# Patient Record
Sex: Male | Born: 1940 | Race: Black or African American | Hispanic: No | Smoking: Former smoker
Health system: Southern US, Community
[De-identification: ages and names within clinical notes are randomized; demographics above are authoritative.]

## PROBLEM LIST (undated history)

## (undated) DIAGNOSIS — I2510 Atherosclerotic heart disease of native coronary artery without angina pectoris: Secondary | ICD-10-CM

## (undated) DIAGNOSIS — I219 Acute myocardial infarction, unspecified: Secondary | ICD-10-CM

## (undated) DIAGNOSIS — R413 Other amnesia: Secondary | ICD-10-CM

## (undated) DIAGNOSIS — R2240 Localized swelling, mass and lump, unspecified lower limb: Secondary | ICD-10-CM

## (undated) DIAGNOSIS — N429 Disorder of prostate, unspecified: Secondary | ICD-10-CM

## (undated) DIAGNOSIS — K219 Gastro-esophageal reflux disease without esophagitis: Secondary | ICD-10-CM

## (undated) DIAGNOSIS — F0390 Unspecified dementia without behavioral disturbance: Secondary | ICD-10-CM

## (undated) DIAGNOSIS — E785 Hyperlipidemia, unspecified: Secondary | ICD-10-CM

## (undated) DIAGNOSIS — Z87891 Personal history of nicotine dependence: Secondary | ICD-10-CM

## (undated) DIAGNOSIS — Z95820 Peripheral vascular angioplasty status with implants and grafts: Secondary | ICD-10-CM

## (undated) HISTORY — DX: Hyperlipidemia, unspecified: E78.5

## (undated) HISTORY — DX: Other amnesia: R41.3

## (undated) HISTORY — DX: Acute myocardial infarction, unspecified: I21.9

## (undated) HISTORY — DX: Peripheral vascular angioplasty status with implants and grafts: Z95.820

## (undated) HISTORY — DX: Atherosclerotic heart disease of native coronary artery without angina pectoris: I25.10

## (undated) HISTORY — PX: CORONARY STENT PLACEMENT: SHX1402

## (undated) HISTORY — DX: Localized swelling, mass and lump, unspecified lower limb: R22.40

## (undated) HISTORY — DX: Personal history of nicotine dependence: Z87.891

## (undated) HISTORY — DX: Gastro-esophageal reflux disease without esophagitis: K21.9

## (undated) HISTORY — DX: Unspecified dementia, unspecified severity, without behavioral disturbance, psychotic disturbance, mood disturbance, and anxiety: F03.90

---

## 1999-06-29 ENCOUNTER — Emergency Department (HOSPITAL_COMMUNITY): Admission: EM | Admit: 1999-06-29 | Discharge: 1999-06-29 | Payer: Self-pay | Admitting: Emergency Medicine

## 2003-07-08 ENCOUNTER — Encounter: Payer: Self-pay | Admitting: Emergency Medicine

## 2003-07-08 ENCOUNTER — Emergency Department (HOSPITAL_COMMUNITY): Admission: EM | Admit: 2003-07-08 | Discharge: 2003-07-08 | Payer: Self-pay | Admitting: Emergency Medicine

## 2003-07-14 ENCOUNTER — Emergency Department (HOSPITAL_COMMUNITY): Admission: EM | Admit: 2003-07-14 | Discharge: 2003-07-14 | Payer: Self-pay | Admitting: Emergency Medicine

## 2004-11-19 ENCOUNTER — Ambulatory Visit: Payer: Self-pay | Admitting: Internal Medicine

## 2005-09-29 ENCOUNTER — Ambulatory Visit: Payer: Self-pay | Admitting: Family Medicine

## 2007-01-04 ENCOUNTER — Ambulatory Visit: Payer: Self-pay | Admitting: Family Medicine

## 2007-02-08 DIAGNOSIS — K219 Gastro-esophageal reflux disease without esophagitis: Secondary | ICD-10-CM

## 2007-10-11 ENCOUNTER — Ambulatory Visit (HOSPITAL_COMMUNITY): Admission: RE | Admit: 2007-10-11 | Discharge: 2007-10-11 | Payer: Self-pay | Admitting: Family Medicine

## 2007-10-11 ENCOUNTER — Ambulatory Visit: Payer: Self-pay | Admitting: Family Medicine

## 2007-10-11 LAB — CONVERTED CEMR LAB
CO2: 28 meq/L (ref 19–32)
Calcium: 10.1 mg/dL (ref 8.4–10.5)
MCHC: 30.5 g/dL (ref 30.0–36.0)
MCV: 91.5 fL (ref 78.0–100.0)
Platelets: 223 10*3/uL (ref 150–400)
Potassium: 4.6 meq/L (ref 3.5–5.3)
RBC: 4.97 M/uL (ref 4.22–5.81)
Sodium: 143 meq/L (ref 135–145)
TSH: 1.445 microintl units/mL (ref 0.350–5.50)
WBC: 7.5 10*3/uL (ref 4.0–10.5)

## 2007-10-16 ENCOUNTER — Encounter: Payer: Self-pay | Admitting: Family Medicine

## 2007-10-17 ENCOUNTER — Encounter: Payer: Self-pay | Admitting: Family Medicine

## 2008-11-05 ENCOUNTER — Ambulatory Visit: Payer: Self-pay | Admitting: Family Medicine

## 2008-11-05 ENCOUNTER — Encounter: Admission: RE | Admit: 2008-11-05 | Discharge: 2008-11-05 | Payer: Self-pay | Admitting: Family Medicine

## 2008-11-06 ENCOUNTER — Telehealth: Payer: Self-pay | Admitting: Family Medicine

## 2008-11-06 ENCOUNTER — Encounter: Payer: Self-pay | Admitting: Family Medicine

## 2008-11-20 ENCOUNTER — Ambulatory Visit: Payer: Self-pay | Admitting: Family Medicine

## 2008-11-20 DIAGNOSIS — Z87891 Personal history of nicotine dependence: Secondary | ICD-10-CM

## 2008-11-20 HISTORY — DX: Personal history of nicotine dependence: Z87.891

## 2008-11-20 LAB — CONVERTED CEMR LAB
Cholesterol: 194 mg/dL (ref 0–200)
HDL: 60 mg/dL (ref >39)
Triglycerides: 76 mg/dL (ref <150)

## 2008-11-21 ENCOUNTER — Encounter: Payer: Self-pay | Admitting: Family Medicine

## 2009-04-16 ENCOUNTER — Ambulatory Visit: Payer: Self-pay | Admitting: Family Medicine

## 2009-07-09 ENCOUNTER — Ambulatory Visit: Payer: Self-pay | Admitting: Family Medicine

## 2009-07-09 DIAGNOSIS — M25569 Pain in unspecified knee: Secondary | ICD-10-CM | POA: Insufficient documentation

## 2009-07-09 DIAGNOSIS — R229 Localized swelling, mass and lump, unspecified: Secondary | ICD-10-CM

## 2009-08-24 ENCOUNTER — Encounter (INDEPENDENT_AMBULATORY_CARE_PROVIDER_SITE_OTHER): Payer: Self-pay | Admitting: General Practice

## 2009-09-17 ENCOUNTER — Encounter: Payer: Self-pay | Admitting: Gastroenterology

## 2009-10-16 ENCOUNTER — Ambulatory Visit: Payer: Self-pay | Admitting: Gastroenterology

## 2009-10-26 ENCOUNTER — Ambulatory Visit: Payer: Self-pay | Admitting: Gastroenterology

## 2009-12-25 ENCOUNTER — Ambulatory Visit: Payer: Self-pay | Admitting: Family Medicine

## 2010-01-18 ENCOUNTER — Encounter: Payer: Self-pay | Admitting: Family Medicine

## 2010-10-01 ENCOUNTER — Ambulatory Visit: Payer: Self-pay | Admitting: Family Medicine

## 2011-01-03 ENCOUNTER — Encounter (INDEPENDENT_AMBULATORY_CARE_PROVIDER_SITE_OTHER): Payer: Self-pay | Admitting: *Deleted

## 2011-01-11 NOTE — Assessment & Plan Note (Signed)
Summary: SINUS INFECTION/CONGESTED X 1 WK/CHAMBLISS' PT/D-LO FATHER-IN...   Vital Signs:  Patient profile:   70 year old male Weight:      140.8 pounds Temp:     98.8 degrees F oral Pulse rate:   75 / minute Pulse rhythm:   regular BP sitting:   118 / 77  (left arm) Cuff size:   regular  Vitals Entered By: Loralee Pacas CMA (December 25, 2009 11:48 AM) CC: congestion Comments pt states that he has pressure in his head and nasal drainage thats green.   CC:  congestion.  History of Present Illness: 1.  congestion--also runny nose, sinus pressure for one week.  starting to feel a little better today.  taking some otc med, but does not remember the name.  no cough, sore throat or fever.  would like flu shot.  Allergies: No Known Drug Allergies  Past History:  Past Medical History: Reviewed history from 10/11/2007 and no changes required. EST approximately 2002  Physical Exam  General:  pleasant, well-groomed Eyes:  normal appearance Ears:  External ear exam shows no significant lesions or deformities.  Otoscopic examination reveals clear canals, tympanic membranes are intact bilaterally without bulging, retraction, inflammation or discharge. Hearing is grossly normal bilaterally. Nose:  mucosal erythema Mouth:  MMM.  Oral mucosa and oropharynx without lesions or exudates.  dentures in place. Neck:  mild shoddy anterior lad Lungs:  Normal respiratory effort, chest expands symmetrically. Lungs are clear to auscultation, no crackles or wheezes. Heart:  Normal rate and regular rhythm. S1 and S2 normal without gallop, murmur, click, rub or other extra sounds. Additional Exam:  vital signs reviewed    Impression & Recommendations:  Problem # 1:  UPPER RESPIRATORY INFECTION (ICD-465.9) Assessment New  supportive care.  see pt instructions.  Orders: [facility]- Est Level  3 (16109)  Patient Instructions: 1)  It was nice to see you today. 2)  Tell Delores that we miss her! 3)  I  think you have a cold.  You should start feeling better in the next few days. 4)  In the meantime, you can take sudafed (pseudoephedrine) for your congestion. 5)  You can take zyrtec (cetirizine) for your runny nose.   6)  Also, drink plenty of fluids. 7)  If you do not feel better in a week, or if your symptoms get worse, make a follow up appointment.   Appended Document: SINUS INFECTION/CONGESTED X 1 WK/CHAMBLISS' PT/D-LO FATHER-IN...     Allergies: No Known Drug Allergies   Other Orders: Influenza Vaccine MCR (60454)   Immunizations Administered:  Influenza Vaccine # 1:    Vaccine Type: Fluvax MCR    Site: right deltoid    Mfr: GlaxoSmithKline    Dose: 0.5 ml    Route: IM    Given by: Loralee Pacas CMA    Exp. Date: 06/10/2010    Lot #: [id number]    VIS given: 07/21/2009  Flu Vaccine Consent Questions:    Do you have a history of severe allergic reactions to this vaccine? no    Any prior history of allergic reactions to egg and/or gelatin? no    Do you have a sensitivity to the preservative Thimersol? no    Do you have a past history of Guillan-Barre Syndrome? no    Do you currently have an acute febrile illness? no    Have you ever had a severe reaction to latex? no    Vaccine information given and explained to patient? yes

## 2011-01-11 NOTE — Miscellaneous (Signed)
Summary: CP  Clinical Lists Changes CP & pain under L arm off & on x 1 wk. nothing makes it better of worse. feels a burning sensation as well in chest area. hx of reflux but takes no meds. he called his dtr in law & asked for an appt. this is something he would not normally do she states. reviewed problem list & told her that he needs to go to ED to be evaluated. she will encourage him to go now.Golden Circle RN  January 18, 2010 11:50 AM

## 2011-01-11 NOTE — Assessment & Plan Note (Signed)
Summary: flu shot/bmc  Nurse Visit   Vital Signs:  Patient profile:   70 year old male Temp:     98.3 degrees F  Vitals Entered By: Theresia Lo RN (October 01, 2010 10:14 AM)  Allergies: No Known Drug Allergies  Immunizations Administered:  Influenza Vaccine # 1:    Vaccine Type: Fluvax MCR    Site: left deltoid    Mfr: GlaxoSmithKline    Dose: 0.5 ml    Given by: Theresia Lo RN    Exp. Date: 06/08/2011    Lot #: [id number]    VIS given: 07/06/10 version given October 01, 2010.  Flu Vaccine Consent Questions:    Do you have a history of severe allergic reactions to this vaccine? no    Any prior history of allergic reactions to egg and/or gelatin? no    Do you have a sensitivity to the preservative Thimersol? no    Do you have a past history of Guillan-Barre Syndrome? no    Do you currently have an acute febrile illness? no    Have you ever had a severe reaction to latex? no    Vaccine information given and explained to patient? yes  Orders Added: 1)  Influenza Vaccine MCR [00025] 2)  Administration Flu vaccine - MCR [G0008]

## 2011-01-13 NOTE — Letter (Signed)
Summary: Generic Letter  [facility]  [address]   [place], [place] [address]   Phone: [phone]  Fax: [phone]     01/03/2011  [address] [place], [place]  [address]  Dear Mr. Rede,  We are happy to let you know that since you are covered under Medicare you are able to have a FREE visit at the [facility] to discuss your HEALTH. This is a new benefit for Medicare.  There will be no co-payment.  At this visit you will meet with Arlys John an expert in wellness and the health coach at our clinic.  At this visit we will discuss ways to keep you healthy and feeling well.  This visit will not replace your regular doctor visit and we cannot refill medications.     You will need to plan to be here at least one hour to talk about your medical history, your current status, review all of your medications, and discuss your future plans for your health.  This information will be entered into your record for your doctor to have and review.  If you are interested in staying healthy, this type of visit can help.  Please call the office at: [phone], to schedule a "Medicare Wellness Visit".  The day of the visit you should bring in all of your medications, including any vitamins, herbs, over the counter products you take.  Make a list of all the other doctors that you see, so we know who they are. If you have any other health documents please bring them.  We look forward to helping you stay healthy.   Sincerely,   [facility]  iAWV

## 2011-01-14 ENCOUNTER — Telehealth: Payer: Self-pay | Admitting: Family Medicine

## 2011-01-14 ENCOUNTER — Inpatient Hospital Stay (HOSPITAL_COMMUNITY)
Admission: RE | Admit: 2011-01-14 | Discharge: 2011-01-14 | Disposition: A | Discharge status: Home / Self Care | Payer: Medicare Other | Source: Ambulatory Visit | Attending: Emergency Medicine | Admitting: Emergency Medicine

## 2011-01-14 DIAGNOSIS — H81399 Other peripheral vertigo, unspecified ear: Secondary | ICD-10-CM

## 2011-01-19 NOTE — Progress Notes (Signed)
Summary: triage  Phone Note Call from Patient Call back at [phone]   Caller: delores Summary of Call: head swimming h/a - bp elevated Initial call taken by: De Nurse,  January 14, 2011 2:15 PM  Follow-up for Phone Call        Deloris states he has been not well this week with HA & elevated bp. does not have any readings. asked her to get him to UC today as he needs to be seen right away & we are out of appts. she agreed with the plan Follow-up by: Golden Circle RN,  January 14, 2011 2:21 PM

## 2011-02-03 ENCOUNTER — Ambulatory Visit: Payer: Medicare Other | Admitting: Family Medicine

## 2011-02-09 ENCOUNTER — Ambulatory Visit (INDEPENDENT_AMBULATORY_CARE_PROVIDER_SITE_OTHER): Payer: Medicare Other | Admitting: Family Medicine

## 2011-02-09 ENCOUNTER — Ambulatory Visit: Payer: Medicare Other | Admitting: Family Medicine

## 2011-02-09 ENCOUNTER — Encounter: Payer: Self-pay | Admitting: Family Medicine

## 2011-02-09 VITALS — BP 136/78 | HR 81 | Temp 98.6°F | Ht 63.5 in | Wt 137.3 lb

## 2011-02-09 DIAGNOSIS — R42 Dizziness and giddiness: Secondary | ICD-10-CM

## 2011-02-09 MED ORDER — MECLIZINE HCL 25 MG PO TABS: 25.0000 mg | ORAL_TABLET | Freq: Three times a day (TID) | ORAL | Status: DC | PRN

## 2011-02-09 MED ORDER — MECLIZINE HCL 25 MG PO TABS: 25.0000 mg | ORAL_TABLET | Freq: Three times a day (TID) | ORAL | Status: AC | PRN

## 2011-02-09 NOTE — Patient Instructions (Signed)
It was nice to meet you today!  You have vertigo.  I sent you prescriptions to your pharmacy.
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
## 2011-02-09 NOTE — Assessment & Plan Note (Signed)
Vertigo induced when patient instructed to move head side to side. Discussed Dx, Tx, and prognosis. Follow up in 2-3 weeks if not improving. No red flags today. No need for further evaluation at this time.

## 2011-03-02 ENCOUNTER — Encounter: Payer: Self-pay | Admitting: Family Medicine

## 2011-03-02 ENCOUNTER — Ambulatory Visit (INDEPENDENT_AMBULATORY_CARE_PROVIDER_SITE_OTHER): Payer: Medicare Other | Admitting: Family Medicine

## 2011-03-02 DIAGNOSIS — F17200 Nicotine dependence, unspecified, uncomplicated: Secondary | ICD-10-CM

## 2011-03-02 DIAGNOSIS — R42 Dizziness and giddiness: Secondary | ICD-10-CM

## 2011-03-02 NOTE — Assessment & Plan Note (Signed)
Most consistent with  Peripheral vertigo.  No signs of intracranial lesions or stroke. Continue current treatment

## 2011-03-02 NOTE — Patient Instructions (Signed)
I think your inner ear is causing the dizziness If it is not gone in 2 months or getting worse or you have any weakness or vision changes call us immediately If you have chest pain when you are working hard or if you have pain that does not get better with coke or antiacids then call us Consider stop smoking  Come back in one year

## 2011-03-02 NOTE — Progress Notes (Signed)
  Subjective:    Patient ID: MALIQ PILLEY, male    DOB: [birth year], 70 y.o.   MRN: [id number]  HPI  Vertigo Has been going on for a month or so. Saw Dr Earlene Plater I reviewed this note.  He is generally feeling better.  Has episodes of vertigo but are lessening. No syncope or focal weakness or visual changes.  Takes meclizine a few time a week.  Tobacco Smokes 1 ppd.  Would like to stop. Has tried nicotine gum in past.   Realizes how much money it costs   Review of Systems  See above     Objective:   Physical Exam Heart:  Normal rate and regular rhythm. S1 and S2 normal without gallop, murmur, click, rub or other extra sounds Eye - Pupils Equal Round Reactive to light, Extraocular movements intact, fundi without hemorrhage or visible lesions Lungs:  Normal respiratory effort, chest expands symmetrically. Lungs are clear to auscultation, no crackles or wheezes. Neurologic exam Cn 2-7 intact; Able to walk on heels and shins.  Normal grip and upper extremity strength Balance Romberg normal,  Ears:  External ear exam shows no significant lesions or deformities.  Otoscopic examination reveals clear canals, tympanic membranes are intact bilaterally without bulging, retraction, inflammation or discharge. Hearing is grossly normal bilaterall     Assessment & Plan:

## 2011-03-02 NOTE — Assessment & Plan Note (Addendum)
Discussed his current use.  Went over reasons to stop and cost of buying cigarrettes.  Also discussed approaches to stop

## 2011-06-28 ENCOUNTER — Ambulatory Visit (INDEPENDENT_AMBULATORY_CARE_PROVIDER_SITE_OTHER): Payer: Medicare Other | Admitting: Family Medicine

## 2011-06-28 ENCOUNTER — Encounter: Payer: Self-pay | Admitting: Family Medicine

## 2011-06-28 VITALS — BP 106/65 | HR 55 | Temp 98.5°F | Ht 66.0 in | Wt 137.8 lb

## 2011-06-28 DIAGNOSIS — R229 Localized swelling, mass and lump, unspecified: Secondary | ICD-10-CM

## 2011-06-28 DIAGNOSIS — F17200 Nicotine dependence, unspecified, uncomplicated: Secondary | ICD-10-CM

## 2011-06-28 DIAGNOSIS — R2240 Localized swelling, mass and lump, unspecified lower limb: Secondary | ICD-10-CM | POA: Insufficient documentation

## 2011-06-28 MED ORDER — BUPROPION HCL ER (SMOKING DET) 150 MG PO TB12: 150.0000 mg | ORAL_TABLET | Freq: Two times a day (BID) | ORAL | Status: AC

## 2011-06-28 NOTE — Progress Notes (Signed)
  Subjective:    Patient ID: Dale [name], male    DOB: Jun 24, 1941, 70 y.o.   MRN: [id number]  HPI  Mass R Leg Has had a swelling in the front of his shin for many years.  Usually does not bother him.  Over the last few days it has been painful, sharp shooting severe pain that comes and goes worse when he squeezes it  Certain way but not all the time.  No redness or discharge or fever or other pain in his leg.  Is somewhat bigger than usual  Smoking Would like to stop.  His friends stopped with Zyban.  He smokes 1ppd of menthol cigarettes.  Has used nicotine gum before. Feels it is bad for his health and costs too much.  Review of Symptoms - see HPI  PMH - Smoking status noted.     Review of Systems     Objective:   Physical Exam  R Anterior mid shin - 1.5 x 1.5 cm superficial soft mass that seems to be separate from underlying muscle. Is not tender to palpation.  No pain with ROM of hip, knee, ankle or SLR.  Is tender sometimes when he moves it laterally.  Can walk on his toes and heels without pain       Assessment & Plan:

## 2011-06-28 NOTE — Patient Instructions (Signed)
We will refer you to a surgeon for removal of the mass  If it gets really swollen or painful call us  Randie Heinz you want to stop smoking  Take the Zyban (buproprion) 1 tab twice daily.  After 2 weeks stop smoking and continue to take the medicine for another 2-4 weeks.  To get ready to stop Choose a quit date that is important to you Go a few hours without smoking Change to a brand you don't like Find a substitute to use when you feel like a cigarette On your Quit date - throw away all your cigarettes, ashtrays  Tell all your friends and enemies you are stopping

## 2011-06-28 NOTE — Assessment & Plan Note (Signed)
Unsure of etiology or why is painful now.  No signs of acute infection.  Will refer for evaluation and probable removal

## 2011-06-28 NOTE — Assessment & Plan Note (Addendum)
Interested in stopping.  Counseled on approach.  Will Rx Zyban.  No histrory of Sz

## 2011-06-29 ENCOUNTER — Ambulatory Visit: Payer: No Typology Code available for payment source | Admitting: Family Medicine

## 2011-07-15 ENCOUNTER — Encounter (INDEPENDENT_AMBULATORY_CARE_PROVIDER_SITE_OTHER): Payer: Self-pay | Admitting: General Surgery

## 2011-07-15 ENCOUNTER — Ambulatory Visit (INDEPENDENT_AMBULATORY_CARE_PROVIDER_SITE_OTHER): Payer: Medicare Other | Admitting: General Surgery

## 2011-07-15 VITALS — BP 112/66 | HR 58 | Temp 96.5°F | Ht 65.0 in | Wt 134.2 lb

## 2011-07-15 DIAGNOSIS — L720 Epidermal cyst: Secondary | ICD-10-CM

## 2011-07-15 DIAGNOSIS — L723 Sebaceous cyst: Secondary | ICD-10-CM

## 2011-07-15 NOTE — Progress Notes (Signed)
Dale [name] is a 70 y.o. male.    Chief Complaint  Patient presents with  . Other    new pt-eval of shin mass    HPI HPI The patient was referred for evaluation of a mass on his right shin which he states has been present all his life. He has not noticed any certain changes over this time period until recently when he began having some sharp, shooting pains in the area of the mass and he states that it has increased in size. It was tender to touch and swollen but he denies any fevers or chills, redness, or discharge. He denies any other associated symptoms or relieving or exacerbating factors. The pain continued for 2 days and then spontaneously resolved.  Past Medical History  Diagnosis Date  . Leg mass     rt    History reviewed. No pertinent past surgical history.  History reviewed. No pertinent family history.  Social History History  Substance Use Topics  . Smoking status: Current Everyday Smoker -- 1.0 packs/day for 1 years    Types: Cigarettes  . Smokeless tobacco: Not on file  . Alcohol Use: Yes    No Known Allergies  Current Outpatient Prescriptions  Medication Sig Dispense Refill  . fish oil-omega-3 fatty acids 1000 MG capsule Take 1 g by mouth daily.        . Multiple Vitamin (MULTI-VITAMIN PO) Take by mouth daily.        Marland Kitchen buPROPion (ZYBAN) 150 MG 12 hr tablet Take 1 tablet (150 mg total) by mouth 2 (two) times daily.  60 tablet  1    Review of Systems Review of Systems  Constitutional: Negative.   HENT: Negative.   Eyes: Negative.   Respiratory: Negative.   Cardiovascular: Negative.   Gastrointestinal: Negative.   Genitourinary: Negative.   Musculoskeletal: Negative.   Skin: Negative.   Neurological: Negative.   Endo/Heme/Allergies: Negative.     Physical Exam Physical Exam  Constitutional: He is oriented to person, place, and time. He appears well-developed and well-nourished. No distress.  HENT:  Head: Normocephalic and atraumatic.    Mouth/Throat: No oropharyngeal exudate.  Eyes: Conjunctivae and EOM are normal. Pupils are equal, round, and reactive to light. Right eye exhibits no discharge. Left eye exhibits no discharge. No scleral icterus.  Neck: Normal range of motion. Neck supple. No tracheal deviation present.  Cardiovascular: Normal rate, regular rhythm and normal heart sounds.   Respiratory: Effort normal and breath sounds normal. No stridor. No respiratory distress. He has no wheezes.  GI: Soft. Bowel sounds are normal. He exhibits no distension and no mass. There is no tenderness. There is no rebound and no guarding.  Musculoskeletal: Normal range of motion. He exhibits no edema and no tenderness.  Neurological: He is alert and oriented to person, place, and time. He has normal reflexes.  Skin: Skin is warm and dry. No rash noted. He is not diaphoretic. No erythema. No pallor.       3cm x 3cm mobile mass on anterior right shin, central skin punctum consistent with epidermal inclusion cyst, nontender and no evidence of infection today. No other significant lesions.  Psychiatric: He has a normal mood and affect. His behavior is normal. Judgment and thought content normal.     Blood pressure 112/66, pulse 58, temperature 96.5 F (35.8 C), height 5\' 5"  (1.651 m), weight 134 lb 4 oz (60.895 kg).  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 This is nontender currently and has been present his whole life. I discussed with him the natural history of sebaceous cysts and discussed the risks of possible infection. I offered excision versus continued observation and he is currently undecided. I gave him the contact information and recommended that he think about the options and if he decides to have this removed for treatment and official diagnosis then he can call me back and we will schedule this at his convenience.  Otherwise, if he decides to  pursue observation, I did discuss with him the possibility of infection within the cyst which would require incision and drainage, wound care, and subsequent formal excision for definitive treatment. He expressed understanding.  Dale [name] 07/15/2011, 11:52 AM

## 2011-10-18 ENCOUNTER — Encounter: Payer: Self-pay | Admitting: Home Health Services

## 2011-10-31 ENCOUNTER — Ambulatory Visit (INDEPENDENT_AMBULATORY_CARE_PROVIDER_SITE_OTHER): Payer: Medicare Other | Admitting: Family Medicine

## 2011-10-31 ENCOUNTER — Encounter: Payer: Self-pay | Admitting: Family Medicine

## 2011-10-31 VITALS — BP 120/70 | HR 73 | Temp 98.1°F | Ht 64.0 in | Wt 136.0 lb

## 2011-10-31 DIAGNOSIS — Z23 Encounter for immunization: Secondary | ICD-10-CM

## 2011-10-31 MED ORDER — NAPROXEN 500 MG PO TABS: 500.0000 mg | ORAL_TABLET | Freq: Two times a day (BID) | ORAL | Status: DC

## 2011-10-31 MED ORDER — BUPROPION HCL ER (SR) 150 MG PO TB12: 150.0000 mg | ORAL_TABLET | Freq: Two times a day (BID) | ORAL | Status: DC
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
## 2012-08-15 ENCOUNTER — Ambulatory Visit: Payer: Self-pay | Admitting: Family Medicine

## 2012-08-22 ENCOUNTER — Encounter: Payer: Self-pay | Admitting: Family Medicine

## 2012-08-22 ENCOUNTER — Ambulatory Visit (INDEPENDENT_AMBULATORY_CARE_PROVIDER_SITE_OTHER): Payer: PRIVATE HEALTH INSURANCE | Admitting: Family Medicine

## 2012-08-22 VITALS — BP 113/73 | HR 50 | Ht 64.0 in | Wt 145.0 lb

## 2012-08-22 DIAGNOSIS — Z23 Encounter for immunization: Secondary | ICD-10-CM

## 2012-08-22 DIAGNOSIS — Z87891 Personal history of nicotine dependence: Secondary | ICD-10-CM

## 2012-08-22 DIAGNOSIS — I498 Other specified cardiac arrhythmias: Secondary | ICD-10-CM

## 2012-08-22 NOTE — Assessment & Plan Note (Signed)
In Nov 2012

## 2012-08-22 NOTE — Progress Notes (Signed)
  Subjective:    Patient ID: Dale [name], male    DOB: 09-Jan-1941, 71 y.o.   MRN: [id number]  HPI  For Physical  Feels well.    No complaints  Stopped smoking last November Wil have an occasional e cigarette.  ROS  General:  Negative for nexplained weight loss, fever Skin: Negative for new or changing mole, sore that won't heal HEENT: Negative for trouble hearing, trouble seeing, ringing in ears, mouth sores, hoarseness, change in voice, dysphagia. CV:  Negative for chest pain, dyspnea, edema, palpitations Resp: Negative for cough, dyspnea, hemoptysis GI: Negative for nausea, vomiting, diarrhea, constipation, abdominal pain, melena, hematochezia. GU: Negative for dysuria, incontinence,  hematuria,  penile discharge, polyuria, sexual difficulty, lumps in testicle or breasts MSK: Negative for muscle cramps or aches, joint pain or swelling Neuro: Negative for headaches, weakness, numbness, dizziness, passing out/fainting Psych: Negative for depression, anxiety, memory problems  Does notice a decrease in urinary FOS and nocturia 1-2 times a night does not interfere with his fucniton  Review of Systems     Objective:   Physical Exam  Neck:  No deformities, thyromegaly, masses, or tenderness noted.   Supple with full range of motion without pain. Heart - Regular rate and rhythm.  No murmurs, gallops or rubs.    Lungs:  Normal respiratory effort, chest expands symmetrically. Lungs are clear to auscultation, no crackles or wheezes. Abdomen: soft and non-tender without masses, organomegaly or hernias noted.  No guarding or rebound Extremities:  No cyanosis, edema, or deformity noted with good range of motion of all major joints.   Has the 2.5x2 soft mass on anterior left shin - unchanged from previously Skin:  Intact without suspicious lesions or rashes       Assessment & Plan:

## 2012-08-22 NOTE — Patient Instructions (Addendum)
Call me if passing your water is causing more problems.  Beware of certain cold medications - check to see if they will affect your Prostate  Congrats on stopping smoking  Check on your shingles shot

## 2012-08-22 NOTE — Assessment & Plan Note (Signed)
No symptoms 

## 2012-12-26 ENCOUNTER — Encounter: Payer: Self-pay | Admitting: Family Medicine

## 2012-12-26 ENCOUNTER — Ambulatory Visit (INDEPENDENT_AMBULATORY_CARE_PROVIDER_SITE_OTHER): Payer: Medicare Other | Admitting: Family Medicine

## 2012-12-26 VITALS — BP 123/79 | HR 71 | Temp 98.3°F | Ht 64.0 in | Wt 151.0 lb

## 2012-12-26 DIAGNOSIS — R109 Unspecified abdominal pain: Secondary | ICD-10-CM | POA: Insufficient documentation

## 2012-12-26 LAB — CBC
HCT: 44.8 % (ref 39.0–52.0)
Hemoglobin: 14.6 g/dL (ref 13.0–17.0)
MCH: 28.5 pg (ref 26.0–34.0)
MCHC: 32.6 g/dL (ref 30.0–36.0)
MCV: 87.3 fL (ref 78.0–100.0)
RBC: 5.13 MIL/uL (ref 4.22–5.81)

## 2012-12-26 MED ORDER — SILDENAFIL CITRATE 100 MG PO TABS: 100.0000 mg | ORAL_TABLET | ORAL | Status: DC | PRN

## 2012-12-26 NOTE — Patient Instructions (Signed)
I think your abdomen pain will improve slowly.  Do not use Aleve or aspirin or drink alcohol.  You can take the Pepto Bismol as needed  If you have any severe pain or bleeding or fever or are not better in 2 weeks then come back  I will call you if your tests are not good.  Otherwise I will send you a letter.  If you do not hear from me with in 2 weeks please call our office.

## 2012-12-26 NOTE — Assessment & Plan Note (Signed)
New but seems to be improving without any red flags.  Will check a cbc to rule out bleeding ulcer or occult infection.  Will montior

## 2012-12-26 NOTE — Progress Notes (Signed)
  Subjective:    Patient ID: Dale [name], male    DOB: 1941-11-25, 72 y.o.   MRN: [id number]  HPI  Abdomen Rectal discomfort For last few months intermittently tender area around rectum.  No bleeding or severe pain.  Over last few days mild diffuse abdomen pain.  No nausea or vomiting or frank diarrhea but his bowel movements change an little in hardness.  No fever, No weight loss.  Takes occasional aleve and drinks moonshine and coffee and took some new little candies (cant remember name) that he thinks might have caused his symptoms.  No dysuria or frequency   PMH - had colonoscopy in 2010   Review of Symptoms - see HPI  PMH - Smoking status noted.     Review of Systems     Objective:   Physical Exam  No acute distress Abdomen: soft and non-tender without masses, organomegaly or hernias noted.  No guarding or rebound Rectal: normal size firm prostate without masses or irregularities.  No masses or tender areas or fissures.  Normal color stool   Back - Normal skin, Spine with normal alignment and no deformity.  No tenderness to vertebral process palpation.  Paraspinous muscles are not tender and without spasm.   Range of motion is full at neck and lumbar sacral regions       Assessment & Plan:

## 2012-12-27 ENCOUNTER — Encounter: Payer: Self-pay | Admitting: Family Medicine

## 2013-05-27 ENCOUNTER — Ambulatory Visit (INDEPENDENT_AMBULATORY_CARE_PROVIDER_SITE_OTHER): Payer: Medicare PPO | Admitting: Family Medicine

## 2013-05-27 ENCOUNTER — Encounter: Payer: Self-pay | Admitting: Family Medicine

## 2013-05-27 VITALS — BP 128/76 | HR 52 | Temp 97.8°F | Wt 147.0 lb

## 2013-05-27 DIAGNOSIS — Z13220 Encounter for screening for lipoid disorders: Secondary | ICD-10-CM

## 2013-05-27 LAB — LIPID PANEL
LDL Cholesterol: 106 mg/dL — ABNORMAL HIGH (ref 0–99)
Total CHOL/HDL Ratio: 2.9 Ratio
VLDL: 12 mg/dL (ref 0–40)

## 2013-05-27 MED ORDER — SILDENAFIL CITRATE 100 MG PO TABS: 100.0000 mg | ORAL_TABLET | ORAL | Status: DC | PRN

## 2013-05-27 NOTE — Progress Notes (Signed)
  Subjective:    Patient ID: Dale [name], male    DOB: [birth year], 72 y.o.   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  He is very active  Patient reports no  vision/ hearing changes,anorexia, weight change, fever ,adenopathy, persistant / recurrent hoarseness, swallowing issues, chest pain, edema,persistant / recurrent cough, hemoptysis, dyspnea(rest, exertional, paroxysmal nocturnal), gastrointestinal  bleeding (melena, rectal bleeding), abdominal pain, excessive heart burn, GU symptoms(dysuria, hematuria, pyuria, voiding/incontinence  Issues) syncope, focal weakness, severe memory loss, concerning skin lesions, depression, anxiety, abnormal bruising/bleeding, major joint swelling.      Review of Systems     Objective:   Physical Exam  Alert no acute distress Neck:  No deformities, thyromegaly, masses, or tenderness noted.   Supple with full range of motion without pain. Heart - Regular rate and rhythm.  No murmurs, gallops or rubs.    Lungs:  Normal respiratory effort, chest expands symmetrically. Lungs are clear to auscultation, no crackles or wheezes. Abdomen: soft and non-tender without masses, organomegaly or hernias noted.  No guarding or rebound Extremities:  No cyanosis, edema, or noted with good range of motion of all major joints.  Does have 3 cm soft mass on anter R shin - chronic         Assessment & Plan:   Normal Exam Discussed Shingles Shot

## 2013-05-27 NOTE — Patient Instructions (Addendum)
I will call you if your tests are not good.  Otherwise I will send you a letter.  If you do not hear from me with in 2 weeks please call our office.     Bring in a physical form if you need it filled out  Call you insurance to see about the [employer]

## 2013-05-30 ENCOUNTER — Encounter (HOSPITAL_COMMUNITY): Payer: Self-pay | Admitting: *Deleted

## 2013-05-30 ENCOUNTER — Encounter: Payer: Self-pay | Admitting: Family Medicine

## 2013-05-30 DIAGNOSIS — Y9269 Other specified industrial and construction area as the place of occurrence of the external cause: Secondary | ICD-10-CM | POA: Insufficient documentation

## 2013-05-30 DIAGNOSIS — Y9389 Activity, other specified: Secondary | ICD-10-CM | POA: Insufficient documentation

## 2013-05-30 DIAGNOSIS — Z87891 Personal history of nicotine dependence: Secondary | ICD-10-CM | POA: Insufficient documentation

## 2013-05-30 DIAGNOSIS — X58XXXA Exposure to other specified factors, initial encounter: Secondary | ICD-10-CM | POA: Insufficient documentation

## 2013-05-30 DIAGNOSIS — S62609A Fracture of unspecified phalanx of unspecified finger, initial encounter for closed fracture: Secondary | ICD-10-CM | POA: Insufficient documentation

## 2013-05-30 NOTE — ED Notes (Addendum)
Pt states that he "bammed" his finger yesterday between a stick and a piece of iron. Pt has blood under the nail of his ring finger on his right hand.

## 2013-05-31 ENCOUNTER — Emergency Department (HOSPITAL_COMMUNITY)
Admission: EM | Admit: 2013-05-31 | Discharge: 2013-05-31 | Disposition: A | Discharge status: Home / Self Care | Payer: Medicare PPO | Attending: Emergency Medicine | Admitting: Emergency Medicine

## 2013-05-31 ENCOUNTER — Telehealth (HOSPITAL_COMMUNITY): Payer: Self-pay | Admitting: Emergency Medicine

## 2013-05-31 ENCOUNTER — Emergency Department (HOSPITAL_COMMUNITY): Payer: Medicare PPO

## 2013-05-31 DIAGNOSIS — S62604A Fracture of unspecified phalanx of right ring finger, initial encounter for closed fracture: Secondary | ICD-10-CM

## 2013-05-31 MED ORDER — TRAMADOL HCL 50 MG PO TABS: 50.0000 mg | ORAL_TABLET | Freq: Four times a day (QID) | ORAL | Status: DC | PRN

## 2013-05-31 NOTE — ED Provider Notes (Signed)
History     CSN: [id number]  Arrival date & time 05/30/13  2345   None     Chief Complaint  Patient presents with  . Finger Injury    (Consider location/radiation/quality/duration/timing/severity/associated sxs/prior treatment) HPI History provided by pt.   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  Has had severe pain at tip of finger ever since.  Aggravated by ROM of DIP joint. No associated paresthesias.  Past Medical History  Diagnosis Date  . Leg mass     rt    History reviewed. No pertinent past surgical history.  History reviewed. No pertinent family history.  History  Substance Use Topics  . Smoking status: Former Smoker -- 1.00 packs/day for 1 years    Types: Cigarettes    Quit date: 10/23/2011  . Smokeless tobacco: Not on file  . Alcohol Use: Yes      Review of Systems  All other systems reviewed and are negative.    Allergies  Review of patient's allergies indicates no known allergies.  Home Medications   Current Outpatient Rx  Name  Route  Sig  Dispense  Refill  . aspirin 325 MG tablet   Oral   Take 325 mg by mouth every 6 (six) hours as needed for pain.          . sildenafil (VIAGRA) 100 MG tablet   Oral   Take 1 tablet (100 mg total) by mouth as needed.   5 tablet   3   . traMADol (ULTRAM) 50 MG tablet   Oral   Take 1 tablet (50 mg total) by mouth every 6 (six) hours as needed for pain.   15 tablet   0     BP 120/79  Pulse 68  Temp(Src) 98 F (36.7 C) (Oral)  Resp 15  SpO2 95%  Physical Exam  Nursing note and vitals reviewed. Constitutional: He is oriented to person, place, and time. He appears well-developed and well-nourished. No distress.  HENT:  Head: Normocephalic and atraumatic.  Eyes:  Normal appearance  Neck: Normal range of motion.  Pulmonary/Chest: Effort normal.  Musculoskeletal: Normal range of motion.  Subungal hematoma of R 4th finger.  Mild edema and mod-sev tenderness of  pad of finger.  Pain w/ passive flexion of DIP joint.  Distal sensation intact.  Neurological: He is alert and oriented to person, place, and time.  Psychiatric: He has a normal mood and affect. His behavior is normal.    ED Course  Procedures (including critical care time)  Labs Reviewed - No data to display Dg Finger Ring Right  05/31/2013   *RADIOLOGY REPORT*  Clinical Data: Right ring finger crush injury at nail  RIGHT RING FINGER 2+V  Comparison: None.  Findings: The joints are aligned.  There is a small linear lucency in the tuft of the distal phalanx, consistent with a non-displaced acute fracture.  There is adjacent soft tissue swelling.  IMPRESSION: Nondisplaced tuft fracture of the distal phalanx of the right finger.   Original Report Authenticated By: Britta Mccreedy, M.D.     1. Fracture of phalanx of right ring finger, closed, initial encounter       MDM  [phone] M presents w/ tuft fx of distal phalanx of R fourth finger.  Nursing staff splinted, I prescribed ultram, recommended ice/elevation and referred to Hand.  Return precautions discussed.         Otilio Miu, PA-C 05/31/13 [id number]

## 2013-05-31 NOTE — ED Provider Notes (Signed)
Medical screening examination/treatment/procedure(s) were performed by non-physician practitioner and as supervising physician I was immediately available for consultation/collaboration.   Adya Wirz W Jacqulin Brandenburger, MD 05/31/13 0809 

## 2013-09-18 ENCOUNTER — Encounter: Payer: Self-pay | Admitting: Family Medicine

## 2013-09-18 ENCOUNTER — Ambulatory Visit: Payer: Medicare PPO | Admitting: Family Medicine

## 2013-09-18 ENCOUNTER — Ambulatory Visit (INDEPENDENT_AMBULATORY_CARE_PROVIDER_SITE_OTHER): Payer: Medicare PPO | Admitting: Family Medicine

## 2013-09-18 VITALS — BP 146/84 | HR 57 | Temp 97.7°F | Wt 150.0 lb

## 2013-09-18 DIAGNOSIS — M25512 Pain in left shoulder: Secondary | ICD-10-CM

## 2013-09-18 DIAGNOSIS — Z23 Encounter for immunization: Secondary | ICD-10-CM

## 2013-09-18 DIAGNOSIS — M25519 Pain in unspecified shoulder: Secondary | ICD-10-CM

## 2013-09-18 MED ORDER — ZOSTER VACCINE LIVE 19400 UNT/0.65ML ~~LOC~~ SOLR: 0.6500 mL | Freq: Once | SUBCUTANEOUS | Status: DC

## 2013-09-18 NOTE — Assessment & Plan Note (Signed)
Most consistent with rotator cuff pathology.  No signs of infection or tumor.  Encourage moderate activity and otc medications with follow up if not tolerable or worsening

## 2013-09-18 NOTE — Progress Notes (Signed)
  Subjective:    Patient ID: Dale [name], male    DOB: 1941/11/06, 72 y.o.   MRN: [id number]  HPI JOINT PAIN  Left Shoulder Location: posterior and anterior shoulder.   Course:  For last few months worse at night and when awakens gets better during day.  Does not keep him from performing any activities just bothersome Worse with:  movement Better with:  Rest - has not taken any medications  Trauma: no Swelling:  no Locking:  no Other Joints involved:  no Rash: no Fever:  No  Review of Symptoms - see HPI  PMH - Smoking status noted.  Still not !     Review of Systems     Objective:   Physical Exam Alert no acute distress Left shoulder - FROM with pain at extremes.  Pain worse with Roator cuff testing.  Not weak Distal sensation pulse and strength normal        Assessment & Plan:

## 2013-09-18 NOTE — Patient Instructions (Signed)
Good to see you today!  Thanks for coming in.  You have rotator cuff tendonitis  Take tylenol or naprosyn ibuprofen for pain as you need it  If it gets really painful we should get an xray and perhaps inject  Call your insurance company about the shingle shot - I sent the Rx to your drug store  Callao you are not smoking

## 2013-10-22 ENCOUNTER — Encounter: Payer: Self-pay | Admitting: Family Medicine

## 2013-10-22 ENCOUNTER — Telehealth: Payer: Self-pay | Admitting: Family Medicine

## 2013-10-22 ENCOUNTER — Ambulatory Visit
Admission: RE | Admit: 2013-10-22 | Discharge: 2013-10-22 | Disposition: A | Discharge status: Home / Self Care | Payer: Commercial Managed Care - HMO | Source: Ambulatory Visit | Attending: Family Medicine | Admitting: Family Medicine

## 2013-10-22 ENCOUNTER — Ambulatory Visit (INDEPENDENT_AMBULATORY_CARE_PROVIDER_SITE_OTHER): Payer: Medicare PPO | Admitting: Family Medicine

## 2013-10-22 VITALS — BP 130/75 | HR 80 | Temp 98.7°F | Ht 64.0 in | Wt 145.8 lb

## 2013-10-22 DIAGNOSIS — R509 Fever, unspecified: Secondary | ICD-10-CM

## 2013-10-22 DIAGNOSIS — J189 Pneumonia, unspecified organism: Secondary | ICD-10-CM

## 2013-10-22 MED ORDER — AZITHROMYCIN 250 MG PO TABS: ORAL_TABLET | ORAL | Status: DC

## 2013-10-22 NOTE — Progress Notes (Signed)
  Subjective:    Patient ID: Dale [name], male    DOB: Apr 04, 1941, 72 y.o.   MRN: [id number]  HPI  72 year old M presents for evaluations of fever and congestion. He notes 3 days of subjective fever and chills. These are accompanied by chest congestion and nasal drainage. He denies any myalgias, chest pain or shortness of breath. He notes the symptoms were mildly improved by Alka-Seltzer and Goody's powder. There are no known sick contacts at home.    Past medical history - negative for pneumonia or lung disease  Vaccination history - patient has not received pneumonia vaccine  Review of Systems     Objective:   Physical Exam BP 130/75  Pulse 80  Temp(Src) 98.7 F (37.1 C) (Oral)  Ht 5\' 4"  (1.626 m)  Wt 145 lb 12.8 oz (66.134 kg)  BMI 25.01 kg/m2  Gen.: elderly African American male, non-ill appearing, pleasant and conversant HEENT: oropharynx clear moist, no cervical or submental lymphadenopathy, neck supple without meningismus Cardiovascular: regular rate and rhythm, no murmurs Pulmonary: normal breathing with Rales in posterior lung fields bilaterally, no wheezes or rhonchi Skin: warm and dry, no rashes      Assessment & Plan:

## 2013-10-22 NOTE — Telephone Encounter (Signed)
Patient called and informed of the diagnosis of left lower lobe pneumonia. He was also informed that he'll need to take antibiotics for 5 days. He acknowledged understanding and was agreeable to this plan. He also stated he would return to clinic if he had worsening fever or shortness of breath.

## 2013-10-22 NOTE — Assessment & Plan Note (Signed)
Assessment: most likely etiology is viral respiratory illness, but given his age and lack of vaccination against pneumococcus as well as findings on physical exam I will order a chest x-ray to rule out community acquired pneumonia Plan:  - Chest x-ray, treat accordingly

## 2013-10-22 NOTE — Patient Instructions (Signed)
Dale [name],   It was nice to meet you today. I think that your fever and congestion come from a virus, like a cold. , I would like to be sure he did not have a pneumonia. Therefore, we will get a chest x-ray. I will call you and let you know if we need to push on antibiotics. If this is a virus, then he should get better within 7 days. Please followup if you have worsening congestion or shortness of breath. Also please followup when you're improving to get a pneumonia vaccine.  Sincerely,   Dr. Clinton Sawyer

## 2013-10-25 ENCOUNTER — Telehealth: Payer: Self-pay | Admitting: Family Medicine

## 2013-10-25 DIAGNOSIS — J189 Pneumonia, unspecified organism: Secondary | ICD-10-CM

## 2013-10-25 MED ORDER — AZITHROMYCIN 250 MG PO TABS: ORAL_TABLET | ORAL | Status: DC

## 2013-10-25 NOTE — Telephone Encounter (Signed)
Please let him know i sent it in  [place]

## 2013-10-25 NOTE — Telephone Encounter (Signed)
Pt called because he lost his z-pak. He took 2 days of this and now has miss placed the rest. Can we call in another for him. jw

## 2013-10-25 NOTE — Telephone Encounter (Signed)
I called and told it was sent.  Also that he should have a follow up chest xray in 6 weeks

## 2013-12-25 ENCOUNTER — Ambulatory Visit (INDEPENDENT_AMBULATORY_CARE_PROVIDER_SITE_OTHER): Payer: Medicare PPO | Admitting: Family Medicine

## 2013-12-25 ENCOUNTER — Ambulatory Visit: Payer: Medicare Other | Admitting: Family Medicine

## 2013-12-25 ENCOUNTER — Encounter: Payer: Self-pay | Admitting: Family Medicine

## 2013-12-25 ENCOUNTER — Ambulatory Visit
Admission: RE | Admit: 2013-12-25 | Discharge: 2013-12-25 | Disposition: A | Discharge status: Home / Self Care | Payer: Commercial Managed Care - HMO | Source: Ambulatory Visit | Attending: Family Medicine | Admitting: Family Medicine

## 2013-12-25 VITALS — BP 139/80 | HR 75 | Temp 98.6°F | Ht 64.0 in | Wt 150.0 lb

## 2013-12-25 DIAGNOSIS — J189 Pneumonia, unspecified organism: Secondary | ICD-10-CM

## 2013-12-25 NOTE — Patient Instructions (Signed)
I am glad you feeling better now,we would be rechecking your chest xray to make sure the infection is all gone. You can go over to [facility] [place] to get your xray done. If you have any question please contact us.

## 2013-12-25 NOTE — Assessment & Plan Note (Signed)
He completed his Z-pak. Feeling much better. Chest xray ordered to assess for resolution as per his PCP. I will f/u with report.

## 2013-12-25 NOTE — Progress Notes (Signed)
Subjective:      Patient ID: ZYKING QUIGGINS, male   DOB: [birth year], 73 y.o.   MRN: [id number]  HPI CAP: Here for follow up,denies any cough,chest pain or SOB,he is s/p Z-Pak.  Current Outpatient Prescriptions on File Prior to Visit  Medication Sig Dispense Refill  . aspirin 325 MG tablet Take 325 mg by mouth every 6 (six) hours as needed for pain.       . sildenafil (VIAGRA) 100 MG tablet Take 1 tablet (100 mg total) by mouth as needed.  5 tablet  3  . traMADol (ULTRAM) 50 MG tablet Take 1 tablet (50 mg total) by mouth every 6 (six) hours as needed for pain.  15 tablet  0  . zoster vaccine live, PF, (ZOSTAVAX) 07680 UNT/0.65ML injection Inject 19,400 Units into the skin once.  0.65 mL  0   No current facility-administered medications on file prior to visit.   Past Medical History  Diagnosis Date  . Leg mass     rt    Review of Systems  Respiratory: Negative.   Cardiovascular: Negative.   Gastrointestinal: Negative.   All other systems reviewed and are negative.   Filed Vitals:   12/25/13 1329  BP: 139/80  Pulse: 75  Temp: 98.6 F (37 C)  TempSrc: Oral  Height: 5\' 4"  (1.626 m)  Weight: 150 lb (68.04 kg)  SpO2: 97%       Objective:   Physical Exam  Nursing note and vitals reviewed. Constitutional: He appears well-developed. No distress.  Cardiovascular: Normal rate, regular rhythm, normal heart sounds and intact distal pulses.   No murmur heard. Pulmonary/Chest: Effort normal and breath sounds normal. No respiratory distress. He has no wheezes. He has no rales.       Assessment/Plan:      Check problem list

## 2013-12-26 ENCOUNTER — Telehealth: Payer: Self-pay | Admitting: Family Medicine

## 2013-12-26 NOTE — Telephone Encounter (Signed)
I called and left a message for patient xray report was normal. NB: Phone number on record not working, I called his emergency contact/son who suggested I dialed this number to reach him [phone],MGNOIBB left test was normal.

## 2014-01-13 ENCOUNTER — Encounter: Payer: Medicare Other | Admitting: Family Medicine

## 2014-01-13 NOTE — Progress Notes (Signed)
This encounter was created in error - please disregard.  This encounter was created in error - please disregard.

## 2014-08-06 ENCOUNTER — Emergency Department (HOSPITAL_COMMUNITY): Payer: Medicare PPO

## 2014-08-06 ENCOUNTER — Observation Stay (HOSPITAL_COMMUNITY)
Admission: EM | Admit: 2014-08-06 | Discharge: 2014-08-07 | Disposition: A | Discharge status: Home / Self Care | Payer: Medicare PPO | Attending: Family Medicine | Admitting: Family Medicine

## 2014-08-06 ENCOUNTER — Encounter (HOSPITAL_COMMUNITY): Payer: Self-pay | Admitting: Emergency Medicine

## 2014-08-06 DIAGNOSIS — R0789 Other chest pain: Secondary | ICD-10-CM

## 2014-08-06 DIAGNOSIS — R079 Chest pain, unspecified: Secondary | ICD-10-CM | POA: Diagnosis not present

## 2014-08-06 DIAGNOSIS — K219 Gastro-esophageal reflux disease without esophagitis: Secondary | ICD-10-CM | POA: Diagnosis present

## 2014-08-06 DIAGNOSIS — Z87891 Personal history of nicotine dependence: Secondary | ICD-10-CM | POA: Diagnosis not present

## 2014-08-06 LAB — BASIC METABOLIC PANEL
Anion gap: 12 (ref 5–15)
BUN: 13 mg/dL (ref 6–23)
CALCIUM: 9.6 mg/dL (ref 8.4–10.5)
CO2: 26 meq/L (ref 19–32)
Chloride: 102 mEq/L (ref 96–112)
Creatinine, Ser: 1.02 mg/dL (ref 0.50–1.35)
GFR calc Af Amer: 83 mL/min — ABNORMAL LOW (ref >90)
GFR, EST NON AFRICAN AMERICAN: 72 mL/min — AB (ref >90)
GLUCOSE: 90 mg/dL (ref 70–99)
Potassium: 3.6 mEq/L — ABNORMAL LOW (ref 3.7–5.3)
Sodium: 140 mEq/L (ref 137–147)

## 2014-08-06 LAB — CBC
HEMATOCRIT: 39.9 % (ref 39.0–52.0)
Hemoglobin: 12.7 g/dL — ABNORMAL LOW (ref 13.0–17.0)
MCH: 28.2 pg (ref 26.0–34.0)
MCHC: 31.8 g/dL (ref 30.0–36.0)
MCV: 88.5 fL (ref 78.0–100.0)
Platelets: 238 10*3/uL (ref 150–400)
RBC: 4.51 MIL/uL (ref 4.22–5.81)
RDW: 12.5 % (ref 11.5–15.5)
WBC: 6.8 10*3/uL (ref 4.0–10.5)

## 2014-08-06 LAB — I-STAT TROPONIN, ED: Troponin i, poc: 0.01 ng/mL (ref 0.00–0.08)

## 2014-08-06 LAB — PRO B NATRIURETIC PEPTIDE: Pro B Natriuretic peptide (BNP): 58.8 pg/mL (ref 0–125)

## 2014-08-06 NOTE — ED Notes (Signed)
Patient c/o left chest pain onset this evening, states he was talking on the phone while sitting in his vehicle and states that he became "sweaty". Patient denies the phone call being stressful. Patient states he has had this pain before but that tonight it was worse.

## 2014-08-07 ENCOUNTER — Observation Stay (HOSPITAL_COMMUNITY): Payer: Medicare PPO

## 2014-08-07 DIAGNOSIS — Z87891 Personal history of nicotine dependence: Secondary | ICD-10-CM | POA: Diagnosis not present

## 2014-08-07 DIAGNOSIS — K219 Gastro-esophageal reflux disease without esophagitis: Secondary | ICD-10-CM | POA: Diagnosis present

## 2014-08-07 DIAGNOSIS — R0789 Other chest pain: Secondary | ICD-10-CM

## 2014-08-07 DIAGNOSIS — R079 Chest pain, unspecified: Secondary | ICD-10-CM | POA: Diagnosis not present

## 2014-08-07 LAB — CREATININE, SERUM
CREATININE: 0.97 mg/dL (ref 0.50–1.35)
GFR calc Af Amer: >90 mL/min (ref >90)
GFR, EST NON AFRICAN AMERICAN: 81 mL/min — AB (ref >90)

## 2014-08-07 LAB — CBC
HEMATOCRIT: 39.6 % (ref 39.0–52.0)
HEMOGLOBIN: 12.5 g/dL — AB (ref 13.0–17.0)
MCH: 28.5 pg (ref 26.0–34.0)
MCHC: 31.6 g/dL (ref 30.0–36.0)
MCV: 90.4 fL (ref 78.0–100.0)
Platelets: 202 10*3/uL (ref 150–400)
RBC: 4.38 MIL/uL (ref 4.22–5.81)
RDW: 12.5 % (ref 11.5–15.5)
WBC: 6 10*3/uL (ref 4.0–10.5)

## 2014-08-07 LAB — LIPID PANEL
Cholesterol: 178 mg/dL (ref 0–200)
HDL: 65 mg/dL (ref >39)
LDL Cholesterol: 104 mg/dL — ABNORMAL HIGH (ref 0–99)
TRIGLYCERIDES: 43 mg/dL (ref <150)
Total CHOL/HDL Ratio: 2.7 RATIO
VLDL: 9 mg/dL (ref 0–40)

## 2014-08-07 LAB — TROPONIN I
Troponin I: <0.3 ng/mL (ref <0.30)
Troponin I: <0.3 ng/mL (ref <0.30)

## 2014-08-07 LAB — HEMOGLOBIN A1C
HEMOGLOBIN A1C: 5.3 % (ref <5.7)
MEAN PLASMA GLUCOSE: 105 mg/dL (ref <117)

## 2014-08-07 LAB — TSH: TSH: 1.59 u[IU]/mL (ref 0.350–4.500)

## 2014-08-07 MED ORDER — SODIUM CHLORIDE 0.45 % IV SOLN
INTRAVENOUS | Status: DC
  Administered 2014-08-07: 04:00:00 via INTRAVENOUS

## 2014-08-07 MED ORDER — ASPIRIN 81 MG PO CHEW
324.0000 mg | CHEWABLE_TABLET | Freq: Once | ORAL | Status: AC
  Administered 2014-08-07: 324 mg via ORAL
  Filled 2014-08-07: qty 4

## 2014-08-07 MED ORDER — METOPROLOL TARTRATE 12.5 MG HALF TABLET: 12.5000 mg | ORAL_TABLET | Freq: Two times a day (BID) | ORAL | Status: DC

## 2014-08-07 MED ORDER — ENOXAPARIN SODIUM 40 MG/0.4ML ~~LOC~~ SOLN
40.0000 mg | SUBCUTANEOUS | Status: DC
  Administered 2014-08-07: 40 mg via SUBCUTANEOUS
  Filled 2014-08-07 (×2): qty 0.4

## 2014-08-07 MED ORDER — TECHNETIUM TC 99M SESTAMIBI GENERIC - CARDIOLITE
10.0000 | Freq: Once | INTRAVENOUS | Status: AC | PRN
  Administered 2014-08-07: 10 via INTRAVENOUS

## 2014-08-07 MED ORDER — TECHNETIUM TC 99M SESTAMIBI GENERIC - CARDIOLITE
30.0000 | Freq: Once | INTRAVENOUS | Status: AC | PRN
  Administered 2014-08-07: 30 via INTRAVENOUS

## 2014-08-07 MED ORDER — ASPIRIN 81 MG PO CHEW
81.0000 mg | CHEWABLE_TABLET | Freq: Every day | ORAL | Status: DC
  Filled 2014-08-07: qty 1

## 2014-08-07 MED ORDER — GI COCKTAIL ~~LOC~~: 30.0000 mL | Freq: Four times a day (QID) | ORAL | Status: DC | PRN

## 2014-08-07 MED ORDER — MORPHINE SULFATE 2 MG/ML IJ SOLN: 2.0000 mg | INTRAMUSCULAR | Status: DC | PRN

## 2014-08-07 MED ORDER — ONDANSETRON HCL 4 MG/2ML IJ SOLN: 4.0000 mg | Freq: Four times a day (QID) | INTRAMUSCULAR | Status: DC | PRN

## 2014-08-07 MED ORDER — PRAVASTATIN SODIUM 40 MG PO TABS: 40.0000 mg | ORAL_TABLET | Freq: Every day | ORAL | Status: DC

## 2014-08-07 MED ORDER — ASPIRIN 81 MG PO CHEW: 81.0000 mg | CHEWABLE_TABLET | Freq: Every day | ORAL | Status: DC

## 2014-08-07 MED ORDER — ACETAMINOPHEN 325 MG PO TABS: 650.0000 mg | ORAL_TABLET | ORAL | Status: DC | PRN

## 2014-08-07 NOTE — Discharge Summary (Signed)
Family Medicine Teaching [facility] Discharge Summary  [name] Medical record number: [id number] Date of birth: 01-22-1941 Age: 73 y.o. Gender: male Date of Admission: 08/06/2014  Date of Discharge: 08/07/2014 Admitting Physician: Leighton Roach McDiarmid, MD  Primary Care Provider: Carney Living, MD Consultants: Cardiology  Indication for Hospitalization: Chest pain  Discharge Diagnoses/Problem List:  Patient Active Problem List   Diagnosis Date Noted  . Chest pain 08/07/2014  . CAP (community acquired pneumonia) 12/25/2013  . Fever, unspecified 10/22/2013  . Pain in left shoulder 09/18/2013  . Mass of lower leg 06/28/2011  . Quit smoking 11/20/2008  . BRADYCARDIA 10/11/2007   Disposition: Discharge Home  Discharge Condition: Stable  Discharge Exam:  General: well appearing, resting in bed with daughter at bedside  HEENT: NCAT, EOMI, PERRL  Cardiovascular: bradycardic with RR, no murmurs rubs or gallops  Respiratory: CTAB, normal WOB  Abdomen: SNTND, no hepatosplenomegaly  Extremities: WWP, no peripheral edema  Skin: no signs of breakdown  Neuro: awake and alert  Brief Hospital Course:  Dale [name] is a 73 y.o. male presenting with chest discomfort . PMH is significant for smoking and bradycardia. HEART score at time of admission was 5 so ACS r/o was initiated. Troponins were cycled and negative. 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 Asymptomatic. No indication for pacer at this time. Cardiology was consulted. Since patient has had a couple episodes of chest discomfort an exercise stress Myoview study was done. It was normal. Rule out of ACS was completed. Patient sent home on ASA and statin.   Issues for Follow Up:  1. Needs consistent PCP follow-up 2. Access how patient doing on new medication; any side effects  Significant Procedures: Exercise Myoview  Significant Labs and Imaging:   Recent Labs Lab  08/06/14 2203 08/07/14 0534  WBC 6.8 6.0  HGB 12.7* 12.5*  HCT 39.9 39.6  PLT 238 202    Recent Labs Lab 08/06/14 2203 08/07/14 0534  NA 140  --   K 3.6*  --   CL 102  --   CO2 26  --   GLUCOSE 90  --   BUN 13  --   CREATININE 1.02 0.97  CALCIUM 9.6  --    Trops negative CXR without acute abnormalities  Results/Tests Pending at Time of Discharge: None  Discharge Medications:    Medication List         aspirin 81 MG chewable tablet  Chew 1 tablet (81 mg total) by mouth daily.     pravastatin 40 MG tablet  Commonly known as:  PRAVACHOL  Take 1 tablet (40 mg total) by mouth daily.        Discharge Instructions: Please refer to Patient Instructions section of EMR for full details.  Patient was counseled important signs and symptoms that should prompt return to medical care, changes in medications, dietary instructions, activity restrictions, and follow up appointments.   Follow-Up Appointments: -Patient to call and schedule follow-up appointment with PCP  Pincus Large, DO 08/10/2014, 1:41 PM PGY-1, [facility]

## 2014-08-07 NOTE — H&P (Signed)
FMTS Attending Admit Note Patient seen and examined by me, discussed with resident team and I agree with Dr Nyra Capes assessment and plan. Patient presents to ED for onset of sharp chest pain in the evening of 08/26 (@1930pm ), while backing his truck up.  Had been working cutting grass much earlier.  Associated diaphoresis.  Had similar pain about a month ago, was less severe than on 08/26.  At time of my interview he reports being back to baseline, no chest pain and no dyspnea/diaphoresis.   Quit smoking 2-3 years ago. Quit moonshine.  Troponin from this morning 0530am is unremarkable.  ECG with first degree block, no findings of acute ischemia/infarct.   On exam he is well appearing, joking with me and son, no apparent distress HEENT Neck supple, no JVD. No cervical adenopathy.  COR Regular S1S2. No extra sounds. No tenderness to palpate along sternum or anterior shoulders.  PULM Clear bilat.  ABD Soft, nontender. No masses.  EXTS no edema, no calf tenderness, no cords.  A/P: Patient admitted with chest pain; negative Troponin I x2, ECG that does not support diagnosis of ACS.  Given that patient is admitted under chest pain protocol, for Cardiology evaluation to determine if patient is candidate for further cardiac testing as in patient. Paula Compton, MD

## 2014-08-07 NOTE — Progress Notes (Addendum)
**  Interim Note** Went to go see patient this morning. He no longer complains of any chest pain or dullness. He has RRR and EKG shows sinus bradycardia with 1st degree AV block. While I was in the room cardiology came by(please see his note for assessment and plan). We both agree that based off labs and history it does not seem like the chest pain was cardiac in origin. We will continue to cycle troponins and await results of stress test. Will remain NPO. Possible discharge this afternoon.

## 2014-08-07 NOTE — ED Notes (Signed)
Notified [facility] of transport.

## 2014-08-07 NOTE — H&P (Signed)
Family Medicine Teaching [facility] Admission History and Physical Service Pager: [phone]  [name] Medical record number: [id number] Date of birth: February 13, 1941 Age: 73 y.o. Gender: male  Primary Care Provider: Carney Living, MD Consultants: cards  Code Status: full  Chief Complaint:  Chest pain  Assessment and Plan: Dale [name] is a 73 y.o. male presenting with chest discomfort . PMH is significant for smoking, bradycardia  #Chest pain- HEART score of 5 ddx includes ACS (not limited to angina, MI with family hx, personal hx of smoking and suspicious story although w/up to date has been neg) vs GER (does have hx but not currently on meds) vs Costocondritis (occurred when turning around in his car backwards, although CP not reproducible) vs pleuritis (no friction rub or concerning pattern on EKG) -admit to tele under Dr. Mauricio Po -cycle troponins -repeat morning EKG -morphine prn for chest pain -baby ASA to start tmw s/p 325 in ED at [facility] -trial of GI cocktail prn -zofran as needed -risk stratification with A1C, TSH, Lipid panel -hold on BB for now -O2 as needed  #Bradycardia with prolonged PR- 1st degree AV block per EKG; has noted brady in the past in his chart; largely asymptomatic -will hold on BB for now  -repeat EKG later today -no indication for pacer at this time  #Tobacco abuse- quit about 2 years ago. 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 Mauricio Po   History of Present Illness: Dale [name] is a 73 y.o. male presenting with chest discomfort earlier today while sitting in his car after reversing his truck. Sharp substernal pain left  sided. Pain without radiation but assc with SOB and diaphoresis. Has had a hx of similar discomfort in the past but never with this intensity. Lasted approx  Hr. Does have a father that had an MI (in 62s), sister with CABG?. Pt is a previous smoker but has quit approx 2 yrs ago.   In the ED pt with HEART score of 5, given ASA 325. Hemodynamically stable. CXRwithout acute abnormalities. I trop neg, BNP 58. EKG with prolonged PR but otherwise nml. Pt with improvement of pain but still with dull ache.   Review Of Systems: Per HPI with the following additions: none Otherwise 12 point review of systems was performed and was unremarkable.  Patient Active Problem List   Diagnosis Date Noted  . Chest pain 08/07/2014  . CAP (community acquired pneumonia) 12/25/2013  . Fever, unspecified 10/22/2013  . Pain in left shoulder 09/18/2013  . Mass of lower leg 06/28/2011  . Quit smoking 11/20/2008  . BRADYCARDIA 10/11/2007   Past Medical History: Past Medical History  Diagnosis Date  . Leg mass     rt   Past Surgical History: History reviewed. No pertinent past surgical history. Social History: History  Substance Use Topics  . Smoking status: Former Smoker -- 1.00 packs/day for 1 years    Types: Cigarettes    Quit date: 10/23/2011  . Smokeless tobacco: Not on file  . Alcohol Use: Yes     Comment: occ   Additional social history: none Please also refer to relevant sections of EMR.  Family History: History reviewed. No pertinent family history. Allergies and Medications: No Known Allergies No current facility-administered medications on file prior to encounter.   No current outpatient prescriptions on file prior to encounter.    Objective: BP 137/72  Pulse 51  Temp(Src) 97.7 F (36.5 C) (Oral)  Resp 18  Ht 5\' 5"  (1.651 m)  Wt 142 lb 4.8 oz (64.547 kg)  BMI 23.68 kg/m2  SpO2 98% Exam: General: well appearing, resting in bed with daughter at bedside HEENT: NCAT, EOMI,  PERRL Cardiovascular: bradycardic with RR, no murmurs rubs or gallops  Respiratory: CTAB, normal WOB Abdomen: SNTND, no hepatosplenomegaly Extremities: WWP, no peripheral edema Skin: no signs of breakdown Neuro: awake and alert  Labs and Imaging: CBC BMET   Recent Labs Lab 08/06/14 2203  WBC 6.8  HGB 12.7*  HCT 39.9  PLT 238    Recent Labs Lab 08/06/14 2203  NA 140  K 3.6*  CL 102  CO2 26  BUN 13  CREATININE 1.02  GLUCOSE 90  CALCIUM 9.6     itrop neg CXR without acute  Dale Ferretti, MD 08/07/2014, 2:54 AM PGY-2, [facility] FPTS Intern pager: [phone], text pages welcome

## 2014-08-07 NOTE — Progress Notes (Signed)
     The patient was seen in nuclear medicine for a exercise myoview. He  tolerated the procedure well. No acute ST or TW changes on ECG. Mild chest pressure. He walked 10 minutes and reached target HR of 126bpm.   Thereasa Parkin PA-C  [facility]

## 2014-08-07 NOTE — ED Notes (Signed)
Pt Belongings:  Clothing, shoes, wallet and cell phone with family.  Pt has hat, glasses and underwear in his possession.

## 2014-08-07 NOTE — ED Provider Notes (Signed)
CSN: [id number]     Arrival date & time 08/06/14  2117 History   First MD Initiated Contact with Patient 08/06/14 2344     Chief Complaint  Patient presents with  . Chest Pain     (Consider location/radiation/quality/duration/timing/severity/associated sxs/prior Treatment) HPI 73 year old male presents to emergency room from home with complaint of chest pain.  Patient reports he sitting in his truck, listening to the radio when he had onset of severe strong pain in his left chest.  No radiation.  It was associated with shortness of breath and diaphoresis.  He denies nausea.  Patient reports he has had milder pain in the past, but this is most severe.  Pain lasted about an hour before resolving.  He reports that he has soreness now but rest of symptoms have resolved.  He reports his father had heart attack at unknown age.  Patient has history of smoking, has been quit for 2 years. Past Medical History  Diagnosis Date  . Leg mass     rt   History reviewed. No pertinent past surgical history. History reviewed. No pertinent family history. History  Substance Use Topics  . Smoking status: Former Smoker -- 1.00 packs/day for 1 years    Types: Cigarettes    Quit date: 10/23/2011  . Smokeless tobacco: Not on file  . Alcohol Use: Yes     Comment: occ    Review of Systems   See History of Present Illness; otherwise all other systems are reviewed and negative  Allergies  Review of patient's allergies indicates no known allergies.  Home Medications   Prior to Admission medications   Not on File   BP 136/57  Pulse 62  Temp(Src) 97.7 F (36.5 C) (Oral)  Resp 16  SpO2 98% Physical Exam  Nursing note and vitals reviewed. Constitutional: He is oriented to person, place, and time. He appears well-developed and well-nourished.  HENT:  Head: Normocephalic and atraumatic.  Nose: Nose normal.  Mouth/Throat: Oropharynx is clear and moist.  Eyes: Conjunctivae and EOM are normal. Pupils  are equal, round, and reactive to light.  Neck: Normal range of motion. Neck supple. No JVD present. No tracheal deviation present. No thyromegaly present.  Cardiovascular: Normal rate, regular rhythm, normal heart sounds and intact distal pulses.  Exam reveals no gallop and no friction rub.   No murmur heard. Pulmonary/Chest: Effort normal and breath sounds normal. No stridor. No respiratory distress. He has no wheezes. He has no rales. He exhibits no tenderness.  Abdominal: Soft. Bowel sounds are normal. He exhibits no distension and no mass. There is no tenderness. There is no rebound and no guarding.  Musculoskeletal: Normal range of motion. He exhibits no edema and no tenderness.  Lymphadenopathy:    He has no cervical adenopathy.  Neurological: He is alert and oriented to person, place, and time. He exhibits normal muscle tone. Coordination normal.  Skin: Skin is warm and dry. No rash noted. No erythema. No pallor.  Psychiatric: He has a normal mood and affect. His behavior is normal. Judgment and thought content normal.    ED Course  Procedures (including critical care time) Labs Review Labs Reviewed  CBC - Abnormal; Notable for the following:    Hemoglobin 12.7 (*)    All other components within normal limits  BASIC METABOLIC PANEL - Abnormal; Notable for the following:    Potassium 3.6 (*)    GFR calc non Af Amer 72 (*)    GFR calc Af Amer 83 (*)  All other components within normal limits  PRO B NATRIURETIC PEPTIDE  I-STAT TROPOININ, ED    Imaging Review Dg Chest Port 1 View  08/06/2014   CLINICAL DATA:  Chest pain and shortness of breath.  Ex-smoker.  EXAM: PORTABLE CHEST - 1 VIEW  COMPARISON:  12/25/2013.  FINDINGS: Normal sized heart. Clear lungs. Mild thoracic spine degenerative changes.  IMPRESSION: No acute abnormality.   Electronically Signed   By: Gordan Payment M.D.   On: 08/06/2014 22:36     EKG Interpretation   Date/Time:  Wednesday August 06 2014 21:27:25  EDT Ventricular Rate:  58 PR Interval:  258 QRS Duration: 85 QT Interval:  404 QTC Calculation: 397 R Axis:   49 Text Interpretation:  Sinus rhythm Prolonged PR interval Confirmed by  WARD,  DO, KRISTEN ([id number]) on 08/06/2014 11:49:09 PM      MDM   Final diagnoses:  Other chest pain    73 year old male with chest pain.  He has minimal risk factors for coronary disease, but has a very concerning story given the nature of his pain.  Will discuss with the practice for a Mission for chest pain observation.  HEART Score 5   Olivia Mackie, MD 08/07/14 [phone]

## 2014-08-07 NOTE — Consult Note (Signed)
CONSULT NOTE  Date: 08/07/2014               Patient Name:  Dale [name] MRN: [id number]  DOB: [birth year] Age / Sex: 73 y.o., male        PCP: [facility] Primary Cardiologist: New / Nahser            Referring Physician: McDairmid              Reason for Consult: Chest pain            History of Present Illness: Patient is a 73 y.o. male with a PMHx of cigarette smoking, who was admitted to [facility] on 08/06/2014 for evaluation of chest discomfort.  The patient presented to the emergency room early this morning with a history of chest discomfort. It started when he was backing up his car.  The pain was very sharp chest pain. It would last for several seconds. He did have some diaphoresis but no nausea vomiting.  Also relates some exertional chest discomfort from several weeks ago. He was out weed eating and had a vague discomfort in his chest.   Medications: Outpatient medications: No prescriptions prior to admission    Current medications: Current Facility-Administered Medications  Medication Dose Route Frequency Provider Last Rate Last Dose  . 0.45 % sodium chloride infusion   Intravenous Continuous Charlane Ferretti, MD 75 mL/hr at 08/07/14 0413    . acetaminophen (TYLENOL) tablet 650 mg  650 mg Oral Q4H PRN Charlane Ferretti, MD      . Melene Muller ON 08/08/2014] aspirin chewable tablet 81 mg  81 mg Oral Daily Charlane Ferretti, MD      . enoxaparin (LOVENOX) injection 40 mg  40 mg Subcutaneous Q24H Charlane Ferretti, MD   40 mg at 08/07/14 0407  . gi cocktail (Maalox,Lidocaine,Donnatal)  30 mL Oral QID PRN Charlane Ferretti, MD      . morphine 2 MG/ML injection 2 mg  2 mg Intravenous Q2H PRN Charlane Ferretti, MD      . ondansetron (ZOFRAN) injection 4 mg  4 mg Intravenous Q6H PRN Charlane Ferretti, MD         No Known Allergies   Past Medical History  Diagnosis Date  . Leg mass     rt    History reviewed. No pertinent past surgical history.  History reviewed. No  pertinent family history.  Social History:  reports that he quit smoking about 2 years ago. His smoking use included Cigarettes. He has a 1 pack-year smoking history. He does not have any smokeless tobacco history on file. He reports that he drinks alcohol. He reports that he does not use illicit drugs.   Review of Systems: Constitutional:  denies fever, chills, diaphoresis, appetite change and fatigue.  HEENT: denies photophobia, eye pain, redness, hearing loss, ear pain, congestion, sore throat, rhinorrhea, sneezing, neck pain, neck stiffness and tinnitus.  Respiratory: denies SOB, DOE, cough, chest tightness, and wheezing.  Cardiovascular: admits to chest pain,  Gastrointestinal: denies nausea, vomiting, abdominal pain, diarrhea, constipation, blood in stool.  Genitourinary: denies dysuria, urgency, frequency, hematuria, flank pain and difficulty urinating.  Musculoskeletal: denies  myalgias, back pain, joint swelling, arthralgias and gait problem.   Skin: denies pallor, rash and wound.  Neurological: denies dizziness, seizures, syncope, weakness, light-headedness, numbness and headaches.   Hematological: denies adenopathy, easy bruising, personal or family bleeding history.  Psychiatric/ Behavioral: denies suicidal ideation, mood changes, confusion,  nervousness, sleep disturbance and agitation.    Physical Exam: BP 116/69  Pulse 57  Temp(Src) 98.2 F (36.8 C) (Oral)  Resp 18  Ht 5\' 5"  (1.651 m)  Wt 142 lb 4.8 oz (64.547 kg)  BMI 23.68 kg/m2  SpO2 100%  Wt Readings from Last 3 Encounters:  08/07/14 142 lb 4.8 oz (64.547 kg)  12/25/13 150 lb (68.04 kg)  10/22/13 145 lb 12.8 oz (66.134 kg)    General: Vital signs reviewed and noted. Well-developed, well-nourished, in no acute distress; alert,   Head: Normocephalic, atraumatic, sclera anicteric,   Neck: Supple. Negative for carotid bruits. No JVD   Lungs:  Clear bilaterally, no  wheezes, rales, or rhonchi. Breathing is normal    Heart: RRR with S1 S2. No murmurs, rubs, or gallops   Abdomen:  Soft, non-tender, non-distended with normoactive bowel sounds. No hepatomegaly. No rebound/guarding. No obvious abdominal masses   MSK: Strength and the appear normal for age.   Extremities: No clubbing or cyanosis. No edema.  Distal pedal pulses are 2+ and equal   Neurologic: Alert and oriented X 3. Moves all extremities spontaneously.  Psych: Responds to questions appropriately with a normal affect.     Lab results: Basic Metabolic Panel:  Recent Labs Lab 08/06/14 2203 08/07/14 0534  NA 140  --   K 3.6*  --   CL 102  --   CO2 26  --   GLUCOSE 90  --   BUN 13  --   CREATININE 1.02 0.97  CALCIUM 9.6  --     Liver Function Tests: No results found for this basename: AST, ALT, ALKPHOS, BILITOT, PROT, ALBUMIN,  in the last 168 hours No results found for this basename: LIPASE, AMYLASE,  in the last 168 hours No results found for this basename: AMMONIA,  in the last 168 hours  CBC:  Recent Labs Lab 08/06/14 2203 08/07/14 0534  WBC 6.8 6.0  HGB 12.7* 12.5*  HCT 39.9 39.6  MCV 88.5 90.4  PLT 238 202    Cardiac Enzymes:  Recent Labs Lab 08/07/14 0534  TROPONINI <0.30    BNP: No components found with this basename: POCBNP,   CBG: No results found for this basename: GLUCAP,  in the last 168 hours  Coagulation Studies: No results found for this basename: LABPROT, INR,  in the last 72 hours   Other results: EKG NSR,  No acute changed  Imaging: Dg Chest Port 1 View  08/06/2014   CLINICAL DATA:  Chest pain and shortness of breath.  Ex-smoker.  EXAM: PORTABLE CHEST - 1 VIEW  COMPARISON:  12/25/2013.  FINDINGS: Normal sized heart. Clear lungs. Mild thoracic spine degenerative changes.  IMPRESSION: No acute abnormality.   Electronically Signed   By: Gordan Payment M.D.   On: 08/06/2014 22:36        Assessment & Plan:  1. Chest discomfort: The patient presents with several types of chest discomfort.    A little difficult to get an accurate history from him. A more concerned about the exertional chest pain that occurred several weeks ago then the sharp chest pain that occurred yesterday. I think that we should proceed with a stress Myoview study. If the Myoview study is normal then he will be able to be discharged later today. It's abnormal, will set him up for cardiac cath  tomorrow.     Vesta Mixer, Montez Hageman., MD, [facility] 08/07/2014, 8:10 AM Office - [phone] Pager [phone]

## 2014-08-07 NOTE — Progress Notes (Signed)
Discharge instructions reviewed with patient to include new medications and follow up appointments.  Patient voices understanding to teaching.  Patient ambulatory to door.  Home via POV with his daughter driving.

## 2014-08-07 NOTE — Discharge Instructions (Addendum)
Discharge Date: 08/07/2014  Reason for Hospitalization: Chest Pain -It does not appear that your chest pain was due to a cardiac source at this time however it can not be completely ruled out -Please return to the ER or call your doctor if you have anymore chest pain.  New medications:  -We are starting you on a medication called Pravachol and Aspirin. This medication will help decrease coronary artery disease.  **Please take as prescribed on the medication bottle**   Thank you for letting us participate in your care!

## 2014-08-07 NOTE — ED Notes (Signed)
[facility] here for transport.  

## 2014-08-07 NOTE — Progress Notes (Signed)
Patient EKG showed some changes from EKG obtained yesterday, indicating possible lateral infarct, age undetermined with 1st degree A-V block. Dr.Marsh made aware, will consult with attending, no new orders given at this time. Will continue to monitor patient.

## 2014-08-11 NOTE — Discharge Summary (Signed)
I discussed with  Dr Phelps.  I agree with their plans documented in their discharge note.  

## 2014-08-15 ENCOUNTER — Ambulatory Visit (INDEPENDENT_AMBULATORY_CARE_PROVIDER_SITE_OTHER): Payer: Commercial Managed Care - HMO | Admitting: Family Medicine

## 2014-08-15 ENCOUNTER — Encounter: Payer: Self-pay | Admitting: Family Medicine

## 2014-08-15 VITALS — BP 144/85 | HR 62

## 2014-08-15 DIAGNOSIS — R079 Chest pain, unspecified: Secondary | ICD-10-CM

## 2014-08-15 DIAGNOSIS — Z23 Encounter for immunization: Secondary | ICD-10-CM

## 2014-08-15 MED ORDER — PANTOPRAZOLE SODIUM 40 MG PO TBEC: 40.0000 mg | DELAYED_RELEASE_TABLET | Freq: Every day | ORAL | Status: DC

## 2014-08-15 NOTE — Assessment & Plan Note (Signed)
S/P hospital discharge. Stress test done and was normal. Symptoms seem more like GERD today. Continue ASA and Statin. I started him on Protonix 40 mg qd. Avoid food triggers. F/U with PCP in 4 wks for reassessment.

## 2014-08-15 NOTE — Patient Instructions (Signed)
Gastroesophageal Reflux Disease, Adult °Gastroesophageal reflux disease (GERD) happens when acid from your stomach goes into your food pipe (esophagus). The acid can cause a burning feeling in your chest. Over time, the acid can make small holes (ulcers) in your food pipe.  °HOME CARE °· Ask your doctor for advice about: °¨ Losing weight. °¨ Quitting smoking. °¨ Alcohol use. °· Avoid foods and drinks that make your problems worse. You may want to avoid: °¨ Caffeine and alcohol. °¨ Chocolate. °¨ Mints. °¨ Garlic and onions. °¨ Spicy foods. °¨ Citrus fruits, such as oranges, lemons, or limes. °¨ Foods that contain tomato, such as sauce, chili, salsa, and pizza. °¨ Fried and fatty foods. °· Avoid lying down for 3 hours before you go to bed or before you take a nap. °· Eat small meals often, instead of large meals. °· Wear loose-fitting clothing. Do not wear anything tight around your waist. °· Raise (elevate) the head of your bed 6 to 8 inches with wood blocks. Using extra pillows does not help. °· Only take medicines as told by your doctor. °· Do not take aspirin or ibuprofen. °GET HELP RIGHT AWAY IF:  °· You have pain in your arms, neck, jaw, teeth, or back. °· Your pain gets worse or changes. °· You feel sick to your stomach (nauseous), throw up (vomit), or sweat (diaphoresis). °· You feel short of breath, or you pass out (faint). °· Your throw up is green, yellow, black, or looks like coffee grounds or blood. °· Your poop (stool) is red, bloody, or black. °MAKE SURE YOU:  °· Understand these instructions. °· Will watch your condition. °· Will get help right away if you are not doing well or get worse. °Document Released: 05/16/2008 Document Revised: 02/20/2012 Document Reviewed: 06/17/2011 °[employer]® Patient Information ©2015 [employer]. This information is not intended to replace advice given to you by your health care provider. Make sure you discuss any questions you have with your health care provider. ° °

## 2014-08-15 NOTE — Progress Notes (Signed)
Subjective:     Patient ID: Dale [name], male   DOB: 08-Mar-1941, 73 y.o.   MRN: [id number]  HPI Chest pain: Here for hospital follow up for chest pain, he had all work up done which was negative, he has had few episodes of chest pain after hospital discharge mostly around his epigastric area, associated at times with food and improves with burping, he denies N/V, no change in bowel habit, has issue with reflux at times.  Current Outpatient Prescriptions on File Prior to Visit  Medication Sig Dispense Refill  . aspirin 81 MG chewable tablet Chew 1 tablet (81 mg total) by mouth daily.  30 tablet  3  . pravastatin (PRAVACHOL) 40 MG tablet Take 1 tablet (40 mg total) by mouth daily.  30 tablet  3   No current facility-administered medications on file prior to visit.   Past Medical History  Diagnosis Date  . Leg mass     rt      Review of Systems  Respiratory: Negative.   Cardiovascular: Negative.   Gastrointestinal: Negative.   Genitourinary: Negative.   All other systems reviewed and are negative.  Filed Vitals:   08/15/14 1346  BP: 144/85  Pulse: 62  SpO2: 97%       Objective:   Physical Exam  Nursing note and vitals reviewed. Constitutional: He appears well-developed. No distress.  Cardiovascular: Normal rate, regular rhythm, normal heart sounds and intact distal pulses.   No murmur heard. Pulmonary/Chest: Effort normal and breath sounds normal. No respiratory distress. He has no wheezes. He exhibits no tenderness.  Abdominal: Soft. Bowel sounds are normal. He exhibits no distension and no mass. There is no tenderness.  Musculoskeletal: Normal range of motion. He exhibits no edema.       Assessment:     Chest pain: Likely reflux     Plan:     Check problem list.

## 2014-09-29 ENCOUNTER — Ambulatory Visit: Payer: Commercial Managed Care - HMO | Admitting: Family Medicine

## 2014-10-06 ENCOUNTER — Ambulatory Visit: Payer: Commercial Managed Care - HMO | Admitting: Family Medicine

## 2014-10-15 ENCOUNTER — Encounter: Payer: Self-pay | Admitting: Family Medicine

## 2014-10-15 ENCOUNTER — Ambulatory Visit (INDEPENDENT_AMBULATORY_CARE_PROVIDER_SITE_OTHER): Payer: Commercial Managed Care - HMO | Admitting: Family Medicine

## 2014-10-15 VITALS — BP 129/80 | HR 55 | Temp 97.8°F | Ht 65.0 in | Wt 148.0 lb

## 2014-10-15 DIAGNOSIS — R079 Chest pain, unspecified: Secondary | ICD-10-CM

## 2014-10-15 DIAGNOSIS — R350 Frequency of micturition: Secondary | ICD-10-CM

## 2014-10-15 DIAGNOSIS — N400 Enlarged prostate without lower urinary tract symptoms: Secondary | ICD-10-CM | POA: Insufficient documentation

## 2014-10-15 NOTE — Patient Instructions (Signed)
Good to see you today!  Thanks for coming in.  If the chest pain gets worse or prevents you from doing things then come back  Come back in 6 months - bring all your medications

## 2014-10-15 NOTE — Assessment & Plan Note (Signed)
Persistent most consistent with musculoskeletal  Pain.  No signs of coronary artery disease on recent hospital work up no red flags for abdomen/gastric  Cancer.  No masses seen on recent chest xray.  Will follow

## 2014-10-15 NOTE — Progress Notes (Signed)
   Subjective:    Patient ID: Dale [name], male    DOB: 1941/04/15, 73 y.o.   MRN: [id number]  HPI  Chest pain Still has intermittent left sided very focal can point with one finger chest pain.  Cant reliably relate to any exertion or movements.  Occasional heart burn type sensations.  No shortness of breath or leg edema  Urinary frequency - a by the way complaint at end of visit.  Wakes up several times a night.    No pain or hematuria noticed.  Has been going on for months Can't pee now.  Did not bring his medications  Chief Complaint noted Review of Symptoms - see HPI PMH - Smoking status noted.   Vital Signs reviewed   Review of Systems     Objective:   Physical Exam Alert no acute distress Heart - slow rate and rhythm.  No murmurs, gallops or rubs.    Lungs:  Normal respiratory effort, chest expands symmetrically. Lungs are clear to auscultation, no crackles or wheezes. Extremities:  No cyanosis, edema, or deformity noted with good range of motion of all major joints.   Abdomen: soft and non-tender without masses, organomegaly or hernias noted.  No guarding or rebound     Assessment & Plan:

## 2014-10-15 NOTE — Assessment & Plan Note (Signed)
Likely bph.  Cant urinate today so will come back for exam and UA.  Chck PSA

## 2014-10-16 LAB — PSA: PSA: 0.88 ng/mL (ref <=4.00)

## 2014-10-29 ENCOUNTER — Encounter: Payer: Self-pay | Admitting: Family Medicine

## 2014-10-29 ENCOUNTER — Ambulatory Visit (INDEPENDENT_AMBULATORY_CARE_PROVIDER_SITE_OTHER): Payer: Commercial Managed Care - HMO | Admitting: Family Medicine

## 2014-10-29 VITALS — BP 153/82 | HR 62 | Temp 97.9°F | Ht 65.0 in | Wt 146.0 lb

## 2014-10-29 DIAGNOSIS — R350 Frequency of micturition: Secondary | ICD-10-CM

## 2014-10-29 DIAGNOSIS — N400 Enlarged prostate without lower urinary tract symptoms: Secondary | ICD-10-CM

## 2014-10-29 LAB — POCT URINALYSIS DIPSTICK
Bilirubin, UA: NEGATIVE
Blood, UA: NEGATIVE
GLUCOSE UA: NEGATIVE
KETONES UA: NEGATIVE
Leukocytes, UA: NEGATIVE
NITRITE UA: NEGATIVE
Protein, UA: NEGATIVE
Spec Grav, UA: 1.015
Urobilinogen, UA: 1
pH, UA: 7

## 2014-10-29 MED ORDER — TAMSULOSIN HCL 0.4 MG PO CAPS: 0.4000 mg | ORAL_CAPSULE | Freq: Every day | ORAL | Status: DC

## 2014-10-29 NOTE — Assessment & Plan Note (Signed)
Symptoms consistent with this.  No signs of cancer or infection.  He would like a trial of flomax

## 2014-10-29 NOTE — Patient Instructions (Signed)
Good to see you today!  Thanks for coming in.   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   If you have lightheadness or dizziness with standing then call me  If you urination is not better after 2 weeks then call me  Come back in 6 months

## 2014-10-29 NOTE — Progress Notes (Signed)
   Subjective:    Patient ID: OLUWATISE SPRINGMEYER, male    DOB: 09/08/1941, 73 y.o.   MRN: [id number]  HPI  Urinary frequency Gets up 2-4 x per night, has noticed decrease fos and longer emptying.  No pain or dysuria or particular urgency or hematuria or back pain.  Chest Pain Very occasional when moving and has reflux which belching relieves.  No exertional chest pain symptoms  Chief Complaint noted Review of Symptoms - see HPI PMH - Smoking status noted.   Vital Signs reviewed    Review of Systems     Objective:   Physical Exam  UA noted Rectal - normal size firm prostate wthout nodules, normal tone       Assessment & Plan:

## 2014-12-11 ENCOUNTER — Other Ambulatory Visit: Payer: Self-pay | Admitting: Obstetrics and Gynecology

## 2015-02-04 ENCOUNTER — Ambulatory Visit (INDEPENDENT_AMBULATORY_CARE_PROVIDER_SITE_OTHER): Payer: Commercial Managed Care - HMO | Admitting: Family Medicine

## 2015-02-04 ENCOUNTER — Encounter: Payer: Self-pay | Admitting: Family Medicine

## 2015-02-04 VITALS — BP 121/74 | HR 50 | Temp 97.9°F | Wt 148.1 lb

## 2015-02-04 DIAGNOSIS — K219 Gastro-esophageal reflux disease without esophagitis: Secondary | ICD-10-CM

## 2015-02-04 DIAGNOSIS — Z23 Encounter for immunization: Secondary | ICD-10-CM

## 2015-02-04 DIAGNOSIS — E785 Hyperlipidemia, unspecified: Secondary | ICD-10-CM | POA: Insufficient documentation

## 2015-02-04 DIAGNOSIS — N400 Enlarged prostate without lower urinary tract symptoms: Secondary | ICD-10-CM

## 2015-02-04 NOTE — Assessment & Plan Note (Signed)
Currently controlled with diet.  Can take as needed antiacids.  No red flags of chest pain with exertion or shortness of breath

## 2015-02-04 NOTE — Assessment & Plan Note (Signed)
Pravastatin started in hospital for rule out coronary artery disease.  Normal myoview.  May not need Discuss next visit  Lab Results  Component Value Date   LDLCALC 104* 08/07/2014

## 2015-02-04 NOTE — Patient Instructions (Signed)
Good to see you today!  Thanks for coming in.  Stop the prilosec - take rolaids or tums as needed for heart burn.  If it bothers you a lot or if you have trouble swallowing come back  Keep taking the pravastatin for cholesterol and the tamsuosin for urination  Call you insurance about the shingle shot  Come  Back in 1 year

## 2015-02-04 NOTE — Assessment & Plan Note (Signed)
Good control  Lab Results  Component Value Date   PSA 0.88 10/15/2014   PSA 0.38 11/20/2008   PSA 0.69 10/11/2007

## 2015-02-04 NOTE — Progress Notes (Signed)
   Subjective:    Patient ID: MAVRYCK COGBURN, male    DOB: 1941-07-15, 74 y.o.   MRN: [id number]  HPI HYPERLIPIDEMIA Was started in hospital when admitted for rule out chest pain  Symptoms Chest pain on exertion:  no   Leg claudication:   no Medications: Compliance- daily prava Right upper quadrant pain- no  Muscle aches- no     Component Value Date/Time   CHOL 178 08/07/2014 0534   TRIG 43 08/07/2014 0534   HDL 65 08/07/2014 0534   VLDL 9 08/07/2014 0534   CHOLHDL 2.7 08/07/2014 0534   GERD Disease Monitoring Typical Symptoms:  Burning after hamburgers but none if does not eat certain foods    Bleeding (hematemesis/melena):  no  Food sticking:  no  Weight Loss:  no   Medication Monitoring Compliance:  Not taking regularly   Recent Pneumonia  no    Have attempted taking only PRN currently  BPH Symptoms Well controlled with flomax.  Hesitancy and frequency.  No lightheadness or dizziness.  Symptoms recur if he misses dose  Chief Complaint noted Review of Symptoms - see HPI PMH - Smoking status noted.   Vital Signs reviewed   Review of Systems     Objective:   Physical Exam  Alert no acute distress Heart - Regular rate and rhythm.  No murmurs, gallops or rubs.    Lungs:  Normal respiratory effort, chest expands symmetrically. Lungs are clear to auscultation, no crackles or wheezes.       Assessment & Plan:

## 2015-06-29 DIAGNOSIS — H524 Presbyopia: Secondary | ICD-10-CM | POA: Diagnosis not present

## 2015-06-29 DIAGNOSIS — H5210 Myopia, unspecified eye: Secondary | ICD-10-CM | POA: Diagnosis not present

## 2015-07-27 ENCOUNTER — Encounter: Payer: Self-pay | Admitting: Gastroenterology

## 2015-09-09 ENCOUNTER — Telehealth: Payer: Self-pay | Admitting: *Deleted

## 2015-09-09 NOTE — Telephone Encounter (Addendum)
Patient in nurse clinic for complaint of dizziness.  Blood pressure 132/60 manually, heart rate 73.  Patient he was at work hooking up a water hose, when he stood up he felt dizzy.  This has happen in the past.  Patient denied dizziness while in nurse office, no headache, visual changes, chest pain or SOB.  Advised patient to follow up with provider. Patient offered an appointment with provider tomorrow, but declined he wanted to see his primary doctor.  Appointment scheduled for 09/23/15 at 8:30 AM.  Clovis Pu, RN

## 2015-09-23 ENCOUNTER — Ambulatory Visit: Payer: Medicare PPO | Admitting: Family Medicine

## 2016-02-03 ENCOUNTER — Encounter: Payer: Self-pay | Admitting: Family Medicine

## 2016-02-03 ENCOUNTER — Ambulatory Visit (INDEPENDENT_AMBULATORY_CARE_PROVIDER_SITE_OTHER): Payer: Commercial Managed Care - HMO | Admitting: Family Medicine

## 2016-02-03 VITALS — BP 115/63 | HR 60 | Temp 97.7°F | Ht 65.0 in | Wt 143.0 lb

## 2016-02-03 DIAGNOSIS — Z23 Encounter for immunization: Secondary | ICD-10-CM | POA: Diagnosis not present

## 2016-02-03 DIAGNOSIS — R350 Frequency of micturition: Secondary | ICD-10-CM | POA: Diagnosis not present

## 2016-02-03 DIAGNOSIS — N400 Enlarged prostate without lower urinary tract symptoms: Secondary | ICD-10-CM | POA: Diagnosis not present

## 2016-02-03 DIAGNOSIS — Z0000 Encounter for general adult medical examination without abnormal findings: Secondary | ICD-10-CM

## 2016-02-03 MED ORDER — ZOSTER VACCINE LIVE 19400 UNT/0.65ML ~~LOC~~ SOLR: 0.6500 mL | Freq: Once | SUBCUTANEOUS | Status: DC

## 2016-02-03 MED ORDER — TAMSULOSIN HCL 0.4 MG PO CAPS: 0.4000 mg | ORAL_CAPSULE | Freq: Every day | ORAL | Status: DC

## 2016-02-03 NOTE — Assessment & Plan Note (Signed)
Continues to be smoke free.   

## 2016-02-03 NOTE — Patient Instructions (Addendum)
Good to see you today!  Thanks for coming in.  I think it would be a good idea to start back the aspirin  Keep exercising and watching your diet  Come back in one year for a checkup  Take the prescription to your pharmacy to see if they can give you a shingles shot - they will check with your insurance

## 2016-02-03 NOTE — Progress Notes (Signed)
Subjective:    Dale [name] is a 75 y.o. male who presents for Medicare Annual/Subsequent preventive examination.   Preventive Screening-Counseling & Management  Tobacco History  Smoking status  . Former Smoker -- 1.00 packs/day for 1 years  . Types: Cigarettes  . Quit date: 10/23/2011  Smokeless tobacco  . Not on file    Problems Prior to Visit 1.   Current Problems (verified) Patient Active Problem List   Diagnosis Date Noted  . Hyperlipidemia 02/04/2015  . BPH (benign prostatic hyperplasia) 10/15/2014  . GERD (gastroesophageal reflux disease) 08/07/2014  . Quit smoking 11/20/2008  . BRADYCARDIA 10/11/2007    Medications Prior to Visit Current Outpatient Prescriptions on File Prior to Visit  Medication Sig Dispense Refill  . aspirin 81 MG chewable tablet Chew 1 tablet (81 mg total) by mouth daily. 30 tablet 3  . pravastatin (PRAVACHOL) 40 MG tablet TAKE 1 TABLET BY MOUTH DAILY 30 tablet 0  . tamsulosin (FLOMAX) 0.4 MG CAPS capsule Take 1 capsule (0.4 mg total) by mouth daily after supper. 30 capsule 3   No current facility-administered medications on file prior to visit.    Current Medications (verified) Current Outpatient Prescriptions  Medication Sig Dispense Refill  . aspirin 81 MG chewable tablet Chew 1 tablet (81 mg total) by mouth daily. 30 tablet 3  . pravastatin (PRAVACHOL) 40 MG tablet TAKE 1 TABLET BY MOUTH DAILY 30 tablet 0  . tamsulosin (FLOMAX) 0.4 MG CAPS capsule Take 1 capsule (0.4 mg total) by mouth daily after supper. 30 capsule 3   No current facility-administered medications for this visit.     Allergies (verified) Review of patient's allergies indicates no known allergies.   PAST HISTORY  Family History No family history on file.  Social History Social History  Substance Use Topics  . Smoking status: Former Smoker -- 1.00 packs/day for 1 years    Types: Cigarettes    Quit date: 10/23/2011  . Smokeless tobacco: Not on file  .  Alcohol Use: Yes     Comment: occ    Are there smokers in your home (other than you)?  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  Dietary issues discussed: yes   Cardiac risk factors: advanced age (older than 14 for men, 68 for women) and male gender.  Depression Screen (Note: if answer to either of the following is "Yes", a more complete depression screening is indicated)   Q1: Over the past two weeks, have you felt down, depressed or hopeless? No  Q2: Over the past two weeks, have you felt little interest or pleasure in doing things? No  Have you lost interest or pleasure in daily life? No  Do you often feel hopeless? No  Do you cry easily over simple problems? No  Activities of Daily Living In your present state of health, do you have any difficulty performing the following activities?:  Driving? No Managing money?  No Feeding yourself? No Getting from bed to chair? No Climbing a flight of stairs? No Preparing food and eating?: No Bathing or showering? No Getting dressed: No Getting to the toilet? No Using the toilet:No Moving around from place to place: No In the past year have you fallen or had a near fall?:No   Are you sexually active?  Yes  Do you have more than one partner?  No  Hearing Difficulties: No Do you often ask people to speak up or repeat themselves? No Do  you experience ringing or noises in your ears? No Do you have difficulty understanding soft or whispered voices? No   Do you feel that you have a problem with memory? No  Do you often misplace items? No  Do you feel safe at home?  Yes  Cognitive Testing  Alert? Yes  Normal Appearance?Yes  Oriented to person? Yes  Place? Yes   Time? Yes  Recall of three objects?  Yes  Can perform simple calculations? Yes  Displays appropriate judgment?Yes  Can read the correct time from a watch face?No   Advanced Directives have been discussed with the patient?  No   List the Names of Other Physician/Practitioners you currently use: 1.    Indicate any recent Medical Services you may have received from other than [facility] providers in the past year (date may be approximate).  Immunization History  Administered Date(s) Administered  . Influenza Split 10/31/2011, 08/22/2012  . Influenza Whole 10/11/2007, 12/25/2009, 10/01/2010  . Influenza,inj,Quad PF,36+ Mos 09/18/2013, 08/15/2014  . Pneumococcal Conjugate-13 02/04/2015  . Pneumococcal Polysaccharide-23 04/16/2009  . Td 11/20/2008    Screening Tests Health Maintenance  Topic Date Due  . ZOSTAVAX  06/20/2003  . INFLUENZA VACCINE  07/13/2015  . TETANUS/TDAP  11/20/2018  . COLONOSCOPY  10/27/2019  . DTaP/Tdap/Td  Completed  . PNA vac Low Risk Adult  Completed    All answers were reviewed with the patient and necessary referrals were made:  Carney Living, MD   02/03/2016   History reviewed: allergies, current medications, past medical history, past social history and problem list  Review of Systems Patient reports no  vision/ hearing changes,anorexia, weight change, fever ,adenopathy, persistant / recurrent hoarseness, swallowing issues, chest pain, edema,persistant / recurrent cough, hemoptysis, dyspnea(rest, exertional, paroxysmal nocturnal), gastrointestinal  bleeding (melena, rectal bleeding), abdominal pain, excessive heart burn, GU symptoms(dysuria, hematuria, pyuria, voiding/incontinence  Issues) syncope, focal weakness, severe memory loss, concerning skin lesions, depression, anxiety, abnormal bruising/bleeding, major joint swelling.     Objective:     Ears:  External ear exam shows no significant lesions or deformities.  Otoscopic examination reveals clear canals, tympanic membranes are intact bilaterally without bulging, retraction, inflammation or discharge. Hearing is grossly normal bilaterall Mouth - no lesions, mucous membranes are moist, no decaying teeth +dentures  Neck:   No deformities, thyromegaly, masses, or tenderness noted.   Supple with full range of motion without pain. Heart - Regular rate and rhythm.  No murmurs, gallops or rubs.    Lungs:  Normal respiratory effort, chest expands symmetrically. Lungs are clear to auscultation, no crackles or wheezes. Abdomen: soft and non-tender without masses, organomegaly or hernias noted.  No guarding or rebound Extremities:  No cyanosis, edema, or deformity noted with good range of motion of all major joints.   Skin:  Intact without suspicious lesions or rashes  Assessment:     Normal exam      Plan:  See AVS  Medicare Attestation I have personally reviewed: The patient's medical and social history Their use of alcohol, tobacco or illicit drugs Their current medications and supplements The patient's functional ability including ADLs,fall risks, home safety risks, cognitive, and hearing and visual impairment Diet and physical activities Evidence for depression or mood disorders  The patient's weight, height, BMI, and visual acuity have been recorded in the chart.  I have made referrals, counseling, and provided education to the patient based on review of the above and I have provided the patient with a written personalized care plan for  preventive services.     Carney Living, MD   02/03/2016

## 2016-02-03 NOTE — Assessment & Plan Note (Signed)
He is not very interested in taking a cholesterol medication.  Will recheck fasting cholesterol next visit and determine if needs primary prevention

## 2016-02-03 NOTE — Assessment & Plan Note (Signed)
He notices mild urinary frequency and would like to restart flomax which he took before.  We discussed monitoring for lightheadness or low blood pressure

## 2016-03-01 DIAGNOSIS — Z0100 Encounter for examination of eyes and vision without abnormal findings: Secondary | ICD-10-CM | POA: Diagnosis not present

## 2016-05-27 ENCOUNTER — Encounter (HOSPITAL_COMMUNITY): Payer: Self-pay | Admitting: *Deleted

## 2016-05-27 ENCOUNTER — Ambulatory Visit (HOSPITAL_COMMUNITY)
Admission: EM | Admit: 2016-05-27 | Discharge: 2016-05-27 | Disposition: A | Discharge status: Home / Self Care | Payer: Commercial Managed Care - HMO | Attending: Family Medicine | Admitting: Family Medicine

## 2016-05-27 DIAGNOSIS — T63304A Toxic effect of unspecified spider venom, undetermined, initial encounter: Secondary | ICD-10-CM | POA: Diagnosis not present

## 2016-05-27 HISTORY — DX: Disorder of prostate, unspecified: N42.9

## 2016-05-27 NOTE — ED Provider Notes (Signed)
CSN: [id number]     Arrival date & time 05/27/16  1258 History   First MD Initiated Contact with Patient 05/27/16 1343     Chief Complaint  Patient presents with  . Insect Bite   (Consider location/radiation/quality/duration/timing/severity/associated sxs/prior Treatment) HPI Comments: 75 year old male states that he saw a small brown spider attach to his hat this morning about 6:30 PM and he then noticed that there was a little sting to the back of the neck. He touch the back of the neck and noticed that there was a small area of swelling. He then brushed the spider off and killed it. He has had no systemic symptoms. The only local sign is an ovoid flesh-colored raised lesion to the back of the neck measuring approximately 9 x 4 mm.   Past Medical History  Diagnosis Date  . Leg mass     rt  . Prostate disease    History reviewed. No pertinent past surgical history. History reviewed. No pertinent family history. Social History  Substance Use Topics  . Smoking status: Former Smoker -- 1.00 packs/day for 1 years    Types: Cigarettes    Quit date: 10/23/2011  . Smokeless tobacco: None  . Alcohol Use: Yes     Comment: occ    Review of Systems  Constitutional: Negative.  Negative for fever, chills, diaphoresis, activity change, appetite change and fatigue.  HENT: Negative.   Eyes: Negative.   Respiratory: Negative.  Negative for cough and shortness of breath.   Cardiovascular: Negative for chest pain and leg swelling.  Gastrointestinal: Negative.  Negative for nausea and vomiting.  Musculoskeletal: Negative.  Negative for neck pain and neck stiffness.  Skin: Negative for color change and rash.  Neurological: Negative.   Hematological: Negative for adenopathy.  Psychiatric/Behavioral: Negative.   All other systems reviewed and are negative.   Allergies  Review of patient's allergies indicates no known allergies.  Home Medications   Prior to Admission medications    Medication Sig Start Date End Date Taking? Authorizing Provider  aspirin 81 MG chewable tablet Chew 1 tablet (81 mg total) by mouth daily. 08/08/14   Pincus Large, DO  pravastatin (PRAVACHOL) 40 MG tablet TAKE 1 TABLET BY MOUTH DAILY 12/12/14   Leighton Roach McDiarmid, MD  tamsulosin (FLOMAX) 0.4 MG CAPS capsule Take 1 capsule (0.4 mg total) by mouth daily after supper. 02/03/16   Carney Living, MD  zoster vaccine live, PF, (ZOSTAVAX) 40981 UNT/0.65ML injection Inject 19,400 Units into the skin once. 02/03/16   Carney Living, MD   Meds Ordered and Administered this Visit  Medications - No data to display  BP 116/63 mmHg  Pulse 65  Temp(Src) 98.1 F (36.7 C) (Oral)  Resp 18  SpO2 98% No data found.   Physical Exam  Constitutional: He is oriented to person, place, and time. He appears well-developed and well-nourished. No distress.  HENT:  Mouth/Throat: Oropharynx is clear and moist. No oropharyngeal exudate.  The back of the neck just below the hairline small ovoid lesion as in history of present illness. Flesh-colored. No erythema, lymphangitis, induration or other changes. It appears similar to a uncomplicated mosquito bite. No drainage, bleeding or fluctuance. No signs of infection.  Eyes: EOM are normal.  Neck: Normal range of motion. Neck supple.  Cardiovascular: Normal rate, regular rhythm and normal heart sounds.   Pulmonary/Chest: Effort normal. No respiratory distress.  Musculoskeletal: He exhibits no edema.  Lymphadenopathy:    He has no cervical adenopathy.  Neurological: He is alert and oriented to person, place, and time. He exhibits normal muscle tone.  Skin: Skin is warm and dry.  Psychiatric: He has a normal mood and affect.  Nursing note and vitals reviewed.   ED Course  Procedures (including critical care time)  Labs Review Labs Reviewed - No data to display  Imaging Review No results found.   Visual Acuity Review  Right Eye Distance:   Left Eye  Distance:   Bilateral Distance:    Right Eye Near:   Left Eye Near:    Bilateral Near:         MDM   1. Spider bite, undetermined intent, initial encounter   For itching may apply Benadryl cream or gel and/or cordate 1% steroid cream over-the-counter.  Do not scratch the bite area.  Keep the bite area clean and dry. Wash the bite area with soap and water every day as told by your doctor.  If directed, apply ice to the bite area.  Put ice in a plastic bag.  Place a towel between your skin and the bag.  Leave the ice on for 20 minutes, 2-3 times per day.  Raise (elevate) the affected area above the level of your heart while you are sitting or lying down, if this is possible.  Keep all follow-up visits as told by your doctor. This is important. GET HELP IF:  Your bite does not get better after 3 days.  Your bite turns black or purple.  Near the bite, you have:  Redness.  Swelling (inflammation).  Pain that is getting worse. GET HELP RIGHT AWAY IF:  You get shortness of breath or chest pain.  You have fluid, blood, or pus coming from the bite area.  You have muscle cramps or painful muscle spasms.  You have stomach (abdominal) pain.  You feel sick to your stomach (nauseous) or you throw up (vomit).  You feel more tired or sleepy than you normally do.  Develop chills, fever, generalized rash, or other sickly feeling.    Hayden Rasmussen, NP 05/27/16 1358

## 2016-05-27 NOTE — ED Notes (Signed)
Pt   Reports   He  Was  Bitten    By     A   Spider      This  Am  On  Back  Of  Neck    He  States       He      He   Saw  The  Spider  And  Killed  It    he  Has  A  Small  Lesion  pn  Back of  Neck     Slight      Swelling   Present    No itching  No  Angioedema   Appears  In no  Acute /  Severe  Distress

## 2016-05-27 NOTE — Discharge Instructions (Signed)
Spider Bite Spider bites are not common. Most spider bites do not cause serious problems. There are only a few types of spider bites that can cause serious health problems. HOME CARE Medicine  Take or apply over-the-counter and prescription medicines only as told by your doctor.  If you were given an antibiotic medicine, take or apply it as told by your doctor. Do not stop using the antibiotic even if your condition improves. General Instructions  Do not scratch the bite area.  Keep the bite area clean and dry. Wash the bite area with soap and water every day as told by your doctor.  If directed, apply ice to the bite area.  Put ice in a plastic bag.  Place a towel between your skin and the bag.  Leave the ice on for 20 minutes, 2-3 times per day.  Raise (elevate) the affected area above the level of your heart while you are sitting or lying down, if this is possible.  Keep all follow-up visits as told by your doctor. This is important. GET HELP IF:  Your bite does not get better after 3 days.  Your bite turns black or purple.  Near the bite, you have:  Redness.  Swelling (inflammation).  Pain that is getting worse. GET HELP RIGHT AWAY IF:  You get shortness of breath or chest pain.  You have fluid, blood, or pus coming from the bite area.  You have muscle cramps or painful muscle spasms.  You have stomach (abdominal) pain.  You feel sick to your stomach (nauseous) or you throw up (vomit).  You feel more tired or sleepy than you normally do.  Develop chills, fever, generalized rash, or other sickly feeling   This information is not intended to replace advice given to you by your health care provider. Make sure you discuss any questions you have with your health care provider.   Document Released: 12/31/2010 Document Revised: 08/19/2015 Document Reviewed: 04/15/2015 Elsevier Interactive Patient Education [employer]

## 2016-07-05 DIAGNOSIS — H2513 Age-related nuclear cataract, bilateral: Secondary | ICD-10-CM | POA: Diagnosis not present

## 2016-07-27 ENCOUNTER — Encounter: Payer: Self-pay | Admitting: Family Medicine

## 2016-07-27 ENCOUNTER — Ambulatory Visit (INDEPENDENT_AMBULATORY_CARE_PROVIDER_SITE_OTHER): Payer: Commercial Managed Care - HMO | Admitting: Family Medicine

## 2016-07-27 DIAGNOSIS — R4189 Other symptoms and signs involving cognitive functions and awareness: Secondary | ICD-10-CM | POA: Insufficient documentation

## 2016-07-27 DIAGNOSIS — R413 Other amnesia: Secondary | ICD-10-CM | POA: Diagnosis not present

## 2016-07-27 NOTE — Assessment & Plan Note (Addendum)
New complaint.  No signs of focal CNS disease or depression.  Will do labs and imaging. Return for more indepth congitive testing  See after visit summary

## 2016-07-27 NOTE — Patient Instructions (Signed)
Good to see you today!  Thanks for coming in.  I will call you if your lab tests are not normal.  Otherwise we will discuss them at your next visit.  Come back in 4 weeks or so  Avoid alcohol and stay active

## 2016-07-27 NOTE — Progress Notes (Signed)
Subjective  Patient is presenting with the following illnesses  Memory Loss Accompanied by his son and niece.  All are concerned he may be forgetting things like keys or house alarm more than usual.  Strong fhx of dementia.  No falls or focal weakness or speech changes or transient episodes of change in cx or fever or significant weight loss  No change in medications Drinks etoh occassionally   Chief Complaint noted Review of Symptoms - see HPI PMH - Smoking status noted.     Objective Vital Signs reviewed Alert oriented x 3 3/3 objects at 5 minutes Neurologic exam : Cn 2-7 intact Strength equal & normal in upper & lower extremities Able to walk on heels and toes.   Balance normal  Romberg normal, finger to nose  Heart - Regular rate and rhythm.  No murmurs, gallops or rubs.    Lungs:  Normal respiratory effort, chest expands symmetrically. Lungs are clear to auscultation, no crackles or wheezes. Abdomen: soft and non-tender without masses, organomegaly or hernias noted.  No guarding or rebound Extremities:  No cyanosis, edema, or deformity noted with good range of motion of all major joints.   Eye - Pupils Equal Round Reactive to light, Extraocular movements intact,  Conjunctiva without redness or discharge     Assessments/Plans  No problem-specific Assessment & Plan notes found for this encounter.   See Encounter view if individual problem A/Ps not visible See after visit summary for details of patient instuctions

## 2016-07-28 LAB — COMPREHENSIVE METABOLIC PANEL
ALBUMIN: 3.9 g/dL (ref 3.6–5.1)
ALK PHOS: 97 U/L (ref 40–115)
ALT: 8 U/L — AB (ref 9–46)
AST: 16 U/L (ref 10–35)
BUN: 12 mg/dL (ref 7–25)
CO2: 26 mmol/L (ref 20–31)
CREATININE: 1.16 mg/dL (ref 0.70–1.18)
Calcium: 9.5 mg/dL (ref 8.6–10.3)
Chloride: 104 mmol/L (ref 98–110)
Glucose, Bld: 83 mg/dL (ref 65–99)
POTASSIUM: 4.5 mmol/L (ref 3.5–5.3)
Sodium: 142 mmol/L (ref 135–146)
TOTAL PROTEIN: 6.7 g/dL (ref 6.1–8.1)
Total Bilirubin: 0.9 mg/dL (ref 0.2–1.2)

## 2016-07-28 LAB — CBC
HCT: 43.9 % (ref 38.5–50.0)
HEMOGLOBIN: 13.7 g/dL (ref 13.2–17.1)
MCH: 27.7 pg (ref 27.0–33.0)
MCHC: 31.2 g/dL — AB (ref 32.0–36.0)
MCV: 88.9 fL (ref 80.0–100.0)
MPV: 11 fL (ref 7.5–12.5)
Platelets: 236 10*3/uL (ref 140–400)
RBC: 4.94 MIL/uL (ref 4.20–5.80)
RDW: 12.9 % (ref 11.0–15.0)
WBC: 6.7 10*3/uL (ref 3.8–10.8)

## 2016-07-28 LAB — FOLATE: Folate: 14.7 ng/mL (ref >5.4)

## 2016-07-28 LAB — VITAMIN B12: VITAMIN B 12: 544 pg/mL (ref 200–1100)

## 2016-07-28 LAB — TSH: TSH: 0.86 m[IU]/L (ref 0.40–4.50)

## 2016-08-03 ENCOUNTER — Ambulatory Visit (HOSPITAL_COMMUNITY)
Admission: RE | Admit: 2016-08-03 | Discharge: 2016-08-03 | Disposition: A | Discharge status: Home / Self Care | Payer: Commercial Managed Care - HMO | Source: Ambulatory Visit | Attending: Family Medicine | Admitting: Family Medicine

## 2016-08-03 DIAGNOSIS — R413 Other amnesia: Secondary | ICD-10-CM | POA: Insufficient documentation

## 2016-08-03 DIAGNOSIS — G319 Degenerative disease of nervous system, unspecified: Secondary | ICD-10-CM | POA: Diagnosis not present

## 2016-09-13 ENCOUNTER — Ambulatory Visit (INDEPENDENT_AMBULATORY_CARE_PROVIDER_SITE_OTHER): Payer: Commercial Managed Care - HMO | Admitting: *Deleted

## 2016-09-13 DIAGNOSIS — Z23 Encounter for immunization: Secondary | ICD-10-CM | POA: Diagnosis not present

## 2016-09-14 ENCOUNTER — Encounter: Payer: Self-pay | Admitting: Family Medicine

## 2016-09-14 ENCOUNTER — Ambulatory Visit (INDEPENDENT_AMBULATORY_CARE_PROVIDER_SITE_OTHER): Payer: Commercial Managed Care - HMO | Admitting: Family Medicine

## 2016-09-14 DIAGNOSIS — M25512 Pain in left shoulder: Secondary | ICD-10-CM | POA: Diagnosis not present

## 2016-09-14 DIAGNOSIS — F0281 Dementia in other diseases classified elsewhere with behavioral disturbance: Secondary | ICD-10-CM | POA: Diagnosis not present

## 2016-09-14 DIAGNOSIS — G300 Alzheimer's disease with early onset: Secondary | ICD-10-CM

## 2016-09-14 DIAGNOSIS — F02818 Dementia in other diseases classified elsewhere, unspecified severity, with other behavioral disturbance: Secondary | ICD-10-CM

## 2016-09-14 MED ORDER — PRAVASTATIN SODIUM 40 MG PO TABS: 40.0000 mg | ORAL_TABLET | Freq: Every day | ORAL | 5 refills | Status: DC

## 2016-09-14 MED ORDER — DONEPEZIL HCL 5 MG PO TABS: 5.0000 mg | ORAL_TABLET | Freq: Every day | ORAL | 0 refills | Status: DC

## 2016-09-14 NOTE — Assessment & Plan Note (Signed)
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 Will start Aricept and follow

## 2016-09-14 NOTE — Assessment & Plan Note (Signed)
Seems consistent with strain that is slowly improving.  Will monitor

## 2016-09-14 NOTE — Progress Notes (Signed)
Subjective  Patient is presenting with the following illnesses  Left Shoulder Pain Hurt it months ago (spring?) when carrying heavy load.  Slowly improving but still hurts when moves in certain ways.  No weakness or loss of senation.  Tylenol helps.  Memory He feels it is better has not lost keys etc.  His daughter who accompanies him relates he seems to be "Meaner" than previously and has arguments with his son who is helping with finances.   No falls or trouble speaking or weight loss   Chief Complaint noted Review of Symptoms - see HPI PMH - Smoking status noted.     Objective Vital Signs reviewed Psych:  Cognition and judgment appear intact. Alert, communicative  and cooperative with normal attention span and concentration. No apparent delusions, illusions, hallucinations Left Shoulder - FROM but some pain with full internal rotation.  No rotator cuff weakness or distal weaknes  MMSE - he completed 5 or 6th grade.   Scores 4/5 for both Time and Location questions Can not do serial sevens or spell world  2/3 objects at 5 minutes Remainder of MMSE is normal    Assessments/Plans  No problem-specific Assessment & Plan notes found for this encounter.   See Encounter view if individual problem A/Ps not visible See after visit summary for details of patient instuctions

## 2016-09-14 NOTE — Patient Instructions (Signed)
Good to see you today!  Thanks for coming in.  Start the medicine aricept 5 mg every evening  Come back in 1 month to see how things are going.  I would consider having assistance with finances   Do the shoulder exercises - 3 ways - 3 times a day and take tylenol as you need it

## 2016-10-11 ENCOUNTER — Other Ambulatory Visit: Payer: Self-pay | Admitting: Family Medicine

## 2016-10-12 ENCOUNTER — Ambulatory Visit (INDEPENDENT_AMBULATORY_CARE_PROVIDER_SITE_OTHER): Payer: Commercial Managed Care - HMO | Admitting: Family Medicine

## 2016-10-12 ENCOUNTER — Encounter: Payer: Self-pay | Admitting: Family Medicine

## 2016-10-12 DIAGNOSIS — M25512 Pain in left shoulder: Secondary | ICD-10-CM

## 2016-10-12 DIAGNOSIS — G309 Alzheimer's disease, unspecified: Secondary | ICD-10-CM

## 2016-10-12 DIAGNOSIS — F0281 Dementia in other diseases classified elsewhere with behavioral disturbance: Secondary | ICD-10-CM

## 2016-10-12 DIAGNOSIS — G308 Other Alzheimer's disease: Secondary | ICD-10-CM

## 2016-10-12 NOTE — Patient Instructions (Signed)
Good to see you today!  Thanks for coming in.  Stop the aricept since it causes stomach problems  Keep active if having problems interacting with family or severe memory issues then come back and we can discuss other medications  Think about a HCPOA  Come back in 6-12 months

## 2016-10-12 NOTE — Progress Notes (Signed)
Subjective  Patient is presenting with the following illnesses  Left Shoulder Pain Improved.  Still pains him intermittently but less intensity and no weakness or fevers or pain in other joints.  Using tylenol as needed  Dementia Had stomach upset with Aricept.  Does not want to continue taking it.  Daughter who accompanied him was unsure if she noticed any difference.  His mood seems mostly stable and he is letting his children handle his finances with little resistance.  No falls or lightheadness or chest pain    Chief Complaint noted Review of Symptoms - see HPI PMH - Smoking status noted.     Objective Vital Signs reviewed Alert conversant Psych:  Cognition and judgment appear intact. Alert, communicative  and cooperative with normal attention span and concentration. No apparent delusions, illusions, hallucinations   Assessments/Plans  No problem-specific Assessment & Plan notes found for this encounter.   See Encounter view if individual problem A/Ps not visible See after visit summary for details of patient instuctions

## 2016-10-13 ENCOUNTER — Telehealth: Payer: Self-pay | Admitting: Family Medicine

## 2016-10-13 DIAGNOSIS — G308 Other Alzheimer's disease: Principal | ICD-10-CM

## 2016-10-13 DIAGNOSIS — F0281 Dementia in other diseases classified elsewhere with behavioral disturbance: Secondary | ICD-10-CM

## 2016-10-13 NOTE — Assessment & Plan Note (Signed)
Improved.  Continue exercise and tylenol as needed

## 2016-10-13 NOTE — Telephone Encounter (Signed)
Pt's daughter stated that the pt saw Dr. Deirdre Priest yesterday and the pt wanted to let Dr. Deirdre Priest know that the medicine for memory, did help him. Pt would like to try something with less side effects. Pt's family would like to get a referral for Neurology for the pt. Pt uses [facility] on [address]. Please advise. Thanks! ep

## 2016-10-13 NOTE — Telephone Encounter (Signed)
Spoke with Dale [name] his daughter was with him at the visit   Would like to be referred to neurology for a second opinion and for other options

## 2016-10-13 NOTE — Assessment & Plan Note (Signed)
Stable.  Discussed with patient and family and they are uncertain if want to try another medication.  They will consider and let me know.  Briefly discussed EOL wishes and gave them HCPOA forms

## 2016-11-17 ENCOUNTER — Telehealth: Payer: Self-pay | Admitting: Family Medicine

## 2016-11-17 NOTE — Telephone Encounter (Signed)
Needs [facility] referral:  Dr Rockne Menghini [phone] appt 11-22-16 at 10:30 Diagnosis   G30.8 Fax # [phone]

## 2016-11-21 NOTE — Telephone Encounter (Signed)
Complete. Auth # [id number]

## 2016-11-22 ENCOUNTER — Encounter: Payer: Self-pay | Admitting: Neurology

## 2016-11-22 ENCOUNTER — Ambulatory Visit (INDEPENDENT_AMBULATORY_CARE_PROVIDER_SITE_OTHER): Payer: Commercial Managed Care - HMO | Admitting: Neurology

## 2016-11-22 VITALS — BP 134/71 | HR 63 | Ht 65.0 in | Wt 140.5 lb

## 2016-11-22 DIAGNOSIS — G308 Other Alzheimer's disease: Secondary | ICD-10-CM

## 2016-11-22 DIAGNOSIS — F0281 Dementia in other diseases classified elsewhere with behavioral disturbance: Secondary | ICD-10-CM | POA: Diagnosis not present

## 2016-11-22 DIAGNOSIS — G309 Alzheimer's disease, unspecified: Principal | ICD-10-CM

## 2016-11-22 MED ORDER — DONEPEZIL HCL 10 MG PO TABS: 10.0000 mg | ORAL_TABLET | Freq: Every day | ORAL | 11 refills | Status: DC

## 2016-11-22 MED ORDER — MEMANTINE HCL 10 MG PO TABS: 10.0000 mg | ORAL_TABLET | Freq: Two times a day (BID) | ORAL | 11 refills | Status: DC

## 2016-11-22 NOTE — Progress Notes (Signed)
PATIENT: Dale [name] DOB: 07/26/1941  Chief Complaint  Patient presents with  . Memory Loss    MMSE 27/30 - 6 animals.  He is here with his daughter, [name], for his worsening memory loss.  He was unable to tolerate Aricept 5mg  daily due to upset stomach.  . PCP    Carney LivingMarshall L Chambliss, MD     HISTORICAL  Dale [name] is a [age] right-handed male, accompanied by his daughter [name], seen in refer by his primary care doctor Carney LivingMarshall L Chambliss for evaluation of memory loss, initial evaluation was on December 12th 2017.  I reviewed and summarized the referring note, he had a history of hyperlipidemia, he graduate from 6th grade, he lives by himself since 2007, he still drives, does lawn care. His father had Alzheimer's disease at age 2980s.   He was noted to have memory loss since 2015, loosing his keys, forget to turn his car key off, he made mistake paying bills, he denies gait abnormality, has good appetite, sleeps well.   I reviewed laboratory evaluations, normal folic acid B12, CBC, TSH, CMP REVIEW OF SYSTEMS: Full 14 system review of systems performed and notable only for memory loss, confusion  ALLERGIES: No Known Allergies  HOME MEDICATIONS: Current Outpatient Prescriptions  Medication Sig Dispense Refill  . aspirin 81 MG chewable tablet Chew 1 tablet (81 mg total) by mouth daily. 30 tablet 3  . pravastatin (PRAVACHOL) 40 MG tablet Take 1 tablet (40 mg total) by mouth daily. 30 tablet 5  . tamsulosin (FLOMAX) 0.4 MG CAPS capsule Take 1 capsule (0.4 mg total) by mouth daily after supper. 30 capsule 6   No current facility-administered medications for this visit.     PAST MEDICAL HISTORY: Past Medical History:  Diagnosis Date  . Hyperlipemia   . Leg mass    rt  . Memory loss   . Prostate disease     PAST SURGICAL HISTORY: History reviewed. No pertinent surgical history.  FAMILY HISTORY: Family History  Problem Relation Age of Onset  . Cancer Mother    Posey ReaUnsure of type  . Heart attack Father   . Alzheimer's disease Father     SOCIAL HISTORY:  Social History   Social History  . Marital status: Widowed    Spouse name: N/A  . Number of children: 2  . Years of education: 6th   Occupational History  . Retired    Social History Main Topics  . Smoking status: Former Smoker    Packs/day: 1.00    Years: 1.00    Types: Cigarettes    Quit date: 10/23/2011  . Smokeless tobacco: Never Used  . Alcohol use Yes     Comment: occ  . Drug use: No  . Sexual activity: Not on file   Other Topics Concern  . Not on file   Social History Narrative   Past Medical History:      EST approximately 2002         Family History:      Sister had CABG in her 2940s       Social History:      Lives with his wife Blanchard KelchVertia.  He is retired form working for city.  Does yard work with tractor and rides motorcycles. Has 2 children.  Sees his grandkids regularly.        Right-handed.   Occasional caffeine use.           PHYSICAL EXAM   Vitals:  11/22/16 1038  BP: 134/71  Pulse: 63  Weight: 140 lb 8 oz (63.7 kg)  Height: 5\' 5"  (1.651 m)    Not recorded      Body mass index is 23.38 kg/m.  PHYSICAL EXAMNIATION:  Gen: NAD, conversant, well nourised, obese, well groomed                     Cardiovascular: Regular rate rhythm, no peripheral edema, warm, nontender. Eyes: Conjunctivae clear without exudates or hemorrhage Neck: Supple, no carotid bruits. Pulmonary: Clear to auscultation bilaterally   NEUROLOGICAL EXAM:  MENTAL STATUS: Speech:    Speech is normal; fluent and spontaneous with normal comprehension.  Cognition:Mini-Mental Status Examination 27/30, animal naming 5     Orientation to time, place and person     recent and remote memory: He missed the 2/3 recalls     Normal Attention span and concentration     Normal Language, naming, repeating,spontaneous speech     Fund of knowledge   CRANIAL NERVES: CN II: Visual fields  are full to confrontation. Fundoscopic exam is normal with sharp discs and no vascular changes. Pupils are round equal and briskly reactive to light. CN III, IV, VI: extraocular movement are normal. No ptosis. CN V: Facial sensation is intact to pinprick in all 3 divisions bilaterally. Corneal responses are intact.  CN VII: Face is symmetric with normal eye closure and smile. CN VIII: Hearing is normal to rubbing fingers CN IX, X: Palate elevates symmetrically. Phonation is normal. CN XI: Head turning and shoulder shrug are intact CN XII: Tongue is midline with normal movements and no atrophy.  MOTOR: There is no pronator drift of out-stretched arms. Muscle bulk and tone are normal. Muscle strength is normal.  REFLEXES: Reflexes are 2+ and symmetric at the biceps, triceps, knees, and ankles. Plantar responses are flexor.  SENSORY: Intact to light touch, pinprick, positional sensation and vibratory sensation are intact in fingers and toes.  COORDINATION: Rapid alternating movements and fine finger movements are intact. There is no dysmetria on finger-to-nose and heel-knee-shin.    GAIT/STANCE: Posture is normal. Gait is steady with normal steps, base, arm swing, and turning. Heel and toe walking are normal. Tandem gait is normal.  Romberg is absent.   DIAGNOSTIC DATA (LABS, IMAGING, TESTING) - I reviewed patient records, labs, notes, testing and imaging myself where available.   ASSESSMENT AND PLAN  Dale [name] is a 75 y.o. male   Mild cognitive impairment  Mini-Mental Status Examination 27/30  Family history of Alzheimer's disease  Complete evaluation with MRI of the brain  Started Namenda 10 mg twice a day, and Aricept 10 mg daily  Return to clinic in 3 months   Levert Feinstein, M.D. Ph.D.  [facility] [address], [address] [place], [place] [address] Ph: [phone] Fax: [phone]  CC: Carney Living, MD

## 2016-11-24 ENCOUNTER — Other Ambulatory Visit: Payer: Self-pay | Admitting: Neurology

## 2016-11-24 DIAGNOSIS — Z77018 Contact with and (suspected) exposure to other hazardous metals: Secondary | ICD-10-CM

## 2016-12-02 ENCOUNTER — Inpatient Hospital Stay: Admission: RE | Admit: 2016-12-02 | Payer: Commercial Managed Care - HMO | Source: Ambulatory Visit

## 2016-12-02 ENCOUNTER — Other Ambulatory Visit: Payer: Commercial Managed Care - HMO

## 2016-12-20 ENCOUNTER — Ambulatory Visit
Admission: RE | Admit: 2016-12-20 | Discharge: 2016-12-20 | Disposition: A | Discharge status: Home / Self Care | Payer: Commercial Managed Care - HMO | Source: Ambulatory Visit | Attending: Neurology | Admitting: Neurology

## 2016-12-20 DIAGNOSIS — Z77018 Contact with and (suspected) exposure to other hazardous metals: Secondary | ICD-10-CM

## 2016-12-20 DIAGNOSIS — G309 Alzheimer's disease, unspecified: Principal | ICD-10-CM

## 2016-12-20 DIAGNOSIS — F0390 Unspecified dementia without behavioral disturbance: Secondary | ICD-10-CM | POA: Diagnosis not present

## 2016-12-20 DIAGNOSIS — F0281 Dementia in other diseases classified elsewhere with behavioral disturbance: Secondary | ICD-10-CM

## 2016-12-20 DIAGNOSIS — G308 Other Alzheimer's disease: Secondary | ICD-10-CM | POA: Diagnosis not present

## 2016-12-20 DIAGNOSIS — Z01818 Encounter for other preprocedural examination: Secondary | ICD-10-CM | POA: Diagnosis not present

## 2016-12-25 ENCOUNTER — Other Ambulatory Visit: Payer: Self-pay | Admitting: Family Medicine

## 2016-12-25 DIAGNOSIS — N400 Enlarged prostate without lower urinary tract symptoms: Secondary | ICD-10-CM

## 2016-12-25 DIAGNOSIS — R350 Frequency of micturition: Secondary | ICD-10-CM

## 2016-12-27 ENCOUNTER — Encounter: Payer: Self-pay | Admitting: Obstetrics and Gynecology

## 2016-12-27 ENCOUNTER — Ambulatory Visit (INDEPENDENT_AMBULATORY_CARE_PROVIDER_SITE_OTHER): Payer: Commercial Managed Care - HMO | Admitting: Obstetrics and Gynecology

## 2016-12-27 ENCOUNTER — Ambulatory Visit (HOSPITAL_COMMUNITY)
Admission: RE | Admit: 2016-12-27 | Discharge: 2016-12-27 | Disposition: A | Discharge status: Home / Self Care | Payer: Medicare HMO | Source: Ambulatory Visit | Attending: Family Medicine | Admitting: Family Medicine

## 2016-12-27 VITALS — BP 120/64 | HR 78 | Temp 98.2°F | Ht 65.0 in | Wt 146.6 lb

## 2016-12-27 DIAGNOSIS — F0390 Unspecified dementia without behavioral disturbance: Secondary | ICD-10-CM | POA: Diagnosis not present

## 2016-12-27 DIAGNOSIS — R9431 Abnormal electrocardiogram [ECG] [EKG]: Secondary | ICD-10-CM | POA: Insufficient documentation

## 2016-12-27 DIAGNOSIS — R079 Chest pain, unspecified: Secondary | ICD-10-CM

## 2016-12-27 DIAGNOSIS — I2510 Atherosclerotic heart disease of native coronary artery without angina pectoris: Secondary | ICD-10-CM | POA: Diagnosis not present

## 2016-12-27 DIAGNOSIS — Z87891 Personal history of nicotine dependence: Secondary | ICD-10-CM | POA: Diagnosis not present

## 2016-12-27 DIAGNOSIS — K219 Gastro-esophageal reflux disease without esophagitis: Secondary | ICD-10-CM | POA: Diagnosis not present

## 2016-12-27 DIAGNOSIS — I214 Non-ST elevation (NSTEMI) myocardial infarction: Secondary | ICD-10-CM | POA: Diagnosis not present

## 2016-12-27 DIAGNOSIS — Z8249 Family history of ischemic heart disease and other diseases of the circulatory system: Secondary | ICD-10-CM | POA: Diagnosis not present

## 2016-12-27 DIAGNOSIS — E785 Hyperlipidemia, unspecified: Secondary | ICD-10-CM | POA: Diagnosis not present

## 2016-12-27 DIAGNOSIS — Z79899 Other long term (current) drug therapy: Secondary | ICD-10-CM | POA: Diagnosis not present

## 2016-12-27 DIAGNOSIS — Z7982 Long term (current) use of aspirin: Secondary | ICD-10-CM | POA: Diagnosis not present

## 2016-12-27 MED ORDER — RANITIDINE HCL 150 MG PO TABS: 150.0000 mg | ORAL_TABLET | Freq: Two times a day (BID) | ORAL | 1 refills | Status: DC

## 2016-12-27 NOTE — Progress Notes (Signed)
   Subjective:   Patient ID: Dale [name], male    DOB: 01-Jul-1941, 76 y.o.   MRN: [id number]  Patient presents for Same Day Appointment  Chief Complaint  Patient presents with  . Gastroesophageal Reflux    HPI: #Chest Pain Patient started having chest discomfort on Saturday night after a party Usually states he drinks a soda and gets relief Usually drinks a soda but that didn't help like it usually would Has taken some Tums with some relief but symptoms return H/o reflux, no cardiac disease Localizes pain to the center of the chest Patient states it feels like acid reflux Denies any radiation  Nausea/vomiting: no Shortness of breath: no  Review of Systems   See HPI for ROS.   History  Smoking Status  . Former Smoker  . Packs/day: 1.00  . Years: 1.00  . Types: Cigarettes  . Quit date: 10/23/2011  Smokeless Tobacco  . Never Used    Past medical history, surgical, family, and social history reviewed and updated in the EMR as appropriate.  Objective:  BP 120/64   Pulse 78   Temp 98.2 F (36.8 C) (Oral)   Ht 5\' 5"  (1.651 m)   Wt 146 lb 9.6 oz (66.5 kg)   SpO2 96%   BMI 24.40 kg/m  Vitals and nursing note reviewed  Physical Exam  Constitutional: He is well-developed, well-nourished, and in no distress.  HENT:  Mouth/Throat: Oropharynx is clear and moist.  Cardiovascular: Normal rate, regular rhythm and normal heart sounds.   Pulmonary/Chest: Effort normal and breath sounds normal. He exhibits no tenderness.  Abdominal: Soft. He exhibits no distension. There is no tenderness. There is no guarding.   Clinic EKG interpretation - No signs of ACS, sinus bradycardia with 1st degree AV block  Assessment & Plan:  1. Chest pain, unspecified type Complaining of chest discomfort. Atypical chest pain. Clinic EKG was unremarkable. Most likely GI given patient's history of reflux. Currently not on any antiacid. Will start patient on Zantac to help with symptoms. Counseled  patient on foods to avoid to help wit reflux. Return precautions given. Follow-up with PCP.  - EKG 12-Lead  PATIENT EDUCATION PROVIDED: See AVS   Caryl Ada, DO 12/27/2016, 3:09 PM PGY-3, [facility]

## 2016-12-27 NOTE — Patient Instructions (Signed)
Gastroesophageal Reflux Disease, Adult Introduction Normally, food travels down the esophagus and stays in the stomach to be digested. If a person has gastroesophageal reflux disease (GERD), food and stomach acid move back up into the esophagus. When this happens, the esophagus becomes sore and swollen (inflamed). Over time, GERD can make small holes (ulcers) in the lining of the esophagus. Follow these instructions at home: Diet  Follow a diet as told by your doctor. You may need to avoid foods and drinks such as:  Coffee and tea (with or without caffeine).  Drinks that contain alcohol.  Energy drinks and sports drinks.  Carbonated drinks or sodas.  Chocolate and cocoa.  Peppermint and mint flavorings.  Garlic and onions.  Horseradish.  Spicy and acidic foods, such as peppers, chili powder, curry powder, vinegar, hot sauces, and BBQ sauce.  Citrus fruit juices and citrus fruits, such as oranges, lemons, and limes.  Tomato-based foods, such as red sauce, chili, salsa, and pizza with red sauce.  Fried and fatty foods, such as donuts, french fries, potato chips, and high-fat dressings.  High-fat meats, such as hot dogs, rib eye steak, sausage, ham, and bacon.  High-fat dairy items, such as whole milk, butter, and cream cheese.  Eat small meals often. Avoid eating large meals.  Avoid drinking large amounts of liquid with your meals.  Avoid eating meals during the 2-3 hours before bedtime.  Avoid lying down right after you eat.  Do not exercise right after you eat. General instructions  Pay attention to any changes in your symptoms.  Take over-the-counter and prescription medicines only as told by your doctor. Do not take aspirin, ibuprofen, or other NSAIDs unless your doctor says it is okay.  Do not use any tobacco products, including cigarettes, chewing tobacco, and e-cigarettes. If you need help quitting, ask your doctor.  Wear loose clothes. Do not wear anything  tight around your waist.  Raise (elevate) the head of your bed about 6 inches (15 cm).  Try to lower your stress. If you need help doing this, ask your doctor.  If you are overweight, lose an amount of weight that is healthy for you. Ask your doctor about a safe weight loss goal.  Keep all follow-up visits as told by your doctor. This is important. Contact a doctor if:  You have new symptoms.  You lose weight and you do not know why it is happening.  You have trouble swallowing, or it hurts to swallow.  You have wheezing or a cough that keeps happening.  Your symptoms do not get better with treatment.  You have a hoarse voice. Get help right away if:  You have pain in your arms, neck, jaw, teeth, or back.  You feel sweaty, dizzy, or light-headed.  You have chest pain or shortness of breath.  You throw up (vomit) and your throw up looks like blood or coffee grounds.  You pass out (faint).  Your poop (stool) is bloody or black.  You cannot swallow, drink, or eat. This information is not intended to replace advice given to you by your health care provider. Make sure you discuss any questions you have with your health care provider. Document Released: 05/16/2008 Document Revised: 05/05/2016 Document Reviewed: 03/25/2015  2017 Elsevier  

## 2016-12-29 DIAGNOSIS — I219 Acute myocardial infarction, unspecified: Secondary | ICD-10-CM

## 2016-12-29 HISTORY — DX: Acute myocardial infarction, unspecified: I21.9

## 2016-12-30 ENCOUNTER — Inpatient Hospital Stay (HOSPITAL_COMMUNITY)
Admission: EM | Admit: 2016-12-30 | Discharge: 2017-01-01 | DRG: 247 | Disposition: A | Discharge status: Home / Self Care | Payer: Medicare HMO | Attending: Cardiovascular Disease | Admitting: Cardiovascular Disease

## 2016-12-30 ENCOUNTER — Encounter (HOSPITAL_COMMUNITY): Payer: Self-pay | Admitting: *Deleted

## 2016-12-30 ENCOUNTER — Encounter (HOSPITAL_COMMUNITY)
Admission: EM | Disposition: A | Discharge status: Home / Self Care | Payer: Self-pay | Source: Home / Self Care | Attending: Cardiovascular Disease

## 2016-12-30 ENCOUNTER — Other Ambulatory Visit: Payer: Self-pay

## 2016-12-30 ENCOUNTER — Emergency Department (HOSPITAL_COMMUNITY): Payer: Medicare HMO

## 2016-12-30 DIAGNOSIS — Z79899 Other long term (current) drug therapy: Secondary | ICD-10-CM | POA: Diagnosis not present

## 2016-12-30 DIAGNOSIS — Z87891 Personal history of nicotine dependence: Secondary | ICD-10-CM

## 2016-12-30 DIAGNOSIS — I2510 Atherosclerotic heart disease of native coronary artery without angina pectoris: Secondary | ICD-10-CM

## 2016-12-30 DIAGNOSIS — K219 Gastro-esophageal reflux disease without esophagitis: Secondary | ICD-10-CM | POA: Diagnosis present

## 2016-12-30 DIAGNOSIS — F0390 Unspecified dementia without behavioral disturbance: Secondary | ICD-10-CM | POA: Diagnosis not present

## 2016-12-30 DIAGNOSIS — N401 Enlarged prostate with lower urinary tract symptoms: Secondary | ICD-10-CM | POA: Diagnosis present

## 2016-12-30 DIAGNOSIS — Z8249 Family history of ischemic heart disease and other diseases of the circulatory system: Secondary | ICD-10-CM | POA: Diagnosis not present

## 2016-12-30 DIAGNOSIS — I214 Non-ST elevation (NSTEMI) myocardial infarction: Secondary | ICD-10-CM | POA: Diagnosis not present

## 2016-12-30 DIAGNOSIS — E785 Hyperlipidemia, unspecified: Secondary | ICD-10-CM | POA: Diagnosis present

## 2016-12-30 DIAGNOSIS — E7800 Pure hypercholesterolemia, unspecified: Secondary | ICD-10-CM | POA: Diagnosis not present

## 2016-12-30 DIAGNOSIS — Z7982 Long term (current) use of aspirin: Secondary | ICD-10-CM

## 2016-12-30 DIAGNOSIS — R079 Chest pain, unspecified: Secondary | ICD-10-CM | POA: Diagnosis not present

## 2016-12-30 DIAGNOSIS — R413 Other amnesia: Secondary | ICD-10-CM

## 2016-12-30 DIAGNOSIS — I200 Unstable angina: Secondary | ICD-10-CM

## 2016-12-30 DIAGNOSIS — Z955 Presence of coronary angioplasty implant and graft: Secondary | ICD-10-CM

## 2016-12-30 HISTORY — PX: CARDIAC CATHETERIZATION: SHX172

## 2016-12-30 LAB — CBC
HEMATOCRIT: 43.6 % (ref 39.0–52.0)
Hemoglobin: 14 g/dL (ref 13.0–17.0)
MCH: 27.5 pg (ref 26.0–34.0)
MCHC: 32.1 g/dL (ref 30.0–36.0)
MCV: 85.5 fL (ref 78.0–100.0)
Platelets: 233 10*3/uL (ref 150–400)
RBC: 5.1 MIL/uL (ref 4.22–5.81)
RDW: 11.8 % (ref 11.5–15.5)
WBC: 11.6 10*3/uL — AB (ref 4.0–10.5)

## 2016-12-30 LAB — BASIC METABOLIC PANEL
ANION GAP: 9 (ref 5–15)
BUN: 9 mg/dL (ref 6–20)
CO2: 26 mmol/L (ref 22–32)
Calcium: 9.4 mg/dL (ref 8.9–10.3)
Chloride: 106 mmol/L (ref 101–111)
Creatinine, Ser: 0.91 mg/dL (ref 0.61–1.24)
Glucose, Bld: 130 mg/dL — ABNORMAL HIGH (ref 65–99)
POTASSIUM: 3.9 mmol/L (ref 3.5–5.1)
Sodium: 141 mmol/L (ref 135–145)

## 2016-12-30 LAB — I-STAT TROPONIN, ED: Troponin i, poc: 1.71 ng/mL (ref 0.00–0.08)

## 2016-12-30 LAB — TROPONIN I: Troponin I: 42.15 ng/mL (ref <0.03)

## 2016-12-30 LAB — POCT ACTIVATED CLOTTING TIME: ACTIVATED CLOTTING TIME: 478 s

## 2016-12-30 SURGERY — CORONARY STENT INTERVENTION

## 2016-12-30 MED ORDER — ACETAMINOPHEN 325 MG PO TABS: 650.0000 mg | ORAL_TABLET | ORAL | Status: DC | PRN

## 2016-12-30 MED ORDER — NITROGLYCERIN 0.4 MG SL SUBL: 0.4000 mg | SUBLINGUAL_TABLET | SUBLINGUAL | Status: DC | PRN

## 2016-12-30 MED ORDER — HEPARIN (PORCINE) IN NACL 2-0.9 UNIT/ML-% IJ SOLN
INTRAMUSCULAR | Status: DC | PRN
  Administered 2016-12-30: 17:00:00

## 2016-12-30 MED ORDER — IOPAMIDOL (ISOVUE-370) INJECTION 76%
INTRAVENOUS | Status: AC
  Filled 2016-12-30: qty 100

## 2016-12-30 MED ORDER — HEPARIN SODIUM (PORCINE) 1000 UNIT/ML IJ SOLN
INTRAMUSCULAR | Status: DC | PRN
  Administered 2016-12-30: 3500 [IU] via INTRAVENOUS

## 2016-12-30 MED ORDER — ASPIRIN 81 MG PO CHEW: 81.0000 mg | CHEWABLE_TABLET | Freq: Every day | ORAL | Status: DC

## 2016-12-30 MED ORDER — FENTANYL CITRATE (PF) 100 MCG/2ML IJ SOLN
INTRAMUSCULAR | Status: DC | PRN
  Administered 2016-12-30: 25 ug via INTRAVENOUS

## 2016-12-30 MED ORDER — ACETAMINOPHEN 325 MG PO TABS
650.0000 mg | ORAL_TABLET | Freq: Once | ORAL | Status: AC
  Administered 2016-12-30: 650 mg via ORAL
  Filled 2016-12-30: qty 2

## 2016-12-30 MED ORDER — HYDRALAZINE HCL 20 MG/ML IJ SOLN: 5.0000 mg | INTRAMUSCULAR | Status: AC | PRN

## 2016-12-30 MED ORDER — MIDAZOLAM HCL 2 MG/2ML IJ SOLN
INTRAMUSCULAR | Status: DC | PRN
  Administered 2016-12-30: 0.5 mg via INTRAVENOUS

## 2016-12-30 MED ORDER — NITROGLYCERIN 1 MG/10 ML FOR IR/CATH LAB
INTRA_ARTERIAL | Status: DC | PRN
Start: 2016-12-30 — End: 2016-12-30
  Administered 2016-12-30: 200 ug via INTRACORONARY

## 2016-12-30 MED ORDER — ATORVASTATIN CALCIUM 80 MG PO TABS
80.0000 mg | ORAL_TABLET | Freq: Every day | ORAL | Status: DC
Start: 2016-12-30 — End: 2017-01-01
  Administered 2016-12-30 – 2016-12-31 (×2): 80 mg via ORAL
  Filled 2016-12-30 (×2): qty 1

## 2016-12-30 MED ORDER — IOPAMIDOL (ISOVUE-370) INJECTION 76%
INTRAVENOUS | Status: DC | PRN
  Administered 2016-12-30: 165 mL

## 2016-12-30 MED ORDER — LIDOCAINE HCL (PF) 1 % IJ SOLN
INTRAMUSCULAR | Status: DC | PRN
  Administered 2016-12-30: 2 mL

## 2016-12-30 MED ORDER — FENTANYL CITRATE (PF) 100 MCG/2ML IJ SOLN
INTRAMUSCULAR | Status: AC
  Filled 2016-12-30: qty 2

## 2016-12-30 MED ORDER — LIDOCAINE HCL (PF) 1 % IJ SOLN
INTRAMUSCULAR | Status: AC
  Filled 2016-12-30: qty 30

## 2016-12-30 MED ORDER — HEPARIN BOLUS VIA INFUSION
3000.0000 [IU] | Freq: Once | INTRAVENOUS | Status: DC
  Filled 2016-12-30: qty 3000

## 2016-12-30 MED ORDER — MEMANTINE HCL 10 MG PO TABS
10.0000 mg | ORAL_TABLET | Freq: Two times a day (BID) | ORAL | Status: DC
Start: 2016-12-30 — End: 2017-01-01
  Administered 2016-12-30 – 2017-01-01 (×4): 10 mg via ORAL
  Filled 2016-12-30 (×4): qty 1

## 2016-12-30 MED ORDER — MORPHINE SULFATE (PF) 4 MG/ML IV SOLN: 4.0000 mg | Freq: Once | INTRAVENOUS | Status: DC

## 2016-12-30 MED ORDER — HEPARIN SODIUM (PORCINE) 1000 UNIT/ML IJ SOLN
INTRAMUSCULAR | Status: AC
  Filled 2016-12-30: qty 1

## 2016-12-30 MED ORDER — FAMOTIDINE 20 MG PO TABS
20.0000 mg | ORAL_TABLET | Freq: Once | ORAL | Status: AC
  Administered 2016-12-30: 20 mg via ORAL
  Filled 2016-12-30: qty 1

## 2016-12-30 MED ORDER — TICAGRELOR 90 MG PO TABS
ORAL_TABLET | ORAL | Status: DC | PRN
  Administered 2016-12-30: 180 mg via ORAL

## 2016-12-30 MED ORDER — FAMOTIDINE 20 MG PO TABS
10.0000 mg | ORAL_TABLET | Freq: Every day | ORAL | Status: DC
  Administered 2016-12-31 – 2017-01-01 (×2): 10 mg via ORAL
  Filled 2016-12-30 (×2): qty 1

## 2016-12-30 MED ORDER — ONDANSETRON HCL 4 MG/2ML IJ SOLN: 4.0000 mg | Freq: Once | INTRAMUSCULAR | Status: DC

## 2016-12-30 MED ORDER — MORPHINE SULFATE (PF) 2 MG/ML IV SOLN: 2.0000 mg | INTRAVENOUS | Status: DC | PRN

## 2016-12-30 MED ORDER — TICAGRELOR 90 MG PO TABS
90.0000 mg | ORAL_TABLET | Freq: Two times a day (BID) | ORAL | Status: DC
  Administered 2016-12-31 – 2017-01-01 (×4): 90 mg via ORAL
  Filled 2016-12-30 (×4): qty 1

## 2016-12-30 MED ORDER — ASPIRIN 81 MG PO CHEW
81.0000 mg | CHEWABLE_TABLET | Freq: Every day | ORAL | Status: DC
  Administered 2016-12-31 – 2017-01-01 (×2): 81 mg via ORAL
  Filled 2016-12-30 (×2): qty 1

## 2016-12-30 MED ORDER — SODIUM CHLORIDE 0.9 % IV SOLN
INTRAVENOUS | Status: DC | PRN
  Administered 2016-12-30: 1.75 mg/kg/h via INTRAVENOUS
  Administered 2016-12-30: 18:00:00

## 2016-12-30 MED ORDER — ONDANSETRON HCL 4 MG/2ML IJ SOLN: 4.0000 mg | Freq: Four times a day (QID) | INTRAMUSCULAR | Status: DC | PRN

## 2016-12-30 MED ORDER — TICAGRELOR 90 MG PO TABS
ORAL_TABLET | ORAL | Status: AC
  Filled 2016-12-30: qty 2

## 2016-12-30 MED ORDER — DONEPEZIL HCL 10 MG PO TABS
10.0000 mg | ORAL_TABLET | Freq: Every day | ORAL | Status: DC
  Administered 2016-12-30 – 2016-12-31 (×2): 10 mg via ORAL
  Filled 2016-12-30 (×2): qty 1

## 2016-12-30 MED ORDER — METOPROLOL TARTRATE 12.5 MG HALF TABLET
12.5000 mg | ORAL_TABLET | Freq: Two times a day (BID) | ORAL | Status: DC
  Administered 2016-12-31 – 2017-01-01 (×2): 12.5 mg via ORAL
  Filled 2016-12-30 (×3): qty 1

## 2016-12-30 MED ORDER — ANGIOPLASTY BOOK
Freq: Once | Status: AC
  Administered 2016-12-31: 06:00:00
  Filled 2016-12-30: qty 1

## 2016-12-30 MED ORDER — BIVALIRUDIN 250 MG IV SOLR
INTRAVENOUS | Status: AC
  Filled 2016-12-30: qty 250

## 2016-12-30 MED ORDER — HEPARIN (PORCINE) IN NACL 100-0.45 UNIT/ML-% IJ SOLN
800.0000 [IU]/h | INTRAMUSCULAR | Status: DC
  Filled 2016-12-30: qty 250

## 2016-12-30 MED ORDER — VERAPAMIL HCL 2.5 MG/ML IV SOLN
INTRAVENOUS | Status: AC
Start: 2016-12-30 — End: 2016-12-30
  Filled 2016-12-30: qty 2

## 2016-12-30 MED ORDER — ATORVASTATIN CALCIUM 40 MG PO TABS: 40.0000 mg | ORAL_TABLET | Freq: Every day | ORAL | Status: DC

## 2016-12-30 MED ORDER — SODIUM CHLORIDE 0.9 % IV SOLN: 250.0000 mL | INTRAVENOUS | Status: DC | PRN

## 2016-12-30 MED ORDER — SODIUM CHLORIDE 0.9 % IV SOLN
1.7500 mg/kg/h | INTRAVENOUS | Status: AC
  Filled 2016-12-30 (×2): qty 250

## 2016-12-30 MED ORDER — ASPIRIN 81 MG PO CHEW
324.0000 mg | CHEWABLE_TABLET | Freq: Once | ORAL | Status: AC
  Administered 2016-12-30: 324 mg via ORAL
  Filled 2016-12-30: qty 4

## 2016-12-30 MED ORDER — SODIUM CHLORIDE 0.9% FLUSH: 3.0000 mL | INTRAVENOUS | Status: DC | PRN

## 2016-12-30 MED ORDER — HEPARIN (PORCINE) IN NACL 2-0.9 UNIT/ML-% IJ SOLN
INTRAMUSCULAR | Status: AC
  Filled 2016-12-30: qty 1500

## 2016-12-30 MED ORDER — SODIUM CHLORIDE 0.9 % IV SOLN: INTRAVENOUS | Status: AC

## 2016-12-30 MED ORDER — SODIUM CHLORIDE 0.9% FLUSH
3.0000 mL | Freq: Two times a day (BID) | INTRAVENOUS | Status: DC
  Administered 2016-12-31 – 2017-01-01 (×2): 3 mL via INTRAVENOUS

## 2016-12-30 MED ORDER — NITROGLYCERIN 1 MG/10 ML FOR IR/CATH LAB
INTRA_ARTERIAL | Status: AC
  Filled 2016-12-30: qty 10

## 2016-12-30 MED ORDER — BIVALIRUDIN BOLUS VIA INFUSION - CUPID
INTRAVENOUS | Status: DC | PRN
  Administered 2016-12-30: 50.025 mg via INTRAVENOUS

## 2016-12-30 MED ORDER — VERAPAMIL HCL 2.5 MG/ML IV SOLN
INTRA_ARTERIAL | Status: DC | PRN
  Administered 2016-12-30 (×2): 5 mL via INTRA_ARTERIAL

## 2016-12-30 MED ORDER — MIDAZOLAM HCL 2 MG/2ML IJ SOLN
INTRAMUSCULAR | Status: AC
  Filled 2016-12-30: qty 2

## 2016-12-30 MED ORDER — GI COCKTAIL ~~LOC~~
30.0000 mL | Freq: Once | ORAL | Status: AC
  Administered 2016-12-30: 30 mL via ORAL
  Filled 2016-12-30: qty 30

## 2016-12-30 MED ORDER — TAMSULOSIN HCL 0.4 MG PO CAPS
0.4000 mg | ORAL_CAPSULE | Freq: Every day | ORAL | Status: DC
  Administered 2016-12-30 – 2016-12-31 (×2): 0.4 mg via ORAL
  Filled 2016-12-30 (×2): qty 1

## 2016-12-30 MED ORDER — LABETALOL HCL 5 MG/ML IV SOLN: 10.0000 mg | INTRAVENOUS | Status: AC | PRN

## 2016-12-30 SURGICAL SUPPLY — 25 items
BALLN EMERGE MR 2.0X8 (BALLOONS) ×3
BALLN MOZEC 2.0X12 (BALLOONS) ×2
BALLN ~~LOC~~ EMERGE MR 2.5X12 (BALLOONS) ×3
BALLOON EMERGE MR 2.0X8 (BALLOONS) IMPLANT
BALLOON MOZEC 2.0X12 (BALLOONS) IMPLANT
BALLOON ~~LOC~~ EMERGE MR 2.5X12 (BALLOONS) IMPLANT
CATH INFINITI 5FR ANG PIGTAIL (CATHETERS) IMPLANT
CATH OPTITORQUE TIG 4.0 5F (CATHETERS) ×2 IMPLANT
CATH VISTA GUIDE 6FR XB3 (CATHETERS) ×2 IMPLANT
CATH VISTA GUIDE 6FR XB3.5 (CATHETERS) ×2 IMPLANT
DEVICE RAD COMP TR BAND LRG (VASCULAR PRODUCTS) ×2 IMPLANT
GLIDESHEATH SLEND A-KIT 6F 22G (SHEATH) ×2 IMPLANT
GUIDEWIRE INQWIRE 1.5J.035X260 (WIRE) IMPLANT
INQWIRE 1.5J .035X260CM (WIRE) ×6
KIT ENCORE 26 ADVANTAGE (KITS) ×2 IMPLANT
KIT HEART LEFT (KITS) ×3 IMPLANT
PACK CARDIAC CATHETERIZATION (CUSTOM PROCEDURE TRAY) ×3 IMPLANT
STENT RESOLUTE ONYX 2.25X18 (Permanent Stent) ×2 IMPLANT
SYR MEDRAD MARK V 150ML (SYRINGE) IMPLANT
TRANSDUCER W/STOPCOCK (MISCELLANEOUS) ×3 IMPLANT
TUBING CIL FLEX 10 FLL-RA (TUBING) ×3 IMPLANT
WIRE ASAHI FIELDER XT 190CM (WIRE) ×2 IMPLANT
WIRE ASAHI PROWATER 180CM (WIRE) ×4 IMPLANT
WIRE HI TORQ VERSACORE-J 145CM (WIRE) ×2 IMPLANT
WIRE HI TORQ WHISPER MS 190CM (WIRE) ×2 IMPLANT

## 2016-12-30 NOTE — Discharge Instructions (Signed)
Transfer to [facility] 

## 2016-12-30 NOTE — H&P (Signed)
History & Physical    Patient ID: Dale [name] MRN: [id number], DOB/AGE: Apr 29, 1941   Admit date: 12/30/2016   Primary Physician: Carney Living, MD Primary Cardiologist: Previously seen by Dr. Elease Hashimoto in 2015   History of Present Illness    Dale [name] is a 76 y.o. male with past medical history of HLD and prior tobacco use who presents to [facility] on 12/30/2016 as a transfer from [facility].   Was last seen in consult by Cardiology in 07/2014 for chest discomfort. Nuclear stress test was normal at that time showing no evidence of ischemia.   In talking with the patient today, he reports having chest discomfort since this past Saturday night when he was out dancing. The pain has been off and on since then, occurring at rest and with exertion. Reports associated dyspnea and nausea. Sometimes the pain is worse when he is taking a deep breath.   He reports still having active pain at this time. Was given SL NTG en route to [facility] with some improvement in his symptoms.   Denies any known history of CAD or MI's. Father had CAD. He does have a 20 pack-year history but quit smoking 10+ years ago. Occasional alcohol use. Denies any recent drug use.   Labs show a WBC of 11.6, Hgb 14.0, and platelets 233. K+ 3.9. Creatinine 0.91. Initial troponin at 1.71. EKG shows NSR, HR 70, with TWI in the lateral leads which is new from prior tracings.   He was started on Heparin and transferred to [facility] for a cardiac catheterization.    Past Medical History    Past Medical History:  Diagnosis Date  . Hyperlipemia   . Leg mass    rt  . Memory loss   . Prostate disease     History reviewed. No pertinent surgical history.   Allergies  No Known Allergies   Home Medications    Prior to Admission medications   Medication Sig Start Date End Date Taking? Authorizing Provider  aspirin 81 MG chewable tablet Chew 1 tablet (81 mg total) by mouth daily. 08/08/14  Yes Pincus Large, DO  donepezil (ARICEPT) 10 MG tablet Take 1 tablet (10 mg total) by mouth at bedtime. 11/22/16  Yes Levert Feinstein, MD  memantine (NAMENDA) 10 MG tablet Take 1 tablet (10 mg total) by mouth 2 (two) times daily. 11/22/16  Yes Levert Feinstein, MD  pravastatin (PRAVACHOL) 40 MG tablet Take 1 tablet (40 mg total) by mouth daily. Patient taking differently: Take 40 mg by mouth at bedtime.  09/14/16  Yes Carney Living, MD  ranitidine (ZANTAC) 150 MG tablet Take 1 tablet (150 mg total) by mouth 2 (two) times daily. 12/27/16  Yes Pincus Large, DO  tamsulosin (FLOMAX) 0.4 MG CAPS capsule TAKE 1 CAPSULE(0.4 MG) BY MOUTH DAILY AFTER SUPPER 12/26/16  Yes Carney Living, MD    Family History    Family History  Problem Relation Age of Onset  . Cancer Mother     Posey Rea of type  . Heart attack Father   . Alzheimer's disease Father     Social History    Social History   Social History  . Marital status: Widowed    Spouse name: N/A  . Number of children: 2  . Years of education: 6th   Occupational History  . Retired    Social History Main Topics  . Smoking status: Former Smoker  Packs/day: 1.00    Years: 20.00    Types: Cigarettes    Quit date: 10/23/2011  . Smokeless tobacco: Never Used  . Alcohol use Yes     Comment: occ  . Drug use: No  . Sexual activity: Not on file   Other Topics Concern  . Not on file   Social History Narrative   Past Medical History:      EST approximately 2002         Family History:      Sister had CABG in her 38s       Social History:      Lives with his wife Dale [name].  He is retired form working for city.  Does yard work with tractor and rides motorcycles. Has 2 children.  Sees his grandkids regularly.        Right-handed.   Occasional caffeine use.           Review of Systems    General:  No chills, fever, night sweats or weight changes.  Cardiovascular:  No edema, orthopnea, palpitations, paroxysmal nocturnal dyspnea. Positive  for chest pain and dyspnea on exertion.  Dermatological: No rash, lesions/masses Respiratory: No cough, Positive for dyspnea Urologic: No hematuria, dysuria Abdominal:   No vomiting, diarrhea, bright red blood per rectum, melena, or hematemesis. Positive for nausea.  Neurologic:  No visual changes, wkns, changes in mental status. All other systems reviewed and are otherwise negative except as noted above.  Physical Exam    Blood pressure 189/87, pulse 67, temperature 98 F (36.7 C), temperature source Oral, resp. rate 18, height 5\' 5"  (1.651 m), weight 147 lb (66.7 kg), SpO2 100 %.  General: Well developed, well nourished African American male appearing in no acute distress. Head: Normocephalic, atraumatic, sclera non-icteric, no xanthomas, nares are without discharge.  Neck: No carotid bruits. JVD not elevated.  Lungs: Respirations regular and unlabored, without wheezes or rales.  Heart: Regular rate and rhythm. No S3 or S4.  No murmur, no rubs, or gallops appreciated. Abdomen: Soft, non-tender, non-distended with normoactive bowel sounds. No hepatomegaly. No rebound/guarding. No obvious abdominal masses. Msk:  Strength and tone appear normal for age. No joint deformities or effusions. Extremities: No clubbing or cyanosis. No edema.  Distal pedal pulses are 2+ bilaterally. Neuro: Alert and oriented X 3. Moves all extremities spontaneously. No focal deficits noted. Psych:  Responds to questions appropriately with a normal affect. Skin: No rashes or lesions noted  Labs    Troponin (Point of Care Test)  Recent Labs  12/30/16 1317  TROPIPOC 1.71*   No results for input(s): CKTOTAL, CKMB, TROPONINI in the last 72 hours. Lab Results  Component Value Date   WBC 11.6 (H) 12/30/2016   HGB 14.0 12/30/2016   HCT 43.6 12/30/2016   MCV 85.5 12/30/2016   PLT 233 12/30/2016     Recent Labs Lab 12/30/16 1350  NA 141  K 3.9  CL 106  CO2 26  BUN 9  CREATININE 0.91  CALCIUM 9.4    GLUCOSE 130*   Lab Results  Component Value Date   CHOL 178 08/07/2014   HDL 65 08/07/2014   LDLCALC 104 (H) 08/07/2014   TRIG 43 08/07/2014   No results found for: DDIMER  No results found for: BNP Pro B Natriuretic peptide (BNP)  Date/Time Value Ref Range Status  08/06/2014 10:04 PM 58.8 0 - 125 pg/mL Final   No results for input(s): INR in the last 72 hours.  Radiology Studies    Dg Eye Foreign Body  Result Date: 12/20/2016 CLINICAL DATA:  Screening for MRI, remote history of working with metal EXAM: ORBITS FOR FOREIGN BODY - 2 VIEW COMPARISON:  CT brain scan of 08/03/2016 FINDINGS: Views of the orbits were obtained with the patient looking to the left and looking to the right. No orbital metallic foreign body is seen. No bony abnormality is noted. The sinuses that are visualized appear clear. IMPRESSION: No evidence of metallic foreign body within the orbits. Electronically Signed   By: Dwyane Dee M.D.   On: 12/20/2016 13:58   Dg Chest 2 View  Result Date: 12/30/2016 CLINICAL DATA:  Chest pain. EXAM: CHEST  2 VIEW COMPARISON:  Ultrasound 06/06/2014. FINDINGS: Mediastinum and hilar structures normal. Lungs are clear. No pleural effusion or pneumothorax. Heart size normal. No acute bony abnormality . IMPRESSION: No acute cardiopulmonary disease . Electronically Signed   By: Maisie Fus  Register   On: 12/30/2016 11:50   Mr Brain Wo Contrast  Result Date: 12/22/2016 [facility] NEUROIMAGING REPORT STUDY DATE: 12/20/16 PATIENT NAME: FLEET HIGHAM DOB: [birth year] MRN: [id number] ORDERING CLINICIAN: Levert Feinstein, MD PhD CLINICAL HISTORY: 76 year old male with dementia. EXAM: MRI brain (without) TECHNIQUE: MRI of the brain without contrast was obtained utilizing 5 mm axial slices with T1, T2, T2 flair, SWI and diffusion weighted views.  T1 sagittal and T2 coronal views were obtained. CONTRAST: no IMAGING SITE: [facility] [address] (3 Tesla MRI)  FINDINGS: No  abnormal lesions are seen on diffusion-weighted views to suggest acute ischemia. The cortical sulci, fissures and cisterns are normal in size and appearance. Lateral, third and fourth ventricle are normal in size and appearance. No extra-axial fluid collections are seen. No evidence of mass effect or midline shift.  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 On sagittal views the posterior fossa, pituitary gland and corpus callosum are unremarkable. No evidence of intracranial hemorrhage on SWI views. The orbits and their contents, paranasal sinuses and calvarium are unremarkable.  Intracranial flow voids are present.   Mildly abnormal MRI brain (without) demonstrating: 1. 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 2. No acute findings. INTERPRETING PHYSICIAN: Suanne Marker, MD Certified in Neurology, Neurophysiology and Neuroimaging [facility] [address] [place], [place] [address] [phone]    EKG & Cardiac Imaging    EKG:  NSR, HR 70, with TWI in the lateral leads which is new from prior tracings.   ECHOCARDIOGRAM: Pending  Assessment & Plan    1. NSTEMI (non-ST elevated myocardial infarction) [facility]) - presented for evaluation of chest pain and dyspnea with exertion occurring since this past Saturday. Having active pain at the time of this encounter.  - denies any prior cardiac history (normal NST in 07/2014) but does have risk factors including HLD, prior tobacco use, and family history of CAD. - initial troponin elevated at 1.71 and EKG shows NSR, HR 70, with TWI in the lateral leads which is new from prior tracings. Continue to cycle values. Continue Heparin. - with presenting symptoms, continued pain, elevated cardiac enzymes, and abnormal EKG, will plan for a definitive evaluation with a cardiac catheterization. The patient understands that risks included but are not  limited to stroke (1 in 1000), death (1 in 1000), kidney failure [usually temporary] (1 in 500), bleeding (1 in 200), allergic reaction [possibly serious] (1 in 200).  - start low-dose BB. Will switch  statin therapy to Atorvastatin 40mg  daily.   2. HLD - on Pravachol 40mg  daily PTA. Switch to Atorvastatin 40mg  daily. - check FLP in AM.   3. Memory Deficits - A&Ox3 on examination. - continue home medication regimen.   Signed, Dale Lennox, PA-C 12/30/2016, 3:34 PM Pager: [phone]  Agree with note by Reita May  76 y/o WBM with off/on CP since Sat night. H/O HLD and remote Tob. Trop mildly elevated. EKG with subtle lat ST elev and TWI. Exam benign. Labs otherwise OK. For cath today. The patient understands that risks included but are not limited to stroke (1 in 1000), death (1 in 1000), kidney failure [usually temporary] (1 in 500), bleeding (1 in 200), allergic reaction [possibly serious] (1 in 200). The patient understands and agrees to proceed  Runell Gess, M.D., FACP, [facility], [facility] [address] [place], [place]  [address]  [phone] 12/30/2016 4:09 PM

## 2016-12-30 NOTE — ED Notes (Signed)
Attempted to draw labs x2, was unsucessful.

## 2016-12-30 NOTE — ED Notes (Signed)
Main lab/ phlebotomy notified of unable to obtain labs and will sent someone to attempt.

## 2016-12-30 NOTE — ED Notes (Signed)
RN telling MD about I-STAT results

## 2016-12-30 NOTE — Interval H&P Note (Signed)
Cath Lab Visit (complete for each Cath Lab visit)  Clinical Evaluation Leading to the Procedure:   ACS: Yes.    Non-ACS:    Anginal Classification: CCS III  Anti-ischemic medical therapy: No Therapy  Non-Invasive Test Results: No non-invasive testing performed  Prior CABG: No previous CABG      History and Physical Interval Note:  12/30/2016 4:30 PM  Dale [name]  has presented today for surgery, with the diagnosis of nstemi  The various methods of treatment have been discussed with the patient and family. After consideration of risks, benefits and other options for treatment, the patient has consented to  Procedure(s): Left Heart Cath and Coronary Angiography (N/A) as a surgical intervention .  The patient's history has been reviewed, patient examined, no change in status, stable for surgery.  I have reviewed the patient's chart and labs.  Questions were answered to the patient's satisfaction.     Nanetta Batty

## 2016-12-30 NOTE — ED Provider Notes (Addendum)
[facility]-EMERGENCY DEPT Provider Note   CSN: [id number] Arrival date & time: 12/30/16  1015     History   Chief Complaint Chief Complaint  Patient presents with  . Chest Pain    HPI Dale [name] is a 76 y.o. male.  Patient c/o mid chest pain for the past 5-6 days - states present most of the time, moderate, worse when lies flat - states he will have to get up and move around and it feels a little better.  Hx gerd. Reports negative stress test approx 2 years ago. Denies fam hx cad. Non smoker. Denies pleuritic chest pain or exertional cp. No sob or unusual doe or fatigue. Denies heartburn. Denies cough or uri c/o. No fever or chills. No leg pain or swelling.     Chest Pain   Pertinent negatives include no abdominal pain, no back pain, no cough, no fever, no headaches, no shortness of breath and no vomiting.    Past Medical History:  Diagnosis Date  . Hyperlipemia   . Leg mass    rt  . Memory loss   . Prostate disease     Patient Active Problem List   Diagnosis Date Noted  . Alzheimer's disease 07/27/2016  . Hyperlipidemia 02/04/2015  . BPH (benign prostatic hyperplasia) 10/15/2014  . GERD (gastroesophageal reflux disease) 08/07/2014  . Left shoulder pain 09/18/2013  . Quit smoking 11/20/2008  . BRADYCARDIA 10/11/2007    History reviewed. No pertinent surgical history.     Home Medications    Prior to Admission medications   Medication Sig Start Date End Date Taking? Authorizing Provider  aspirin 81 MG chewable tablet Chew 1 tablet (81 mg total) by mouth daily. 08/08/14  Yes Pincus Large, DO  donepezil (ARICEPT) 10 MG tablet Take 1 tablet (10 mg total) by mouth at bedtime. 11/22/16  Yes Levert Feinstein, MD  memantine (NAMENDA) 10 MG tablet Take 1 tablet (10 mg total) by mouth 2 (two) times daily. 11/22/16  Yes Levert Feinstein, MD  pravastatin (PRAVACHOL) 40 MG tablet Take 1 tablet (40 mg total) by mouth daily. Patient taking differently: Take 40 mg by mouth at bedtime.   09/14/16  Yes Carney Living, MD  ranitidine (ZANTAC) 150 MG tablet Take 1 tablet (150 mg total) by mouth 2 (two) times daily. 12/27/16  Yes Pincus Large, DO  tamsulosin (FLOMAX) 0.4 MG CAPS capsule TAKE 1 CAPSULE(0.4 MG) BY MOUTH DAILY AFTER SUPPER 12/26/16  Yes Carney Living, MD    Family History Family History  Problem Relation Age of Onset  . Cancer Mother     Posey Rea of type  . Heart attack Father   . Alzheimer's disease Father     Social History Social History  Substance Use Topics  . Smoking status: Former Smoker    Packs/day: 1.00    Years: 1.00    Types: Cigarettes    Quit date: 10/23/2011  . Smokeless tobacco: Never Used  . Alcohol use Yes     Comment: occ     Allergies   Patient has no known allergies.   Review of Systems Review of Systems  Constitutional: Negative for fever.  HENT: Negative for sore throat.   Eyes: Negative for redness.  Respiratory: Negative for cough and shortness of breath.   Cardiovascular: Positive for chest pain. Negative for leg swelling.  Gastrointestinal: Negative for abdominal pain and vomiting.  Genitourinary: Negative for flank pain.  Musculoskeletal: Negative for back pain and neck pain.  Skin:  Negative for rash.  Neurological: Negative for headaches.  Hematological: Does not bruise/bleed easily.  Psychiatric/Behavioral: Negative for confusion.     Physical Exam Updated Vital Signs BP 186/99 (BP Location: Left Arm)   Pulse 67   Resp 15   Ht 5\' 5"  (1.651 m)   Wt 66.7 kg   SpO2 100%   BMI 24.46 kg/m   Physical Exam  Constitutional: He appears well-developed and well-nourished. No distress.  HENT:  Mouth/Throat: Oropharynx is clear and moist.  Eyes: Conjunctivae are normal.  Neck: Neck supple. No tracheal deviation present.  Cardiovascular: Normal rate, regular rhythm, normal heart sounds and intact distal pulses.  Exam reveals no gallop and no friction rub.   No murmur heard. Pulmonary/Chest: Effort  normal and breath sounds normal. No accessory muscle usage. No respiratory distress. He exhibits no tenderness.  Abdominal: Soft. He exhibits no distension. There is no tenderness.  Musculoskeletal: He exhibits no edema or tenderness.  Neurological: He is alert.  Skin: Skin is warm and dry. He is not diaphoretic.  Psychiatric: He has a normal mood and affect.  Nursing note and vitals reviewed.    ED Treatments / Results  Labs (all labs ordered are listed, but only abnormal results are displayed) Results for orders placed or performed during the hospital encounter of 12/30/16  I-stat troponin, ED  Result Value Ref Range   Troponin i, poc 1.71 (HH) 0.00 - 0.08 ng/mL   Comment NOTIFIED PHYSICIAN    Comment 3           Dg Eye Foreign Body  Result Date: 12/20/2016 CLINICAL DATA:  Screening for MRI, remote history of working with metal EXAM: ORBITS FOR FOREIGN BODY - 2 VIEW COMPARISON:  CT brain scan of 08/03/2016 FINDINGS: Views of the orbits were obtained with the patient looking to the left and looking to the right. No orbital metallic foreign body is seen. No bony abnormality is noted. The sinuses that are visualized appear clear. IMPRESSION: No evidence of metallic foreign body within the orbits. Electronically Signed   By: Dwyane Dee M.D.   On: 12/20/2016 13:58   Dg Chest 2 View  Result Date: 12/30/2016 CLINICAL DATA:  Chest pain. EXAM: CHEST  2 VIEW COMPARISON:  Ultrasound 06/06/2014. FINDINGS: Mediastinum and hilar structures normal. Lungs are clear. No pleural effusion or pneumothorax. Heart size normal. No acute bony abnormality . IMPRESSION: No acute cardiopulmonary disease . Electronically Signed   By: Maisie Fus  Register   On: 12/30/2016 11:50   Mr Brain Wo Contrast  Result Date: 12/22/2016 [facility] NEUROIMAGING REPORT STUDY DATE: 12/20/16 [name] DOB: [birth year] MRN: [id number] ORDERING CLINICIAN: Levert Feinstein, MD PhD CLINICAL HISTORY: 76 year old  male with dementia. EXAM: MRI brain (without) TECHNIQUE: MRI of the brain without contrast was obtained utilizing 5 mm axial slices with T1, T2, T2 flair, SWI and diffusion weighted views.  T1 sagittal and T2 coronal views were obtained. CONTRAST: no IMAGING SITE: [facility] [address] (3 Tesla MRI)  FINDINGS: No abnormal lesions are seen on diffusion-weighted views to suggest acute ischemia. The cortical sulci, fissures and cisterns are normal in size and appearance. Lateral, third and fourth ventricle are normal in size and appearance. No extra-axial fluid collections are seen. No evidence of mass effect or midline shift.  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 On sagittal views the posterior fossa, pituitary gland and corpus callosum are unremarkable. No evidence of intracranial hemorrhage  on SWI views. The orbits and their contents, paranasal sinuses and calvarium are unremarkable.  Intracranial flow voids are present.   Mildly abnormal MRI brain (without) demonstrating: 1. 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 2. No acute findings. INTERPRETING PHYSICIAN: Suanne Marker, MD Certified in Neurology, Neurophysiology and Neuroimaging [facility] [address] [place], [place] [address] [phone]    EKG  EKG Interpretation  Date/Time:  Friday December 30 2016 13:02:32 EST Ventricular Rate:  63 PR Interval:    QRS Duration: 86 QT Interval:  410 QTC Calculation: 420 R Axis:   46 Text Interpretation:  Sinus rhythm Nonspecific T abnormalities, lateral leads M Confirmed by Denton Lank  MD, Caryn Bee [phone]) on 12/30/2016 1:05:03 PM       Radiology Dg Chest 2 View  Result Date: 12/30/2016 CLINICAL DATA:  Chest pain. EXAM: CHEST  2 VIEW COMPARISON:  Ultrasound 06/06/2014. FINDINGS: Mediastinum and hilar structures normal. Lungs are clear. No pleural effusion or  pneumothorax. Heart size normal. No acute bony abnormality . IMPRESSION: No acute cardiopulmonary disease . Electronically Signed   By: Maisie Fus  Register   On: 12/30/2016 11:50    Procedures Procedures (including critical care time)  Medications Ordered in ED Medications - No data to display   Initial Impression / Assessment and Plan / ED Course  I have reviewed the triage vital signs and the nursing notes.  Pertinent labs & imaging results that were available during my care of the patient were reviewed by me and considered in my medical decision making (see chart for details).  Reviewed nursing notes and prior charts for additional history.   Labs. Cxr. Ecg.  Chewable asa.   Will also try pepcid and gi meds for symptom relief.  Cp intermittently for past 5-6 days, saw pcp for same 3 days ago.  Troponin high, heparin iv bolus, gtt, cardiology consulted.   Discussed pt with Dr Rennis Golden, incl cp, markedly elev trop, abnormal ecg changed from prior - he accepts in transfer to [facility], requests stepdown bed, and they will decide on cath today, keep npo for now.   Patient and family updated.  CRITICAL CARE  RE NSTEMI, markedly elevated troponin, requiring transfer and possible emergent cath.  Performed by: Suzi Roots Total critical care time: 35 minutes Critical care time was exclusive of separately billable procedures and treating other patients. Critical care was necessary to treat or prevent imminent or life-threatening deterioration. Critical care was time spent personally by me on the following activities: development of treatment plan with patient and/or surrogate as well as nursing, discussions with consultants, evaluation of patient's response to treatment, examination of patient, obtaining history from patient or surrogate, ordering and performing treatments and interventions, ordering and review of laboratory studies, ordering and review of radiographic studies, pulse oximetry  and re-evaluation of patient's condition.     Final Clinical Impressions(s) / ED Diagnoses   Final diagnoses:  None    New Prescriptions New Prescriptions   No medications on file         Cathren Laine, MD 12/30/16 1416

## 2016-12-30 NOTE — ED Notes (Signed)
IV successful but insufficient blood sample for lab.

## 2016-12-30 NOTE — Progress Notes (Signed)
ANTICOAGULATION CONSULT NOTE - Initial Consult  Pharmacy Consult for IV heparin Indication: chest pain/ACS  No Known Allergies  Patient Measurements: Height: 5\' 5"  (165.1 cm) Weight: 147 lb (66.7 kg) IBW/kg (Calculated) : 61.5 Heparin Dosing Weight: 66.7  Vital Signs: BP: 186/99 (01/19 1236) Pulse Rate: 67 (01/19 1236)  Labs: No results for input(s): HGB, HCT, PLT, APTT, LABPROT, INR, HEPARINUNFRC, HEPRLOWMOCWT, CREATININE, CKTOTAL, CKMB, TROPONINI in the last 72 hours.  CrCl cannot be calculated (Patient's most recent lab result is older than the maximum 21 days allowed.).   Medical History: Past Medical History:  Diagnosis Date  . Hyperlipemia   . Leg mass    rt  . Memory loss   . Prostate disease     Medications:  Scheduled:  . acetaminophen  650 mg Oral Once  . aspirin  324 mg Oral Once  . famotidine  20 mg Oral Once  . gi cocktail  30 mL Oral Once   Infusions:    Assessment: 76 yo male presents to ER with CP for past 5-6 days. To start IV heparin per pharmacy dosing for possible ACS. Baseline labs currently have not been drawn yet  Goal of Therapy:  Heparin level 0.3-0.7 units/ml Monitor platelets by anticoagulation protocol: Yes   Plan:  1) After baseline labs have been drawn, start IV heparin 3000 unit bolus then 2) IV heparin 800 units/hr infusion rate thereafter 3) Check heparin level 8 hours after start of IV heparin 4) Daily heparin level and CBC while on IV heparin   Hessie Knows, PharmD, BCPS Pager [phone] 12/30/2016 1:57 PM

## 2016-12-30 NOTE — ED Triage Notes (Signed)
Pt thinks his pain started Last Sat, saw his doctor this week, pt states pain is getting worse, vomited this am, generalized cp

## 2016-12-30 NOTE — ED Notes (Addendum)
[facility] at bedside. Heparin handed to [facility] and state will start. Pt verbalized agreement for transfer. Signature pad in room not working. Family at bedside took belongings.

## 2016-12-30 NOTE — ED Notes (Signed)
Attempted blood draw in right forearm. I was only successful in drawing istat troponin. Rn aware.

## 2016-12-30 NOTE — Progress Notes (Signed)
Pt  received from [facility] via [facility] alert and complaint of CP 5 out of 10.  Pt put on monitor and IVF started. Pt waiting to talk to MD and daughter to sign consent.

## 2016-12-30 NOTE — ED Notes (Signed)
MD at bedside. 

## 2016-12-31 ENCOUNTER — Inpatient Hospital Stay (HOSPITAL_COMMUNITY): Payer: Medicare HMO

## 2016-12-31 ENCOUNTER — Other Ambulatory Visit (HOSPITAL_COMMUNITY): Payer: Commercial Managed Care - HMO

## 2016-12-31 DIAGNOSIS — I2510 Atherosclerotic heart disease of native coronary artery without angina pectoris: Secondary | ICD-10-CM

## 2016-12-31 DIAGNOSIS — E7800 Pure hypercholesterolemia, unspecified: Secondary | ICD-10-CM

## 2016-12-31 LAB — LIPID PANEL
Cholesterol: 119 mg/dL (ref 0–200)
HDL: 60 mg/dL (ref >40)
LDL CALC: 51 mg/dL (ref 0–99)
Total CHOL/HDL Ratio: 2 RATIO
Triglycerides: 41 mg/dL (ref <150)
VLDL: 8 mg/dL (ref 0–40)

## 2016-12-31 LAB — BASIC METABOLIC PANEL
ANION GAP: 6 (ref 5–15)
BUN: 7 mg/dL (ref 6–20)
CHLORIDE: 107 mmol/L (ref 101–111)
CO2: 28 mmol/L (ref 22–32)
Calcium: 8.9 mg/dL (ref 8.9–10.3)
Creatinine, Ser: 1.01 mg/dL (ref 0.61–1.24)
GFR calc non Af Amer: >60 mL/min (ref >60)
Glucose, Bld: 111 mg/dL — ABNORMAL HIGH (ref 65–99)
POTASSIUM: 3.9 mmol/L (ref 3.5–5.1)
Sodium: 141 mmol/L (ref 135–145)

## 2016-12-31 LAB — CBC
HEMATOCRIT: 38.6 % — AB (ref 39.0–52.0)
Hemoglobin: 11.9 g/dL — ABNORMAL LOW (ref 13.0–17.0)
MCH: 27.4 pg (ref 26.0–34.0)
MCHC: 30.8 g/dL (ref 30.0–36.0)
MCV: 88.7 fL (ref 78.0–100.0)
Platelets: 203 10*3/uL (ref 150–400)
RBC: 4.35 MIL/uL (ref 4.22–5.81)
RDW: 12.2 % (ref 11.5–15.5)
WBC: 9.4 10*3/uL (ref 4.0–10.5)

## 2016-12-31 LAB — ECHOCARDIOGRAM COMPLETE
CHL CUP DOP CALC LVOT VTI: 23.5 cm
E decel time: 274 msec
EERAT: 10.86
FS: 30 % (ref 28–44)
HEIGHTINCHES: 65 in
IVS/LV PW RATIO, ED: 0.84
LA diam end sys: 33 mm
LADIAMINDEX: 1.92 cm/m2
LASIZE: 33 mm
LAVOL: 40.1 mL
LAVOLA4C: 36.4 mL
LAVOLIN: 23.3 mL/m2
LVEEAVG: 10.86
LVEEMED: 10.86
LVELAT: 7.32 cm/s
LVOT SV: 81 mL
LVOT area: 3.46 cm2
LVOT diameter: 21 mm
LVOT peak grad rest: 6 mmHg
LVOT peak vel: 119 cm/s
Lateral S' vel: 10.2 cm/s
MV Dec: 274
MVPG: 3 mmHg
MVPKAVEL: 49.5 m/s
MVPKEVEL: 79.5 m/s
PW: 9.63 mm — AB (ref 0.6–1.1)
RV sys press: 22 mmHg
Reg peak vel: 219 cm/s
TAPSE: 19.6 mm
TDI e' lateral: 7.32
TDI e' medial: 7.88
TRMAXVEL: 219 cm/s
WEIGHTICAEL: 2296.31 [oz_av]

## 2016-12-31 LAB — TROPONIN I
Troponin I: 36.11 ng/mL (ref <0.03)
Troponin I: 19.83 ng/mL (ref <0.03)

## 2016-12-31 NOTE — Progress Notes (Signed)
Rounding done, no needs at this time. 

## 2016-12-31 NOTE — Progress Notes (Signed)
CARDIAC REHAB PHASE I   PRE:  Rate/Rhythm: 68  BP:   Sitting: 128/73     SaO2: 97% RA  MODE:  Ambulation: 373ft   POST:  Rate/Rhythm: 66  BP:   Sitting: 120/84     SaO2: 100%RA  1500-1552  Pt ambulated 322ft independently and maintained a steady gait and pace. Returned pt to recliner with VSs and LE elevated. Educated pt on HH diet, risk factors, activity and lifting restrictions. Discussed antiplatelets, medication management, stent cards, exercise guidelines, emergency/temperature precautions, NTG use and when to call 911. Pt verbalize understanding. Referred pt to cardiac rehab at [facility].  Arvilla Salada D Bradin Mcadory,MS,ACSM-RCEP 12/31/2016 3:51 PM

## 2016-12-31 NOTE — Progress Notes (Signed)
  Echocardiogram 2D Echocardiogram has been performed.  Dale [name] 12/31/2016, 1:38 PM

## 2016-12-31 NOTE — Progress Notes (Signed)
Rounding done, Pt has no needs at this time.

## 2016-12-31 NOTE — Progress Notes (Signed)
Offered Pt a bath. Pt stated he would like to wait until his children get here to get a bath.

## 2016-12-31 NOTE — Progress Notes (Signed)
Progress Note  [name] Date of Encounter: 12/31/2016  Primary Cardiologist: Dr. Elease Hashimoto  Subjective   Feeling weak. No chest pain or dyspnea.   Inpatient Medications    Scheduled Meds: . aspirin  81 mg Oral Daily  . atorvastatin  80 mg Oral q1800  . donepezil  10 mg Oral QHS  . famotidine  10 mg Oral Daily  . memantine  10 mg Oral BID  . metoprolol tartrate  12.5 mg Oral BID  .  morphine injection  4 mg Intravenous Once  . ondansetron (ZOFRAN) IV  4 mg Intravenous Once  . sodium chloride flush  3 mL Intravenous Q12H  . tamsulosin  0.4 mg Oral QPC supper  . ticagrelor  90 mg Oral BID   Continuous Infusions:  PRN Meds: sodium chloride, acetaminophen, morphine injection, nitroGLYCERIN, ondansetron (ZOFRAN) IV, sodium chloride flush   Vital Signs    Vitals:   12/31/16 0347 12/31/16 0500 12/31/16 0600 12/31/16 0716  BP:  112/61 (!) 114/58 109/62  Pulse: 64 66 (!) 59 66  Resp:  14 16 16   Temp: 98.6 F (37 C)   97.9 F (36.6 C)  TempSrc: Oral   Oral  SpO2:  96% 98% 97%  Weight: 143 lb 8.3 oz (65.1 kg)     Height:        Intake/Output Summary (Last 24 hours) at 12/31/16 0736 Last data filed at 12/31/16 0600  Gross per 24 hour  Intake          1205.19 ml  Output             1926 ml  Net          -720.81 ml   Filed Weights   12/30/16 1236 12/30/16 1915 12/31/16 0347  Weight: 147 lb (66.7 kg) 137 lb 9.1 oz (62.4 kg) 143 lb 8.3 oz (65.1 kg)    Telemetry    NSR with PVCs - Personally Reviewed  ECG    NSR with TWI in lateral leads - Personally Reviewed  Physical Exam   GEN: No acute distress.  Neck: No JVD Cardiac: RRR, no murmurs, rubs, or gallops. R radial cath site without hematoma. No LE edema.  Respiratory: Clear to auscultation bilaterally. GI: Soft, nontender, non-distended  MS: No edema; No deformity. Neuro:  AAOx3. Psych: Normal affect  Labs    Chemistry Recent Labs Lab 12/30/16 1350 12/31/16 0610  NA 141 141  K  3.9 3.9  CL 106 107  CO2 26 28  GLUCOSE 130* 111*  BUN 9 7  CREATININE 0.91 1.01  CALCIUM 9.4 8.9  GFRNONAA >60 >60  GFRAA >60 >60  ANIONGAP 9 6     Hematology Recent Labs Lab 12/30/16 1350 12/31/16 0610  WBC 11.6* 9.4  RBC 5.10 4.35  HGB 14.0 11.9*  HCT 43.6 38.6*  MCV 85.5 88.7  MCH 27.5 27.4  MCHC 32.1 30.8  RDW 11.8 12.2  PLT 233 203    Cardiac Enzymes Recent Labs Lab 12/30/16 1932 12/31/16 0152 12/31/16 0610  TROPONINI 42.15* 36.11* 19.83*    Recent Labs Lab 12/30/16 1317  TROPIPOC 1.71*     BNPNo results for input(s): BNP, PROBNP in the last 168 hours.   DDimer No results for input(s): DDIMER in the last 168 hours.   Radiology    Dg Chest 2 View  Result Date: 12/30/2016 CLINICAL DATA:  Chest pain. EXAM: CHEST  2 VIEW COMPARISON:  Ultrasound 06/06/2014. FINDINGS: Mediastinum and hilar structures  normal. Lungs are clear. No pleural effusion or pneumothorax. Heart size normal. No acute bony abnormality . IMPRESSION: No acute cardiopulmonary disease . Electronically Signed   By: Maisie Fus  Register   On: 12/30/2016 11:50    Cardiac Studies    Cath 12/30/16 Conclusion     Prox Cx to Mid Cx lesion, 100 %stenosed.  Post intervention, there is a 0% residual stenosis.  A stent was successfully placed.   Impression : Successful PCI and drug-eluting stenting of an occluded nondominant circumflex in the setting of a non-STEMI with dilatation of an obtuse marginal branch through the struts. Patient had TIMI-3 flow in both the circumflex and obtuse marginal branch. He tolerated the procedure well. The guidewire and catheter were removed. Sheath was removed and a TR band was placed on the right wrist. Angiomax will continue full dose for 4 hours. He will be treated with aspirin, and high-dose statin drugs. He was tachycardic and therefore beta blocker was not started. A 2-D echo will be performed. He'll be fast tracked most likely discharged Sunday or  Monday.   Patient Profile       Dale [name] is a 76 y.o. male with past medical history of HLD and prior tobacco use who presents to [facility] on 12/30/2016 as a transfer from [facility].   Assessment & Plan    1. NSTEMI - Peak of troponin 42.15. Trending down. S/p uccessful PCI and drug-eluting stenting of an occluded nondominant circumflex in the setting of a non-STEMI with dilatation of an obtuse marginal branch through the struts with TIMI 3 flow.  - Now chest pain free. Scr stable. Continue ASA, Brilinta, statin and BB. CRP I today. Pending echo.   2. HLD - 12/31/2016: Cholesterol 119; HDL 60; LDL Cholesterol 51; Triglycerides 41; VLDL 8  - Excellent profile. Changed Pravachol 40mg  daily to Atorvastatin 80mg . LDL at goal. Change to home meds vs lower lipitor dose? Per MD.     Lorelei Pont, PA  12/31/2016, 7:36 AM    I have seen, examined the patient, and reviewed the above assessment and plan.  On exam, pt is weak and frail.  Changes to above are made where necessary.  Admitted with large NSTEMI.  Echo pending.  On good medical therapy.  Will keep in hospital another 24-48 hours.  Co Sign: Hillis Range, MD 12/31/2016 8:52 AM

## 2017-01-01 MED ORDER — NITROGLYCERIN 0.4 MG SL SUBL: 0.4000 mg | SUBLINGUAL_TABLET | SUBLINGUAL | 12 refills | Status: DC | PRN

## 2017-01-01 MED ORDER — TICAGRELOR 90 MG PO TABS
90.0000 mg | ORAL_TABLET | Freq: Two times a day (BID) | ORAL | 3 refills | Status: DC
Start: 2017-01-01 — End: 2017-12-18

## 2017-01-01 MED ORDER — METOPROLOL TARTRATE 25 MG PO TABS: 12.5000 mg | ORAL_TABLET | Freq: Two times a day (BID) | ORAL | 3 refills | Status: DC

## 2017-01-01 NOTE — Discharge Summary (Signed)
Discharge Summary    Patient ID: Dale [name],  MRN: [id number], DOB/AGE: 07/03/1941 76 y.o.  Admit date: 12/30/2016 Discharge date: 01/01/2017  Primary Care Provider: Carney Living Primary Cardiologist: Dr.Nahser  Discharge Diagnoses    Active Problems:   HLD (hyperlipidemia)   NSTEMI (non-ST elevated myocardial infarction) [facility])   Memory loss   Non-STEMI (non-ST elevated myocardial infarction) (HCC)    CAD  Allergies No Known Allergies  Diagnostic Studies/Procedures    Cath 12/30/16 Conclusion     Prox Cx to Mid Cx lesion, 100 %stenosed.  Post intervention, there is a 0% residual stenosis.  A stent was successfully placed.   Impression : Successful PCI and drug-eluting stenting of an occluded nondominant circumflex in the setting of a non-STEMI with dilatation of an obtuse marginal branch through the struts. Patient had TIMI-3 flow in both the circumflex and obtuse marginal branch. He tolerated the procedure well. The guidewire and catheter were removed. Sheath was removed and a TR band was placed on the right wrist. Angiomax will continue full dose for 4 hours. He will be treated with aspirin, and high-dose statin drugs. He was tachycardic and therefore beta blocker was not started. A 2-D echo will be performed. He'll be fast tracked most likely discharged Sunday or Monday.   Echo 12/31/16 LV EF: 55% -   60%  ------------------------------------------------------------------- Indications:      CAD of native vessels 414.01.  ------------------------------------------------------------------- History:   Risk factors:  Dyslipidemia.  ------------------------------------------------------------------- Study Conclusions  - Left ventricle: The cavity size was normal. Wall thickness was   normal. Systolic function was normal. The estimated ejection   fraction was in the range of 55% to 60%. Wall motion was normal;   there were no regional wall motion  abnormalities. Left   ventricular diastolic function parameters were normal.    History of Present Illness     Dale [name] a 76 y.o.malewith past medical history of HLD and prior tobacco use who presents to [facility] on 12/30/2016 as a transfer from [facility] with NSTEMI,   He was last seen in consult by Cardiology in 07/2014 for chest discomfort. Nuclear stress test was normal at that time showing no evidence of ischemia.   In talking with the patient today, he reports having chest discomfort since this past Saturday night when he was out dancing. The pain has been off and on since then, occurring at rest and with exertion. Reports associated dyspnea and nausea. Sometimes the pain is worse when he is taking a deep breath.   He reports still having active pain at this time. Was given SL NTG with some improvement in his symptoms.   Denies any known history of CAD or MI's. Father had CAD. He does have a 20 pack-year history but quit smoking 10+ years ago. Occasional alcohol use. Denies any recent drug use.   Labs show a WBC of 11.6, Hgb 14.0, and platelets 233. K+ 3.9. Creatinine 0.91. Initial troponin at 1.71. EKG shows NSR, HR 70, with TWI in the lateral leads which is new from prior tracings.   He was started on Heparin and transferred to [facility] for a cardiac catheterization.   Hospital Course     Consultants: None  1. NSTEMI - Peak of troponin 42.15. Trending down. S/p uccessful PCI and drug-eluting stenting of an occluded nondominant circumflex in the setting of a non-STEMI with dilatation of an obtuse marginal branch through the struts with TIMI  3 flow. Echo showed normal LV function. No WM abnormality.  - Now chest pain free. Ambulating well. Scr stable. Continue ASA, Brilinta, statin and BB. CRP II as outpatient.    2. HLD - 12/31/2016: Cholesterol 119; HDL 60; LDL Cholesterol 51; Triglycerides 41; VLDL 8  - Excellent profile. Changed Pravachol 40mg  daily to  Atorvastatin 80mg  during admission. LDL at goal. Change to home meds at discharge.   The patient has been seen by Dr. Johney Frame today and deemed ready for discharge home. All follow-up appointments have been scheduled. Discharge medications are listed below.   Discharge Vitals Blood pressure 124/65, pulse 71, temperature 98.2 F (36.8 C), temperature source Oral, resp. rate 18, height 5\' 5"  (1.651 m), weight 135 lb 6.4 oz (61.4 kg), SpO2 99 %.  Filed Weights   12/30/16 1915 12/31/16 0347 01/01/17 0430  Weight: 137 lb 9.1 oz (62.4 kg) 143 lb 8.3 oz (65.1 kg) 135 lb 6.4 oz (61.4 kg)    Physical EXam GEN:No acute distress.  Neck:No JVD Cardiac:RRR, Systolic  murmurs, rubs, or gallops. R radial cath site without hematoma or  bruit. Normal pulse Respiratory:Clear to auscultation bilaterally. PI:RJJO, nontender, non-distended  MS:No edema; No deformity. Neuro:AAOx3. Psych: Normal affect  Labs & Radiologic Studies     CBC  Recent Labs  12/30/16 1350 12/31/16 0610  WBC 11.6* 9.4  HGB 14.0 11.9*  HCT 43.6 38.6*  MCV 85.5 88.7  PLT 233 203   Basic Metabolic Panel  Recent Labs  12/30/16 1350 12/31/16 0610  NA 141 141  K 3.9 3.9  CL 106 107  CO2 26 28  GLUCOSE 130* 111*  BUN 9 7  CREATININE 0.91 1.01  CALCIUM 9.4 8.9   Liver Function Tests No results for input(s): AST, ALT, ALKPHOS, BILITOT, PROT, ALBUMIN in the last 72 hours. No results for input(s): LIPASE, AMYLASE in the last 72 hours. Cardiac Enzymes  Recent Labs  12/30/16 1932 12/31/16 0152 12/31/16 0610  TROPONINI 42.15* 36.11* 19.83*   BNP Invalid input(s): POCBNP D-Dimer No results for input(s): DDIMER in the last 72 hours. Hemoglobin A1C No results for input(s): HGBA1C in the last 72 hours. Fasting Lipid Panel  Recent Labs  12/31/16 0152  CHOL 119  HDL 60  LDLCALC 51  TRIG 41  CHOLHDL 2.0   Thyroid Function Tests No results for input(s): TSH, T4TOTAL, T3FREE, THYROIDAB in the last 72  hours.  Invalid input(s): FREET3  Dg Eye Foreign Body  Result Date: 12/20/2016 CLINICAL DATA:  Screening for MRI, remote history of working with metal EXAM: ORBITS FOR FOREIGN BODY - 2 VIEW COMPARISON:  CT brain scan of 08/03/2016 FINDINGS: Views of the orbits were obtained with the patient looking to the left and looking to the right. No orbital metallic foreign body is seen. No bony abnormality is noted. The sinuses that are visualized appear clear. IMPRESSION: No evidence of metallic foreign body within the orbits. Electronically Signed   By: Dwyane Dee M.D.   On: 12/20/2016 13:58   Dg Chest 2 View  Result Date: 12/30/2016 CLINICAL DATA:  Chest pain. EXAM: CHEST  2 VIEW COMPARISON:  Ultrasound 06/06/2014. FINDINGS: Mediastinum and hilar structures normal. Lungs are clear. No pleural effusion or pneumothorax. Heart size normal. No acute bony abnormality . IMPRESSION: No acute cardiopulmonary disease . Electronically Signed   By: Maisie Fus  Register   On: 12/30/2016 11:50   Mr Brain Wo Contrast  Result Date: 12/22/2016 [facility] NEUROIMAGING REPORT STUDY DATE: 12/20/16 PATIENT NAME:  JARRAD MCLEES DOB: 1941/05/04 MRN: [id number] ORDERING CLINICIAN: Levert Feinstein, MD PhD CLINICAL HISTORY: 76 year old male with dementia. EXAM: MRI brain (without) TECHNIQUE: MRI of the brain without contrast was obtained utilizing 5 mm axial slices with T1, T2, T2 flair, SWI and diffusion weighted views.  T1 sagittal and T2 coronal views were obtained. CONTRAST: no IMAGING SITE: [facility] [address] (3 Tesla MRI)  FINDINGS: No abnormal lesions are seen on diffusion-weighted views to suggest acute ischemia. The cortical sulci, fissures and cisterns are normal in size and appearance. Lateral, third and fourth ventricle are normal in size and appearance. No extra-axial fluid collections are seen. No evidence of mass effect or midline shift.  Mild scattered periventricular and subcortical chronic  small vessel ischemic disease. On sagittal views the posterior fossa, pituitary gland and corpus callosum are unremarkable. No evidence of intracranial hemorrhage on SWI views. The orbits and their contents, paranasal sinuses and calvarium are unremarkable.  Intracranial flow voids are present.   Mildly abnormal MRI brain (without) demonstrating: 1. 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 2. No acute findings. INTERPRETING PHYSICIAN: Suanne Marker, MD Certified in Neurology, Neurophysiology and Neuroimaging [facility] [address], [address] [place], [place] [address] [phone]    Disposition   Pt is being discharged home today in good condition.  Follow-up Plans & Appointments     Discharge Instructions    AMB Referral to Cardiac Rehabilitation - Phase II    Complete by:  As directed    Diagnosis:  NSTEMI   Amb Referral to Cardiac Rehabilitation    Complete by:  As directed    Diagnosis:   NSTEMI PTCA     Diet - low sodium heart healthy    Complete by:  As directed    Discharge instructions    Complete by:  As directed    No driving for 2 weeks. No lifting over 10 lbs for 4 weeks. No sexual activity for 4 weeks. You may not return to work until cleared by your cardiologist. Keep procedure site clean & dry. If you notice increased pain, swelling, bleeding or pus, call/return!  You may shower, but no soaking baths/hot tubs/pools for 1 week.   Increase activity slowly    Complete by:  As directed       Discharge Medications   Current Discharge Medication List    START taking these medications   Details  metoprolol tartrate (LOPRESSOR) 25 MG tablet Take 0.5 tablets (12.5 mg total) by mouth 2 (two) times daily. Qty: 30 tablet, Refills: 3    nitroGLYCERIN (NITROSTAT) 0.4 MG SL tablet Place 1 tablet (0.4 mg total) under the tongue every 5 (five) minutes x 3 doses as needed for chest pain. Qty: 25 tablet,  Refills: 12    ticagrelor (BRILINTA) 90 MG TABS tablet Take 1 tablet (90 mg total) by mouth 2 (two) times daily. Qty: 180 tablet, Refills: 3      CONTINUE these medications which have NOT CHANGED   Details  aspirin 81 MG chewable tablet Chew 1 tablet (81 mg total) by mouth daily. Qty: 30 tablet, Refills: 3    donepezil (ARICEPT) 10 MG tablet Take 1 tablet (10 mg total) by mouth at bedtime. Qty: 30 tablet, Refills: 11    memantine (NAMENDA) 10 MG tablet Take 1 tablet (10 mg total) by mouth 2 (two) times daily. Qty: 60 tablet, Refills: 11    pravastatin (PRAVACHOL) 40 MG tablet  Take 1 tablet (40 mg total) by mouth daily. Qty: 30 tablet, Refills: 5    ranitidine (ZANTAC) 150 MG tablet Take 1 tablet (150 mg total) by mouth 2 (two) times daily. Qty: 60 tablet, Refills: 1    tamsulosin (FLOMAX) 0.4 MG CAPS capsule TAKE 1 CAPSULE(0.4 MG) BY MOUTH DAILY AFTER SUPPER Qty: 30 capsule, Refills: 1   Associated Diagnoses: Urinary frequency; BPH (benign prostatic hyperplasia)         Aspirin prescribed at discharge?  Yes High Intensity Statin Prescribed? (Lipitor 40-80mg  or Crestor 20-40mg ): No: Excellent lipid profile. Keep home meds.  Beta Blocker Prescribed? Yes For EF 45% or less, Was ACEI/ARB Prescribed? N/A ADP Receptor Inhibitor Prescribed? (i.e. Plavix etc.-Includes Medically Managed Patients): Yes For EF <40%, Aldosterone Inhibitor Prescribed? N/A Was EF assessed during THIS hospitalization? Yes Was Cardiac Rehab II ordered? (Included Medically managed Patients): Yes   Outstanding Labs/Studies   N/A  Duration of Discharge Encounter   Greater than 30 minutes including physician time.  Signed, Manson Passey PA-C 01/01/2017, 9:48 AM   Hillis Range MD, [facility] 01/01/2017 4:20 PM

## 2017-01-01 NOTE — Progress Notes (Signed)
Doing well today No concerns Will discharge to home with close outpatient follow-up  Hillis Range MD, [facility] 01/01/2017 8:55 AM

## 2017-01-02 ENCOUNTER — Encounter (HOSPITAL_COMMUNITY): Payer: Self-pay | Admitting: Cardiovascular Disease

## 2017-01-03 ENCOUNTER — Telehealth: Payer: Self-pay | Admitting: Family Medicine

## 2017-01-03 NOTE — Telephone Encounter (Signed)
Had a slight heart attack and was released on Sunday. He has had allergy issues and wonders what he can take that wont interfere with his meds. Please advise

## 2017-01-05 NOTE — Telephone Encounter (Signed)
LM for patient or wife to call back. Jazmin Hartsell,CMA

## 2017-01-05 NOTE — Telephone Encounter (Signed)
Please let him know He can take loratadine (Claritin) otc for his allergies.   Thanks   LC

## 2017-01-06 NOTE — Telephone Encounter (Signed)
Spoke with patient and his wife regarding his allergy concerns.  They voiced understanding on instructions given by PCP.  Patient is due to see cardiology next week and will follow up with them on this issue too. Avrohom Mckelvin,CMA

## 2017-01-09 ENCOUNTER — Ambulatory Visit: Payer: Medicare HMO | Admitting: Cardiology

## 2017-01-09 ENCOUNTER — Encounter: Payer: Self-pay | Admitting: Cardiology

## 2017-01-09 ENCOUNTER — Encounter: Payer: Medicare HMO | Admitting: Physician Assistant

## 2017-01-09 ENCOUNTER — Ambulatory Visit (INDEPENDENT_AMBULATORY_CARE_PROVIDER_SITE_OTHER): Payer: Medicare HMO | Admitting: Cardiology

## 2017-01-09 VITALS — BP 110/64 | HR 60 | Ht 65.5 in | Wt 142.5 lb

## 2017-01-09 DIAGNOSIS — Z955 Presence of coronary angioplasty implant and graft: Secondary | ICD-10-CM | POA: Diagnosis not present

## 2017-01-09 DIAGNOSIS — I214 Non-ST elevation (NSTEMI) myocardial infarction: Secondary | ICD-10-CM

## 2017-01-09 DIAGNOSIS — Z79899 Other long term (current) drug therapy: Secondary | ICD-10-CM | POA: Diagnosis not present

## 2017-01-09 DIAGNOSIS — E782 Mixed hyperlipidemia: Secondary | ICD-10-CM

## 2017-01-09 DIAGNOSIS — I2510 Atherosclerotic heart disease of native coronary artery without angina pectoris: Secondary | ICD-10-CM

## 2017-01-09 MED ORDER — PANTOPRAZOLE SODIUM 40 MG PO TBEC: 40.0000 mg | DELAYED_RELEASE_TABLET | Freq: Every day | ORAL | 11 refills | Status: DC

## 2017-01-09 NOTE — Progress Notes (Signed)
Cardiology Office Note   Date:  01/09/2017   ID:  Dale [name] 05/15/1941, MRN [id number]  PCP:  Dale Living, MD  Cardiologist:  Dr. Allyson [name]    Chief Complaint  Patient presents with  . Palpitations  . Hospitalization Follow-up      History of Present Illness: Dale [name] is a 76 y.o. male who presents for post hospital for NSTEMI and stent to LCX for 100% stenosis. pk troponin 42. EF 55-60%.  He was discharged on lopressor 12.5 BID, brilinta and aSA- he is on Pravachol with LDL of 51, HDL 60.    Today EKG with SB at 52 and now 2st degree AV block at 248 ms.  Deep T wave inversions II,III, AVF and V5-6 with inf wall MI and occl of LCX.    Today his daughter son and daughter in law with him.  He has had 2 episodes of chest pain that does not last long, though his som said one was more intense.  He also has stomach pain at times.  He is not very active, we discussed cardiac rehab and how this would help him be more active.  He is agreeable to go.  No lightheadedness or dizziness.  His diet is healthy due to his children making sure he eats correctly.  .       Past Medical History:  Diagnosis Date  . Coronary artery disease   . Hyperlipemia   . Leg mass    rt  . Memory loss   . Myocardial infarction less than 4 weeks ago 12/29/2016  . Prostate disease   . S/P angioplasty with stent to LCX     Past Surgical History:  Procedure Laterality Date  . CARDIAC CATHETERIZATION N/A 12/30/2016   Procedure: Coronary Stent Intervention;  Surgeon: Dale Gess, MD;  [facility] INVAS[facility] CV LAB;  Service: Cardiovascular;  Laterality: N/A;  . CARDIAC CATHETERIZATION N/A 12/30/2016   Procedure: Coronary Angiography;  Surgeon: Dale Gess, MD;  Location: [facility] INVASIVE CV LAB;  Service: Cardiovascular;  Laterality: N/A;  . CARDIAC CATHETERIZATION N/A 12/30/2016   Procedure: Coronary Balloon Angioplasty;  Surgeon: Dale Gess, MD;  Location: [facility] INVASIVE CV LAB;   Service: Cardiovascular;  Laterality: N/A;     Current Outpatient Prescriptions  Medication Sig Dispense Refill  . aspirin 81 MG chewable tablet Chew 1 tablet (81 mg total) by mouth daily. 30 tablet 3  . donepezil (ARICEPT) 10 MG tablet Take 1 tablet (10 mg total) by mouth at bedtime. 30 tablet 11  . memantine (NAMENDA) 10 MG tablet Take 1 tablet (10 mg total) by mouth 2 (two) times daily. 60 tablet 11  . nitroGLYCERIN (NITROSTAT) 0.4 MG SL tablet Place 1 tablet (0.4 mg total) under the tongue every 5 (five) minutes x 3 doses as needed for chest pain. 25 tablet 12  . pravastatin (PRAVACHOL) 40 MG tablet Take 1 tablet (40 mg total) by mouth daily. (Patient taking differently: Take 40 mg by mouth at bedtime. ) 30 tablet 5  . tamsulosin (FLOMAX) 0.4 MG CAPS capsule TAKE 1 CAPSULE(0.4 MG) BY MOUTH DAILY AFTER SUPPER 30 capsule 1  . ticagrelor (BRILINTA) 90 MG TABS tablet Take 1 tablet (90 mg total) by mouth 2 (two) times daily. 180 tablet 3  . pantoprazole (PROTONIX) 40 MG tablet Take 1 tablet (40 mg total) by mouth daily. 30 tablet 11   No current facility-administered medications for this visit.     Allergies:  Patient has no known allergies.    Social History:  The patient  reports that he quit smoking about 5 years ago. His smoking use included Cigarettes. He has a 20.00 pack-year smoking history. He has never used smokeless tobacco. He reports that he drinks alcohol. He reports that he does not use drugs.   Family History:  The patient's family history includes Dale [name]'s disease in his father; Dale [name] in his mother; Dale [name] in his father.    ROS:  General:no colds or fevers, no weight changes Skin:no rashes or ulcers HEENT:no blurred vision, no congestion CV:see HPI PUL:see HPI GI:no diarrhea constipation or melena, no indigestion GU:no hematuria, no dysuria MS:no joint pain, no claudication Neuro:no syncope, no lightheadedness Endo:no diabetes, no thyroid disease  Wt  Readings from Last 3 Encounters:  01/09/17 142 lb 8 oz (64.6 kg)  01/01/17 135 lb 6.4 oz (61.4 kg)  12/27/16 146 lb 9.6 oz (66.5 kg)     PHYSICAL EXAM: VS:  BP 110/64 (BP Location: Right Arm)   Pulse 60   Ht 5' 5.5" (1.664 m)   Wt 142 lb 8 oz (64.6 kg)   BMI 23.35 kg/m  , BMI Body mass index is 23.35 kg/m. General:Pleasant affect, NAD Skin:Warm and dry, brisk capillary refill HEENT:normocephalic, sclera clear, mucus membranes moist Neck:supple, no JVD, no bruits  Dale:S1S2 RRR without murmur, gallup, rub or click Lungs:clear without rales, rhonchi, or wheezes WUJ:WJXB, non tender, + BS, do not palpate liver spleen or masses Ext:no lower ext edema, 2+ pedal pulses, 2+ radial pulses- cath site without hematoma Neuro:alert and oriented X 3, MAE, follows commands, + facial symmetry    EKG:  EKG is ordered today. The ekg ordered today demonstrates EKG with SB at 52 and now 2st degreee AV block at 248 ms.  Deep T wave inversions II,III, AVF and V5-6 with inf wall MI and occl of LCX.     Recent Labs: 07/27/2016: ALT 8; TSH 0.86 12/31/2016: BUN 7; Creatinine, Ser 1.01; Hemoglobin 11.9; Platelets 203; Potassium 3.9; Sodium 141    Lipid Panel    Component Value Date/Time   CHOL 119 12/31/2016 0152   TRIG 41 12/31/2016 0152   HDL 60 12/31/2016 0152   CHOLHDL 2.0 12/31/2016 0152   VLDL 8 12/31/2016 0152   LDLCALC 51 12/31/2016 0152       Other studies Reviewed: Additional studies/ records that were reviewed today include: . ECHO: Study Conclusions  - Left ventricle: The cavity size was normal. Wall thickness was   normal. Systolic function was normal. The estimated ejection   fraction was in the range of 55% to 60%. Wall motion was normal;   there were no regional wall motion abnormalities. Left   ventricular diastolic function parameters were normal.  Cardiac cath:  Prox Cx to Mid Cx lesion, 100 %stenosed.  Post intervention, there is a 0% residual stenosis.  A  stent was successfully placed.     ASSESSMENT AND PLAN:  1.s/p NSTEMI with stent to LCX  occ chest discomfort possible due to reflux vs. Cardiac pain.  Had single vessel CAD.  Stop zantac and added protonix.  He will call if symptoms increase.  Brilinta could be playing a role in the pain.  - will notify cardiac rehab he can attend.   2. significant bradycardia with walking in and now large 1st degree AV block.  He had bradycardia in the hospital will stop the lopressor for now.    3.  Hyperlipidemia, lipids low  in the hospitla now on pravachol 40 will recheck in 6-8 weeks.    4.  Hx of memory loss. Treated  5. CAD single vessel.    Current medicines are reviewed with the patient today.  The patient Has no concerns regarding medicines.  The following changes have been made:  See above Labs/ tests ordered today include:see above  Disposition:   FU:  see above  Signed, Dale Boozer, NP  01/09/2017 5:15 PM    [facility] [address] [place], [place], [place]  [address]/ [address] [place], [place] Phone: [phone]; Fax: [phone]  [phone]

## 2017-01-09 NOTE — Patient Instructions (Addendum)
Medication Instructions:  STOP ZANTAC STOP METOPROLOL START PROTONIX 40MG  DAILY  If you need a refill on your cardiac medications before your next appointment, please call your pharmacy.  Labwork: BMP TODAY AT [facility] ON THE 1ST FLOOR  Follow-Up: Your physician recommends that you schedule a follow-up appointment in: 4 WEEKS WITH DR Allyson Sabal   Thank you for choosing [facility] at [facility]!!    Marcelino Duster, LPN Nada Boozer, NP

## 2017-01-10 LAB — BASIC METABOLIC PANEL
BUN: 11 mg/dL (ref 7–25)
CHLORIDE: 106 mmol/L (ref 98–110)
CO2: 28 mmol/L (ref 20–31)
Calcium: 8.9 mg/dL (ref 8.6–10.3)
Creat: 1.1 mg/dL (ref 0.70–1.18)
Glucose, Bld: 77 mg/dL (ref 65–99)
POTASSIUM: 4.2 mmol/L (ref 3.5–5.3)
Sodium: 144 mmol/L (ref 135–146)

## 2017-01-10 NOTE — Addendum Note (Signed)
Addended by: Alyson Ingles on: 01/10/2017 07:34 AM   Modules accepted: Orders

## 2017-01-16 ENCOUNTER — Ambulatory Visit (HOSPITAL_COMMUNITY)
Admission: RE | Admit: 2017-01-16 | Discharge: 2017-01-16 | Disposition: A | Discharge status: Home / Self Care | Payer: Medicare HMO | Source: Ambulatory Visit | Attending: Family Medicine | Admitting: Family Medicine

## 2017-01-16 ENCOUNTER — Ambulatory Visit (INDEPENDENT_AMBULATORY_CARE_PROVIDER_SITE_OTHER): Payer: Medicare HMO | Admitting: Student

## 2017-01-16 ENCOUNTER — Telehealth: Payer: Self-pay | Admitting: Family Medicine

## 2017-01-16 ENCOUNTER — Encounter: Payer: Self-pay | Admitting: Student

## 2017-01-16 VITALS — BP 130/74 | HR 62 | Temp 98.3°F | Ht 65.0 in | Wt 143.2 lb

## 2017-01-16 DIAGNOSIS — Z029 Encounter for administrative examinations, unspecified: Secondary | ICD-10-CM | POA: Diagnosis not present

## 2017-01-16 DIAGNOSIS — R079 Chest pain, unspecified: Secondary | ICD-10-CM | POA: Diagnosis not present

## 2017-01-16 NOTE — Assessment & Plan Note (Addendum)
Chest pain with diaphoresis in a patient with recent hospitalization for NSTEMI and s/p PCI and DES sent is concerning for ACS. However, patient's pain was not exertional or typical. He had no further chest pain. He walked in the neighborhood and in his backyard without chest pain or dyspnea which is reassuring. His symptoms of wooziness, room feeling dark and temporal association with bowel movements, although a little late, is concerning for presyncope (vasovagal).  Arrhythmia is another possibility in the setting of recent cardiac procedure and not on beta blocker. GERD is also on differential diagnosis given history of this, stomach pain and onset of symptoms right after breakfast and coffee.  EKG in the office with sinus bradycardia to 57 and borderline first degree AVB and T-wave changes in the lateral lead (V5 and V6). The later is not new. Patient is chest pain-free. He is already of metoprolol due to bradycardia. Advised patient to call 911 right away if he happens to have another chest pain again. He may benefit from event monitor but I defer this to his cardiologist. He has an upcoming cardiology appointment in 3 weeks.   Precepted patient with Dr. Deirdre Priest who is the preceptor for the day.

## 2017-01-16 NOTE — Telephone Encounter (Signed)
Daughter is calling and would like to speak to one of the nurse's about her father. He is having cold sweats, stomach pains, and almost passing out, She has schedule an appointment for 01/17/17 to be seen, but wanted to know what to do in the mean time. jw

## 2017-01-16 NOTE — Telephone Encounter (Signed)
Returned daughter's [name]) call. States patient is c/o cold sweats, abd pain and feeling like passing out. Patient recently hospitalized for NSTEMI on 12/30/2016. Instructed daughter to take patient directly to ED. Daughter states that these symptoms occurred earlier today after patient ate banana and drank coffee and that currently patient is at [facility]. Directed daughter to have patient come directly to [facility] for evaluation. Placed on Dr. Sundra Aland schedule. PCP, Dr. Deirdre Priest made aware. Kinnie Feil, RN, BSN

## 2017-01-16 NOTE — Progress Notes (Signed)
Subjective:    Cono is a 76 y.o. old male here with his son for chest pain.   HPI Chest pain: woke up this morning about 10 AM. Went to bathroom and had urination. Then went to kitchen and had a cup of coffee and ate something that he doesn't remember. Reports eating the same thing last night. Stood in the kitchen for 15-20 minutes. Then he went back to bathroom and had a bowel movement. Denies diarrhea. He didn't look in the comode to tell if there is blood in stool. Then came back to his living room and sat on the chair when he felt "stomach pain". He also reports some pain in his left chest. No radiation to his arms or his jaw. He could not characterize the pain but states "hurting bad". He also states feeling "woozy". He also felt warm and diaphoretic. The room felt dark. He denies palpitation, dyspnea nausea or emesis. The whole thing lasted 10-15 minutes and resolved on its own.  He walked in the neighborhood and his backyard without chest pain or dyspnea after that. He was driving to [employer] when his daughter told him to go to clinic.   Off note, patient was hospitalized for an NSTEMI from 12/30/2016 to 01/01/2017 and had PCI with DES to an occluded nondominant circumflex coronary artery. He was started on metoprolol 12.5 mg twice a day, Brilinta Aspirin and statin. Unfortunately, metoprolol was discontinued due to bradycardia.   He also have dementia and he is on Aricept and Namenda.   PMH/Problem List: has Quit smoking; BRADYCARDIA; Left shoulder pain; GERD (gastroesophageal reflux disease); BPH (benign prostatic hyperplasia); HLD (hyperlipidemia); Alzheimer's disease; NSTEMI (non-ST elevated myocardial infarction) (HCC); Memory loss; Non-STEMI (non-ST elevated myocardial infarction) [facility]); and Chest pain on his problem list.   has a past medical history of Coronary artery disease; Hyperlipemia; Leg mass; Memory loss; Myocardial infarction less than 4 weeks ago (12/29/2016); Prostate  disease; and S/P angioplasty with stent to LCX.  FH:  Family History  Problem Relation Age of Onset  . Cancer Mother     Posey Rea of type  . Heart attack Father   . Alzheimer's disease Father     SH Social History  Substance Use Topics  . Smoking status: Former Smoker    Packs/day: 1.00    Years: 20.00    Types: Cigarettes    Quit date: 10/23/2011  . Smokeless tobacco: Never Used  . Alcohol use Yes     Comment: occ    Review of Systems Review of systems negative except for pertinent positives and negatives in history of present illness above.     Objective:     Vitals:   01/16/17 1539  BP: 130/74  Pulse: 62  Temp: 98.3 F (36.8 C)  TempSrc: Oral  SpO2: 99%  Weight: 143 lb 3.2 oz (65 kg)  Height: 5\' 5"  (1.651 m)    Physical Exam GEN: appears well, no apparent distress. Eyes: conjunctiva without injection, sclera anicteric Nares: no rhinorrhea, congestion or erythema Oropharynx: mmm without erythema or exudation CVS: HR 60/min, RR, nl S1&S2, no murmurs, no edema, cap refills < 2 secs RESP: speaks in full sentence, no IWOB, CTAB GI: BS present & normal, soft, NTND NEURO: alert and oiented appropriately, no gross defecits  PSYCH: euthymic mood with congruent affect    Assessment and Plan:  Chest pain Chest pain with diaphoresis in a patient with recent hospitalization for NSTEMI and s/p PCI and DES sent is concerning for ACS.  However, patient's pain was not exertional or typical. He had no further chest pain. He walked in the neighborhood and in his backyard without chest pain or dyspnea which is reassuring. His symptoms of wooziness, room feeling dark and temporal association with bowel movements, although a little late, is concerning for presyncope (vasovagal).  Arrhythmia is another possibility in the setting of recent cardiac procedure and not on beta blocker. GERD is also on differential diagnosis given history of this, stomach pain and onset of symptoms right  after breakfast and coffee.  EKG in the office with sinus bradycardia to 57 and borderline first degree AVB and T-wave changes in the lateral lead (V5 and V6). The later is not new. Patient is chest pain-free. He is already of metoprolol due to bradycardia. Advised patient to call 911 right away if he happens to have another chest pain again. He may benefit from event monitor but I defer this to his cardiologist. He has an upcoming cardiology appointment in 3 weeks.   Precepted patient with Dr. Deirdre Priest who is the preceptor for the day.   Orders Placed This Encounter  Procedures  . EKG 12-Lead  . EKG 12-Lead    Ordered by MARSHALL CHAMBLISS     Return if symptoms worsen or fail to improve.  Almon Hercules, MD 01/16/17 Pager: [phone]

## 2017-01-16 NOTE — Patient Instructions (Signed)
It was great seeing you today! We have addressed the following issues today  1. Chest pain: it is unclear what caused your chest pain. Your EKG, the electrical activity of the heart looks normal and unchanged from previous. Please make sure you call 911 if you have chest pain again or trouble breathing.    If we did any lab work today, and the results require attention, either me or my nurse will get in touch with you. If everything is normal, you will get a letter in mail. If you don't hear from [place] in two weeks, please give [place] a call. Otherwise, we look forward to seeing you again at your next visit. If you have any questions or concerns before then, please call the clinic at [phone].   Please bring all your medications to every doctors visit   Sign up for My Chart to have easy access to your labs results, and communication with your Primary care physician.     Please check-out at the front desk before leaving the clinic.    Take Care,

## 2017-01-17 ENCOUNTER — Ambulatory Visit: Payer: Medicare HMO | Admitting: Family Medicine

## 2017-02-01 DIAGNOSIS — H524 Presbyopia: Secondary | ICD-10-CM | POA: Diagnosis not present

## 2017-02-07 ENCOUNTER — Encounter: Payer: Self-pay | Admitting: Cardiovascular Disease

## 2017-02-07 ENCOUNTER — Ambulatory Visit (INDEPENDENT_AMBULATORY_CARE_PROVIDER_SITE_OTHER): Payer: Medicare HMO | Admitting: Cardiovascular Disease

## 2017-02-07 DIAGNOSIS — I214 Non-ST elevation (NSTEMI) myocardial infarction: Secondary | ICD-10-CM

## 2017-02-07 DIAGNOSIS — E7800 Pure hypercholesterolemia, unspecified: Secondary | ICD-10-CM | POA: Diagnosis not present

## 2017-02-07 NOTE — Patient Instructions (Signed)
Medication Instructions: Your physician recommends that you continue on your current medications as directed. Please refer to the Current Medication list given to you today.   Follow-Up: We request that you follow-up in: 6 months with Nada Boozer, NP and in 12 months with Dr San Morelle will receive a reminder letter in the mail two months in advance. If you don't receive a letter, please call our office to schedule the follow-up appointment.  If you need a refill on your cardiac medications before your next appointment, please call your pharmacy.

## 2017-02-07 NOTE — Assessment & Plan Note (Signed)
History of hyperlipidemia on pravastatin with recent lipid profile performed 12/31/16 revealed total cholesterol 119, LDL 51 and HDL of 60.

## 2017-02-07 NOTE — Assessment & Plan Note (Signed)
History of non-STEMI status post recent cardiac catheterization performed by myself 12/30/16 revealing occluded AV groove circumflex status post stenting with a Medtronic Onyx drug-eluting stent. This 2-D echo was normal. He is on dual antiplatelet therapy which she takes reliably. He denies chest pain or shortness of breath.

## 2017-02-07 NOTE — Progress Notes (Signed)
02/07/2017 Dale [name]   [birth year]  [id number]  Primary Physician Lind Covert, MD Primary Cardiologist: Lorretta Harp MD Renae Gloss  HPI:  Dale [name] is a 76 year old thin appearing African-American male accompanied by his son and daughter. I first met him at the time of his non-STEMI 12/30/16. 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 The remainder of his coronary anatomy was free of significant disease and his LV function was normal. His other problems include a history of hyperlipidemia and remote tobacco abuse. He was discharged home 2 days later. He saw Cecilie Kicks in the office one week later and was doing well.   Current Outpatient Prescriptions  Medication Sig Dispense Refill  . aspirin 81 MG chewable tablet Chew 1 tablet (81 mg total) by mouth daily. 30 tablet 3  . donepezil (ARICEPT) 10 MG tablet Take 1 tablet (10 mg total) by mouth at bedtime. 30 tablet 11  . memantine (NAMENDA) 10 MG tablet Take 1 tablet (10 mg total) by mouth 2 (two) times daily. 60 tablet 11  . nitroGLYCERIN (NITROSTAT) 0.4 MG SL tablet Place 1 tablet (0.4 mg total) under the tongue every 5 (five) minutes x 3 doses as needed for chest pain. 25 tablet 12  . pantoprazole (PROTONIX) 40 MG tablet Take 1 tablet (40 mg total) by mouth daily. 30 tablet 11  . pravastatin (PRAVACHOL) 40 MG tablet Take 1 tablet (40 mg total) by mouth daily. (Patient taking differently: Take 40 mg by mouth at bedtime. ) 30 tablet 5  . tamsulosin (FLOMAX) 0.4 MG CAPS capsule TAKE 1 CAPSULE(0.4 MG) BY MOUTH DAILY AFTER SUPPER 30 capsule 1  . ticagrelor (BRILINTA) 90 MG TABS tablet Take 1 tablet (90 mg total) by mouth 2 (two) times daily. 180 tablet 3   No current facility-administered medications for this visit.     No Known Allergies  Social History    Social History  . Marital status: Widowed    Spouse name: N/A  . Number of children: 2  . Years of education: 6th   Occupational History  . Retired    Social History Main Topics  . Smoking status: Former Smoker    Packs/day: 1.00    Years: 20.00    Types: Cigarettes    Quit date: 10/23/2011  . Smokeless tobacco: Never Used  . Alcohol use Yes     Comment: occ  . Drug use: No  . Sexual activity: Not on file   Other Topics Concern  . Not on file   Social History Narrative   Past Medical History:      EST approximately 2002         Family History:      Sister had CABG in her 40s       Social History:      Lives with his wife Dale [name].  He is retired form working for city.  Does yard work with tractor and rides motorcycles. Has 2 children.  Sees his grandkids regularly.        Right-handed.   Occasional caffeine use.           Review of Systems: General: negative for chills, fever, night sweats or weight changes.  Cardiovascular: negative for chest pain, dyspnea on exertion, edema, orthopnea, palpitations, paroxysmal nocturnal dyspnea or shortness of breath Dermatological: negative for rash  Respiratory: negative for cough or wheezing Urologic: negative for hematuria Abdominal: negative for nausea, vomiting, diarrhea, bright red blood per rectum, melena, or hematemesis Neurologic: negative for visual changes, syncope, or dizziness All other systems reviewed and are otherwise negative except as noted above.    Blood pressure 109/65, pulse 60, height '5\' 5"'  (1.651 m), weight 142 lb (64.4 kg).  General appearance: alert and no distress Neck: no adenopathy, no carotid bruit, no JVD, supple, symmetrical, trachea midline and thyroid not enlarged, symmetric, no tenderness/mass/nodules Lungs: clear to auscultation bilaterally Heart: regular rate and rhythm, S1, S2 normal, no murmur, click, rub or gallop Extremities: extremities normal, atraumatic, no cyanosis or  edema  EKG performed today  ASSESSMENT AND PLAN:   HLD (hyperlipidemia) History of hyperlipidemia on pravastatin with recent lipid profile performed 12/31/16 revealed total cholesterol 119, LDL 51 and HDL of 60.  NSTEMI (non-ST elevated myocardial infarction) ([place]) History of non-STEMI status post recent cardiac catheterization performed by myself 12/30/16 revealing occluded AV groove circumflex status post stenting with a Medtronic Onyx drug-eluting stent. This 2-D echo was normal. He is on dual antiplatelet therapy which she takes reliably. He denies chest pain or shortness of breath.      Lorretta Harp MD FACP,FACC,FAHA, [facility] 02/07/2017 11:06 AM

## 2017-02-21 ENCOUNTER — Ambulatory Visit: Payer: Commercial Managed Care - HMO | Admitting: Neurology

## 2017-02-21 ENCOUNTER — Telehealth (HOSPITAL_COMMUNITY): Payer: Self-pay | Admitting: Family Medicine

## 2017-02-21 NOTE — Telephone Encounter (Signed)
Patient has [employer], insurance benefits verified. Patient has $10.00 co-payment, no deductible, out of pocket $5900/$700 has been met, 0% co-insurance, no pre-authorization and no limit on visit. Passport/reference [phone].

## 2017-02-23 ENCOUNTER — Other Ambulatory Visit: Payer: Self-pay | Admitting: Family Medicine

## 2017-02-23 DIAGNOSIS — R350 Frequency of micturition: Secondary | ICD-10-CM

## 2017-02-23 DIAGNOSIS — N400 Enlarged prostate without lower urinary tract symptoms: Secondary | ICD-10-CM

## 2017-02-28 ENCOUNTER — Encounter (HOSPITAL_COMMUNITY)
Admission: RE | Admit: 2017-02-28 | Discharge: 2017-02-28 | Disposition: A | Discharge status: Home / Self Care | Payer: Medicare HMO | Source: Ambulatory Visit | Attending: Cardiovascular Disease | Admitting: Cardiovascular Disease

## 2017-02-28 VITALS — BP 122/68 | HR 52 | Ht 64.25 in | Wt 148.1 lb

## 2017-02-28 DIAGNOSIS — Z955 Presence of coronary angioplasty implant and graft: Secondary | ICD-10-CM

## 2017-02-28 DIAGNOSIS — I214 Non-ST elevation (NSTEMI) myocardial infarction: Secondary | ICD-10-CM

## 2017-02-28 NOTE — Progress Notes (Signed)
Cardiac Individual Treatment Plan  Patient Details  Name: Dale [name] MRN: [id number] Date of Birth: 03/24/1941 Referring Provider:     CARDIAC REHAB PHASE II ORIENTATION from 02/28/2017 in [facility] CARDIAC [facility]  Referring Provider  Andree Coss MD      Initial Encounter Date:    CARDIAC REHAB PHASE II ORIENTATION from 02/28/2017 in [facility] CARDIAC REHAB  Date  02/28/17  Referring Provider  Andree Coss MD      Visit Diagnosis: 12/30/16 NSTEMI (non-ST elevated myocardial infarction) (HCC)  12/30/16 Status post coronary artery stent placement  Patient's Home Medications on Admission:  Current Outpatient Prescriptions:  .  aspirin 81 MG chewable tablet, Chew 1 tablet (81 mg total) by mouth daily., Disp: 30 tablet, Rfl: 3 .  donepezil (ARICEPT) 10 MG tablet, Take 1 tablet (10 mg total) by mouth at bedtime., Disp: 30 tablet, Rfl: 11 .  memantine (NAMENDA) 10 MG tablet, Take 1 tablet (10 mg total) by mouth 2 (two) times daily., Disp: 60 tablet, Rfl: 11 .  pantoprazole (PROTONIX) 40 MG tablet, Take 1 tablet (40 mg total) by mouth daily., Disp: 30 tablet, Rfl: 11 .  pravastatin (PRAVACHOL) 40 MG tablet, TAKE 1 TABLET(40 MG) BY MOUTH DAILY, Disp: 90 tablet, Rfl: 5 .  tamsulosin (FLOMAX) 0.4 MG CAPS capsule, TAKE 1 CAPSULE(0.4 MG) BY MOUTH DAILY AFTER SUPPER, Disp: 90 capsule, Rfl: 2 .  ticagrelor (BRILINTA) 90 MG TABS tablet, Take 1 tablet (90 mg total) by mouth 2 (two) times daily., Disp: 180 tablet, Rfl: 3 .  nitroGLYCERIN (NITROSTAT) 0.4 MG SL tablet, Place 1 tablet (0.4 mg total) under the tongue every 5 (five) minutes x 3 doses as needed for chest pain. (Patient not taking: Reported on 02/28/2017), Disp: 25 tablet, Rfl: 12  Past Medical History: Past Medical History:  Diagnosis Date  . Coronary artery disease   . Hyperlipemia   . Leg mass    rt  . Memory loss   . Myocardial infarction less than 4 weeks ago 12/29/2016  . Prostate  disease   . S/P angioplasty with stent to LCX     Tobacco Use: History  Smoking Status  . Former Smoker  . Packs/day: 1.00  . Years: 20.00  . Types: Cigarettes  . Quit date: 10/23/2011  Smokeless Tobacco  . Never Used    Labs: Recent Review Flowsheet Data    Labs for ITP Cardiac and Pulmonary Rehab Latest Ref Rng & Units 11/20/2008 05/27/2013 08/07/2014 12/31/2016   Cholestrol 0 - 200 mg/dL [phone]   LDLCALC 0 - 99 mg/dL 962(X) 528(U) 132(G) 51   HDL >40 mg/dL 60 62 65 60   Trlycerides <150 mg/dL 76 59 43 41   Hemoglobin A1c <5.7 % - - 5.3 -      Capillary Blood Glucose: No results found for: GLUCAP   Exercise Target Goals: Date: 02/28/17  Exercise Program Goal: Individual exercise prescription set with THRR, safety & activity barriers. Participant demonstrates ability to understand and report RPE using BORG scale, to self-measure pulse accurately, and to acknowledge the importance of the exercise prescription.  Exercise Prescription Goal: Starting with aerobic activity 30 plus minutes a day, 3 days per week for initial exercise prescription. Provide home exercise prescription and guidelines that participant acknowledges understanding prior to discharge.  Activity Barriers & Risk Stratification:     Activity Barriers & Cardiac Risk Stratification - 02/28/17 0859      Activity Barriers & Cardiac  Risk Stratification   Activity Barriers Deconditioning;Muscular Weakness;Other (comment)   Comments L shoulder pain   Cardiac Risk Stratification High      6 Minute Walk:     6 Minute Walk    Row Name 02/28/17 1346         6 Minute Walk   Phase Initial     Distance 2133 feet     Walk Time 6 minutes     # of Rest Breaks 0     MPH 4.04     METS 4.53     RPE 12     VO2 Peak 15.86     Symptoms Yes (comment)     Comments leg fatigue at end of walk test     Resting HR 52 bpm     Resting BP 122/68     Max Ex. HR 109 bpm     Max Ex. BP 154/78     2 Minute  Post BP 114/57        Oxygen Initial Assessment:   Oxygen Re-Evaluation:   Oxygen Discharge (Final Oxygen Re-Evaluation):   Initial Exercise Prescription:     Initial Exercise Prescription - 02/28/17 1400      Date of Initial Exercise RX and Referring Provider   Date 02/28/17   Referring Provider Andree Coss MD     Bike   Level 0.7   Minutes 10   METs 2.89     NuStep   Level 3   SPM 80   Minutes 10   METs 2.4     Track   Laps 13   Minutes 10   METs 3.26     Prescription Details   Frequency (times per week) 3   Duration Progress to 30 minutes of continuous aerobic without signs/symptoms of physical distress     Intensity   THRR 40-80% of Max Heartrate 59-118   Ratings of Perceived Exertion 11-13   Perceived Dyspnea 0-4     Progression   Progression Continue progressive overload as per policy without signs/symptoms or physical distress.     Resistance Training   Training Prescription Yes   Weight 3lbs   Reps 10-15      Perform Capillary Blood Glucose checks as needed.  Exercise Prescription Changes:   Exercise Comments:   Exercise Goals and Review:      Exercise Goals    Row Name 02/28/17 0900             Exercise Goals   Increase Physical Activity Yes       Intervention Provide advice, education, support and counseling about physical activity/exercise needs.;Develop an individualized exercise prescription for aerobic and resistive training based on initial evaluation findings, risk stratification, comorbidities and participant's personal goals.       Expected Outcomes Achievement of increased cardiorespiratory fitness and enhanced flexibility, muscular endurance and strength shown through measurements of functional capacity and personal statement of participant.       Increase Strength and Stamina Yes       Intervention Provide advice, education, support and counseling about physical activity/exercise needs.;Develop an individualized  exercise prescription for aerobic and resistive training based on initial evaluation findings, risk stratification, comorbidities and participant's personal goals.       Expected Outcomes Achievement of increased cardiorespiratory fitness and enhanced flexibility, muscular endurance and strength shown through measurements of functional capacity and personal statement of participant.          Exercise Goals Re-Evaluation :  Discharge Exercise Prescription (Final Exercise Prescription Changes):   Nutrition:  Target Goals: Understanding of nutrition guidelines, daily intake of sodium 1500mg , cholesterol 200mg , calories 30% from fat and 7% or less from saturated fats, daily to have 5 or more servings of fruits and vegetables.  Biometrics:     Pre Biometrics - 02/28/17 1349      Pre Biometrics   Waist Circumference 35 inches   Hip Circumference 39 inches   Waist to Hip Ratio 0.9 %   Triceps Skinfold 15 mm   % Body Fat 24.8 %   Grip Strength 34 kg   Flexibility 12 in   Single Leg Stand 4 seconds       Nutrition Therapy Plan and Nutrition Goals:   Nutrition Discharge: Nutrition Scores:   Nutrition Goals Re-Evaluation:   Nutrition Goals Re-Evaluation:   Nutrition Goals Discharge (Final Nutrition Goals Re-Evaluation):   Psychosocial: Target Goals: Acknowledge presence or absence of significant depression and/or stress, maximize coping skills, provide positive support system. Participant is able to verbalize types and ability to use techniques and skills needed for reducing stress and depression.  Initial Review & Psychosocial Screening:     Initial Psych Review & Screening - 02/28/17 1615      Initial Review   Current issues with None Identified     Family Dynamics   Good Support System? Yes     Barriers   Psychosocial barriers to participate in program There are no identifiable barriers or psychosocial needs.     Screening Interventions   Interventions  Encouraged to exercise      Quality of Life Scores:   PHQ-9: Recent Review Flowsheet Data    Depression screen [facility] 2/9 12/27/2016 10/12/2016 09/14/2016 07/27/2016 02/03/2016   Decreased Interest 0 0 0 0 0   Down, Depressed, Hopeless 0 0 0 0 0   PHQ - 2 Score 0 0 0 0 0     Interpretation of Total Score  Total Score Depression Severity:  1-4 = Minimal depression, 5-9 = Mild depression, 10-14 = Moderate depression, 15-19 = Moderately severe depression, 20-27 = Severe depression   Psychosocial Evaluation and Intervention:   Psychosocial Re-Evaluation:   Psychosocial Discharge (Final Psychosocial Re-Evaluation):   Vocational Rehabilitation: Provide vocational rehab assistance to qualifying candidates.   Vocational Rehab Evaluation & Intervention:     Vocational Rehab - 02/28/17 1615      Initial Vocational Rehab Evaluation & Intervention   Assessment shows need for Vocational Rehabilitation No      Education: Education Goals: Education classes will be provided on a weekly basis, covering required topics. Participant will state understanding/return demonstration of topics presented.  Learning Barriers/Preferences:     Learning Barriers/Preferences - 02/28/17 4098      Learning Barriers/Preferences   Learning Barriers Exercise Concerns  memory deficit      Education Topics: Count Your Pulse:  -Group instruction provided by verbal instruction, demonstration, patient participation and written materials to support subject.  Instructors address importance of being able to find your pulse and how to count your pulse when at home without a heart monitor.  Patients get hands on experience counting their pulse with staff help and individually.   Heart Attack, Angina, and Risk Factor Modification:  -Group instruction provided by verbal instruction, video, and written materials to support subject.  Instructors address signs and symptoms of angina and heart attacks.    Also  discuss risk factors for heart disease and how to make changes to improve heart  health risk factors.   Functional Fitness:  -Group instruction provided by verbal instruction, demonstration, patient participation, and written materials to support subject.  Instructors address safety measures for doing things around the house.  Discuss how to get up and down off the floor, how to pick things up properly, how to safely get out of a chair without assistance, and balance training.   Meditation and Mindfulness:  -Group instruction provided by verbal instruction, patient participation, and written materials to support subject.  Instructor addresses importance of mindfulness and meditation practice to help reduce stress and improve awareness.  Instructor also leads participants through a meditation exercise.    Stretching for Flexibility and Mobility:  -Group instruction provided by verbal instruction, patient participation, and written materials to support subject.  Instructors lead participants through series of stretches that are designed to increase flexibility thus improving mobility.  These stretches are additional exercise for major muscle groups that are typically performed during regular warm up and cool down.   Hands Only CPR Anytime:  -Group instruction provided by verbal instruction, video, patient participation and written materials to support subject.  Instructors co-teach with AHA video for hands only CPR.  Participants get hands on experience with mannequins.   Nutrition I class: Heart Healthy Eating:  -Group instruction provided by PowerPoint slides, verbal discussion, and written materials to support subject matter. The instructor gives an explanation and review of the Therapeutic Lifestyle Changes diet recommendations, which includes a discussion on lipid goals, dietary fat, sodium, fiber, plant stanol/sterol esters, sugar, and the components of a well-balanced, healthy  diet.   Nutrition II class: Lifestyle Skills:  -Group instruction provided by PowerPoint slides, verbal discussion, and written materials to support subject matter. The instructor gives an explanation and review of label reading, grocery shopping for heart health, heart healthy recipe modifications, and ways to make healthier choices when eating out.   Diabetes Question & Answer:  -Group instruction provided by PowerPoint slides, verbal discussion, and written materials to support subject matter. The instructor gives an explanation and review of diabetes co-morbidities, pre- and post-prandial blood glucose goals, pre-exercise blood glucose goals, signs, symptoms, and treatment of hypoglycemia and hyperglycemia, and foot care basics.   Diabetes Blitz:  -Group instruction provided by PowerPoint slides, verbal discussion, and written materials to support subject matter. The instructor gives an explanation and review of the physiology behind type 1 and type 2 diabetes, diabetes medications and rational behind using different medications, pre- and post-prandial blood glucose recommendations and Hemoglobin A1c goals, diabetes diet, and exercise including blood glucose guidelines for exercising safely.    Portion Distortion:  -Group instruction provided by PowerPoint slides, verbal discussion, written materials, and food models to support subject matter. The instructor gives an explanation of serving size versus portion size, changes in portions sizes over the last 20 years, and what consists of a serving from each food group.   Stress Management:  -Group instruction provided by verbal instruction, video, and written materials to support subject matter.  Instructors review role of stress in heart disease and how to cope with stress positively.     Exercising on Your Own:  -Group instruction provided by verbal instruction, power point, and written materials to support subject.  Instructors discuss  benefits of exercise, components of exercise, frequency and intensity of exercise, and end points for exercise.  Also discuss use of nitroglycerin and activating EMS.  Review options of places to exercise outside of rehab.  Review guidelines for sex with heart  disease.   Cardiac Drugs I:  -Group instruction provided by verbal instruction and written materials to support subject.  Instructor reviews cardiac drug classes: antiplatelets, anticoagulants, beta blockers, and statins.  Instructor discusses reasons, side effects, and lifestyle considerations for each drug class.   Cardiac Drugs II:  -Group instruction provided by verbal instruction and written materials to support subject.  Instructor reviews cardiac drug classes: angiotensin converting enzyme inhibitors (ACE-I), angiotensin II receptor blockers (ARBs), nitrates, and calcium channel blockers.  Instructor discusses reasons, side effects, and lifestyle considerations for each drug class.   Anatomy and Physiology of the Circulatory System:  -Group instruction provided by verbal instruction, video, and written materials to support subject.  Reviews functional anatomy of heart, how it relates to various diagnoses, and what role the heart plays in the overall system.   Knowledge Questionnaire Score:   Core Components/Risk Factors/Patient Goals at Admission:     Personal Goals and Risk Factors at Admission - 02/28/17 0901      Core Components/Risk Factors/Patient Goals on Admission   Lipids Yes   Intervention Provide education and support for participant on nutrition & aerobic/resistive exercise along with prescribed medications to achieve LDL 70mg , HDL >40mg .   Expected Outcomes Short Term: Participant states understanding of desired cholesterol values and is compliant with medications prescribed. Participant is following exercise prescription and nutrition guidelines.;Long Term: Cholesterol controlled with medications as prescribed,  with individualized exercise RX and with personalized nutrition plan. Value goals: LDL < 70mg , HDL > 40 mg.      Core Components/Risk Factors/Patient Goals Review:    Core Components/Risk Factors/Patient Goals at Discharge (Final Review):    ITP Comments:     ITP Comments    Row Name 02/28/17 0856           ITP Comments Dr. Armanda Magic, Medical Director          Comments: Mr Kloth attended orientation from 0800 to 1000 to review rules and guidelines for program. Completed 6 minute walk test, Intitial ITP, and exercise prescription.  VSS. Telemetry-Sinus Rhythm with a first degree heart block and an inverted t wave .  Asymptomatic.Gladstone Lighter, RN,BSN 02/28/2017 4:27 PM

## 2017-02-28 NOTE — Progress Notes (Signed)
Cardiac Rehab Medication Review by a Pharmacist  Does the patient  feel that his/her medications are working for him/her?  yes  Has the patient been experiencing any side effects to the medications prescribed?  no  Does the patient measure his/her own blood pressure or blood glucose at home?  no - patient reports no symptoms of high or low BP   Does the patient have any problems obtaining medications due to transportation or finances?   no  Understanding of regimen: poor Understanding of indications: poor Potential of compliance: fair   Pharmacist comments: Mr. Dale [name] is a pleasant AA male presenting to cardiac rehab with his son today. He is unsure of which medications he takes as his daughter fills his pill box for him. His son was able to provide some information. Mr. Dale [name] showed me his morning medications, which he had in a paper towel in his shirt pocket. He states that he didn't take them this morning yet because it wasn't time yet. He is under the impression that he has to take his medications 12 hours apart exactly - his son and I reassured him that it's okay to be a bit flexible with this so he doesn't have to put his pills in his pocket. When he does this, his son states that he'll throw his medications away if he misses the exact time. Otherwise, Mr. Dale [name] and his son had no other questions or concerns with his medication regimen.    Allie Bossier, PharmD PGY1 Pharmacy Resident [phone] (Pager) 02/28/2017 8:43 AM

## 2017-03-06 ENCOUNTER — Ambulatory Visit (INDEPENDENT_AMBULATORY_CARE_PROVIDER_SITE_OTHER): Payer: Medicare HMO | Admitting: Neurology

## 2017-03-06 ENCOUNTER — Encounter (HOSPITAL_COMMUNITY)
Admission: RE | Admit: 2017-03-06 | Discharge: 2017-03-06 | Disposition: A | Discharge status: Home / Self Care | Payer: Medicare HMO | Source: Ambulatory Visit | Attending: Cardiovascular Disease | Admitting: Cardiovascular Disease

## 2017-03-06 ENCOUNTER — Encounter (HOSPITAL_COMMUNITY): Payer: Self-pay

## 2017-03-06 ENCOUNTER — Encounter: Payer: Self-pay | Admitting: Neurology

## 2017-03-06 VITALS — BP 113/64 | HR 58 | Ht 64.25 in | Wt 147.0 lb

## 2017-03-06 DIAGNOSIS — Z955 Presence of coronary angioplasty implant and graft: Secondary | ICD-10-CM

## 2017-03-06 DIAGNOSIS — I214 Non-ST elevation (NSTEMI) myocardial infarction: Secondary | ICD-10-CM | POA: Diagnosis not present

## 2017-03-06 DIAGNOSIS — G3184 Mild cognitive impairment, so stated: Secondary | ICD-10-CM

## 2017-03-06 MED ORDER — DONEPEZIL HCL 10 MG PO TABS: 10.0000 mg | ORAL_TABLET | Freq: Every day | ORAL | 4 refills | Status: DC

## 2017-03-06 MED ORDER — MEMANTINE HCL 10 MG PO TABS: 10.0000 mg | ORAL_TABLET | Freq: Two times a day (BID) | ORAL | 4 refills | Status: DC

## 2017-03-06 NOTE — Progress Notes (Signed)
PATIENT: Dale [name] DOB: 01-21-1941  Chief Complaint  Patient presents with  . Alzheimer's Disease    MMSE 24/30 - 11 animals.  He is here with his daughter, Dale [name]. His memory is mildly worse.     HISTORICAL  Dale [name] is a 40 right-handed male, accompanied by his daughter Dale [name], seen in refer by his primary care doctor Dale [name] for evaluation of memory loss, initial evaluation was on December 12th 2017.  I reviewed and summarized the referring note, he had a history of hyperlipidemia, he graduate from 6th grade, he lives by himself since 2007, he still drives, does lawn care. His father had Alzheimer's disease at age 29s.   He was noted to have memory loss since 2015, loosing his keys, forget to turn his car key off, he made mistake paying bills, he denies gait abnormality, has good appetite, sleeps well.   I reviewed laboratory evaluations, normal folic acid B12, CBC, TSH, CMP  UPDATE March 06 2017: He is with his daughter at visit, he still lives by himself, able to does lawn care, he feels anxious sometime, difficulty with multitasking, his Mini-Mental Status Examination has decreased from previous 48 in December 2017 2 current 25  he was admitted to hospital in January 2018 for now non-SETMI, echocardiogram showed ejection fraction 55-60%, wall motion was normal. Angiogram showed circumferential 100% stenosis, he had a stent placement. He is now on aspirin 81 mg and Brilinta 90 mg twice a day  I reviewed laboratory evaluation in August 2017, vitamin B12 544, folic acid 14 point 7, TSH 8.89  We have personally reviewed MRI of the brain in January 2018, mild generalized atrophy, supratentorium small vessel disease.  REVIEW OF SYSTEMS: Full 14 system review of systems performed and notable only for memory loss, confusion  ALLERGIES: No Known Allergies  HOME MEDICATIONS: Current Outpatient Prescriptions  Medication Sig Dispense Refill  . aspirin 81 MG  chewable tablet Chew 1 tablet (81 mg total) by mouth daily. 30 tablet 3  . donepezil (ARICEPT) 10 MG tablet Take 1 tablet (10 mg total) by mouth at bedtime. 30 tablet 11  . memantine (NAMENDA) 10 MG tablet Take 1 tablet (10 mg total) by mouth 2 (two) times daily. 60 tablet 11  . nitroGLYCERIN (NITROSTAT) 0.4 MG SL tablet Place 1 tablet (0.4 mg total) under the tongue every 5 (five) minutes x 3 doses as needed for chest pain. 25 tablet 12  . pantoprazole (PROTONIX) 40 MG tablet Take 1 tablet (40 mg total) by mouth daily. 30 tablet 11  . pravastatin (PRAVACHOL) 40 MG tablet TAKE 1 TABLET(40 MG) BY MOUTH DAILY 90 tablet 5  . tamsulosin (FLOMAX) 0.4 MG CAPS capsule TAKE 1 CAPSULE(0.4 MG) BY MOUTH DAILY AFTER SUPPER 90 capsule 2  . ticagrelor (BRILINTA) 90 MG TABS tablet Take 1 tablet (90 mg total) by mouth 2 (two) times daily. 180 tablet 3   No current facility-administered medications for this visit.     PAST MEDICAL HISTORY: Past Medical History:  Diagnosis Date  . Coronary artery disease   . Hyperlipemia   . Leg mass    rt  . Memory loss   . Myocardial infarction less than 4 weeks ago 12/29/2016  . Prostate disease   . S/P angioplasty with stent to LCX     PAST SURGICAL HISTORY: Past Surgical History:  Procedure Laterality Date  . CARDIAC CATHETERIZATION N/A 12/30/2016   Procedure: Coronary Stent Intervention;  Surgeon: Delton See  Allyson Sabal, MD;  Location: [facility];  Service: Cardiovascular;  Laterality: N/A;  . CARDIAC CATHETERIZATION N/A 12/30/2016   Procedure: Coronary Angiography;  Surgeon: Runell Gess, MD;  Location: [facility];  Service: Cardiovascular;  Laterality: N/A;  . CARDIAC CATHETERIZATION N/A 12/30/2016   Procedure: Coronary Balloon Angioplasty;  Surgeon: Runell Gess, MD;  Location: [facility];  Service: Cardiovascular;  Laterality: N/A;    FAMILY HISTORY: Family History  Problem Relation Age of Onset  . Cancer Mother     Dale [name] of  type  . Heart attack Father   . Alzheimer's disease Father     SOCIAL HISTORY:  Social History   Social History  . Marital status: Widowed    Spouse name: N/A  . Number of children: 2  . Years of education: 6th   Occupational History  . Retired    Social History Main Topics  . Smoking status: Former Smoker    Packs/day: 1.00    Years: 20.00    Types: Cigarettes    Quit date: 10/23/2011  . Smokeless tobacco: Never Used  . Alcohol use Yes     Comment: occ  . Drug use: No  . Sexual activity: Not on file   Other Topics Concern  . Not on file   Social History Narrative   Past Medical History:      EST approximately 2002         Family History:      Sister had CABG in her 35s       Social History:      Lives with his wife Dale [name].  He is retired form working for city.  Does yard work with tractor and rides motorcycles. Has 2 children.  Sees his grandkids regularly.        Right-handed.   Occasional caffeine use.           PHYSICAL EXAM   Vitals:   03/06/17 1048  BP: 113/64  Pulse: (!) 58  Weight: 147 lb (66.7 kg)  Height: 5' 4.25" (1.632 m)    Not recorded      Body mass index is 25.04 kg/m.  PHYSICAL EXAMNIATION:  Gen: NAD, conversant, well nourised, obese, well groomed                     Cardiovascular: Regular rate rhythm, no peripheral edema, warm, nontender. Eyes: Conjunctivae clear without exudates or hemorrhage Neck: Supple, no carotid bruits. Pulmonary: Clear to auscultation bilaterally   NEUROLOGICAL EXAM:  MENTAL STATUS: Speech:    Speech is normal; fluent and spontaneous with normal comprehension.  Cognition:Mini-Mental Status Examination 25/30, animal naming 5     Orientation:Not oriented to date, county     recent and remote memory: He missed the 1/3 recalls     Normal Attention span and concentration     Normal Language, naming, repeating,spontaneous speech, has difficulty copy design     Fund of knowledge   CRANIAL  NERVES: CN II: Visual fields are full to confrontation. Fundoscopic exam is normal with sharp discs and no vascular changes. Pupils are round equal and briskly reactive to light. CN III, IV, VI: extraocular movement are normal. No ptosis. CN V: Facial sensation is intact to pinprick in all 3 divisions bilaterally. Corneal responses are intact.  CN VII: Face is symmetric with normal eye closure and smile. CN VIII: Hearing is normal to rubbing fingers CN IX, X: Palate elevates symmetrically. Phonation is normal.  CN XI: Head turning and shoulder shrug are intact CN XII: Tongue is midline with normal movements and no atrophy.  MOTOR: There is no pronator drift of out-stretched arms. Muscle bulk and tone are normal. Muscle strength is normal.  REFLEXES: Reflexes are 2+ and symmetric at the biceps, triceps, knees, and ankles. Plantar responses are flexor.  SENSORY: Intact to light touch, pinprick, positional sensation and vibratory sensation are intact in fingers and toes.  COORDINATION: Rapid alternating movements and fine finger movements are intact. There is no dysmetria on finger-to-nose and heel-knee-shin.    GAIT/STANCE: Posture is normal. Gait is steady with normal steps, base, arm swing, and turning. Heel and toe walking are normal. Tandem gait is normal.  Romberg is absent.   DIAGNOSTIC DATA (LABS, IMAGING, TESTING) - I reviewed patient records, labs, notes, testing and imaging myself where available.   ASSESSMENT AND PLAN  DRAY DENTE is a 76 y.o. male   Mild cognitive impairment, progressively worsening  Mini-Mental Status Examination 25/30  Family history of Alzheimer's disease  MRI of the brain, showed generalized atrophy  Started Namenda 10 mg twice a day, and Aricept 10 mg daily  He is interested in research study, information was provided  Return to clinic in 6 months   Levert Feinstein, M.D. Ph.D.  [facility] [address], [address] [place], [place] [address] Ph: [phone] Fax: [phone]  CC: Dale Living, MD

## 2017-03-06 NOTE — Progress Notes (Addendum)
Daily Session Note  Patient Details  Name: NICHAEL EHLY MRN: [id number] Date of Birth: May 14, 1941 Referring Provider:     CARDIAC REHAB PHASE II ORIENTATION from 02/28/2017 in [place]  Referring Provider  Ancil Linsey MD      Encounter Date: 03/06/2017  Check In:     Session Check In - 03/06/17 1419      Check-In   Location (P)  [facility]   Staff Present (P)  Cleda Mccreedy, MS, Exercise Physiologist;Carlette Wilber Oliphant, RN, BSN;Amber Fair, MS, ACSM RCEP, Exercise Physiologist;Joann Rion, RN, Designer, television/film set physician immediately available to respond to emergencies (P)  [facility] immediately available   Physician(s) (P)  Dr. Tana Coast   Medication changes reported     (P)  No   Fall or balance concerns reported    (P)  No   Tobacco Cessation (P)  No Change   Warm-up and Cool-down (P)  Performed as group-led instruction   Resistance Training Performed (P)  Yes   VAD Patient? (P)  No      Capillary Blood Glucose: No results found for this or any previous visit (from the past 24 hour(s)).    History  Smoking Status  . Former Smoker  . Packs/day: 1.00  . Years: 20.00  . Types: Cigarettes  . Quit date: 10/23/2011  Smokeless Tobacco  . Never Used    Goals Met:  Exercise tolerated well  Goals Unmet:  Not Applicable  Comments: Pt started cardiac rehab today.  Pt tolerated light exercise without difficulty. VSS, telemetry-sinus rhythm,  asymptomatic.  Medication list reconciled. Pt denies barriers to medicaiton compliance.  PSYCHOSOCIAL ASSESSMENT:  PHQ-0.  Pt does have memory impairment which is being treated by neuro.    Pt lives alone but his adult son and daughter help with his care.  Pt is having difficulty with this role reversal. Pt is widower.  Pt does have difficulty with memory recall.  Pt reports having chest pain this weekend, resolved with NTG SL x1.  Pt states the symptoms occurred when he was  angry and frustrated.  Pt demonstrates poor coping skills.    Pt enjoys riding his motorcycle, although he is no longer able to.   Pt goals are to increase strength and stamina.  Pt encouraged to participate in home exercise in addition to cardiac rehab activities.  Pt oriented to exercise equipment and routine.    Understanding verbalized.   Dr. Fransico Him is Medical Director for Cardiac Rehab at [facility].

## 2017-03-08 ENCOUNTER — Encounter (HOSPITAL_COMMUNITY)
Admission: RE | Admit: 2017-03-08 | Discharge: 2017-03-08 | Disposition: A | Discharge status: Home / Self Care | Payer: Medicare HMO | Source: Ambulatory Visit | Attending: Cardiovascular Disease | Admitting: Cardiovascular Disease

## 2017-03-08 DIAGNOSIS — Z955 Presence of coronary angioplasty implant and graft: Secondary | ICD-10-CM

## 2017-03-08 DIAGNOSIS — I214 Non-ST elevation (NSTEMI) myocardial infarction: Secondary | ICD-10-CM

## 2017-03-08 NOTE — Progress Notes (Signed)
Cardiac Individual Treatment Plan  Patient Details  Name: Dale [name] MRN: [id number] Date of Birth: 01/30/1941 Referring Provider:     CARDIAC REHAB PHASE II ORIENTATION from 02/28/2017 in [facility] CARDIAC [facility]  Referring Provider  Andree Coss MD      Initial Encounter Date:    CARDIAC REHAB PHASE II ORIENTATION from 02/28/2017 in [facility] CARDIAC REHAB  Date  02/28/17  Referring Provider  Andree Coss MD      Visit Diagnosis: No diagnosis found.  Patient's Home Medications on Admission:  Current Outpatient Prescriptions:  .  aspirin 81 MG chewable tablet, Chew 1 tablet (81 mg total) by mouth daily., Disp: 30 tablet, Rfl: 3 .  donepezil (ARICEPT) 10 MG tablet, Take 1 tablet (10 mg total) by mouth at bedtime., Disp: 90 tablet, Rfl: 4 .  memantine (NAMENDA) 10 MG tablet, Take 1 tablet (10 mg total) by mouth 2 (two) times daily., Disp: 180 tablet, Rfl: 4 .  nitroGLYCERIN (NITROSTAT) 0.4 MG SL tablet, Place 1 tablet (0.4 mg total) under the tongue every 5 (five) minutes x 3 doses as needed for chest pain., Disp: 25 tablet, Rfl: 12 .  pantoprazole (PROTONIX) 40 MG tablet, Take 1 tablet (40 mg total) by mouth daily., Disp: 30 tablet, Rfl: 11 .  pravastatin (PRAVACHOL) 40 MG tablet, TAKE 1 TABLET(40 MG) BY MOUTH DAILY, Disp: 90 tablet, Rfl: 5 .  tamsulosin (FLOMAX) 0.4 MG CAPS capsule, TAKE 1 CAPSULE(0.4 MG) BY MOUTH DAILY AFTER SUPPER, Disp: 90 capsule, Rfl: 2 .  ticagrelor (BRILINTA) 90 MG TABS tablet, Take 1 tablet (90 mg total) by mouth 2 (two) times daily., Disp: 180 tablet, Rfl: 3  Past Medical History: Past Medical History:  Diagnosis Date  . Coronary artery disease   . Hyperlipemia   . Leg mass    rt  . Memory loss   . Myocardial infarction less than 4 weeks ago 12/29/2016  . Prostate disease   . S/P angioplasty with stent to LCX     Tobacco Use: History  Smoking Status  . Former Smoker  . Packs/day: 1.00  . Years:  20.00  . Types: Cigarettes  . Quit date: 10/23/2011  Smokeless Tobacco  . Never Used    Labs: Recent Review Flowsheet Data    Labs for ITP Cardiac and Pulmonary Rehab Latest Ref Rng & Units 11/20/2008 05/27/2013 08/07/2014 12/31/2016   Cholestrol 0 - 200 mg/dL [phone]   LDLCALC 0 - 99 mg/dL 956(O) 130(Q) 657(Q) 51   HDL >40 mg/dL 60 62 65 60   Trlycerides <150 mg/dL 76 59 43 41   Hemoglobin A1c <5.7 % - - 5.3 -      Capillary Blood Glucose: No results found for: GLUCAP   Exercise Target Goals:    Exercise Program Goal: Individual exercise prescription set with THRR, safety & activity barriers. Participant demonstrates ability to understand and report RPE using BORG scale, to self-measure pulse accurately, and to acknowledge the importance of the exercise prescription.  Exercise Prescription Goal: Starting with aerobic activity 30 plus minutes a day, 3 days per week for initial exercise prescription. Provide home exercise prescription and guidelines that participant acknowledges understanding prior to discharge.  Activity Barriers & Risk Stratification:     Activity Barriers & Cardiac Risk Stratification - 02/28/17 0859      Activity Barriers & Cardiac Risk Stratification   Activity Barriers Deconditioning;Muscular Weakness;Other (comment)   Comments L shoulder pain   Cardiac  Risk Stratification High      6 Minute Walk:     6 Minute Walk    Row Name 02/28/17 1346         6 Minute Walk   Phase Initial     Distance 2133 feet     Walk Time 6 minutes     # of Rest Breaks 0     MPH 4.04     METS 4.53     RPE 12     VO2 Peak 15.86     Symptoms Yes (comment)     Comments leg fatigue at end of walk test     Resting HR 52 bpm     Resting BP 122/68     Max Ex. HR 109 bpm     Max Ex. BP 154/78     2 Minute Post BP 114/57        Oxygen Initial Assessment:   Oxygen Re-Evaluation:   Oxygen Discharge (Final Oxygen Re-Evaluation):   Initial Exercise  Prescription:     Initial Exercise Prescription - 02/28/17 1400      Date of Initial Exercise RX and Referring Provider   Date 02/28/17   Referring Provider Andree Coss MD     Bike   Level 0.7   Minutes 10   METs 2.89     NuStep   Level 3   SPM 80   Minutes 10   METs 2.4     Track   Laps 13   Minutes 10   METs 3.26     Prescription Details   Frequency (times per week) 3   Duration Progress to 30 minutes of continuous aerobic without signs/symptoms of physical distress     Intensity   THRR 40-80% of Max Heartrate 59-118   Ratings of Perceived Exertion 11-13   Perceived Dyspnea 0-4     Progression   Progression Continue progressive overload as per policy without signs/symptoms or physical distress.     Resistance Training   Training Prescription Yes   Weight 3lbs   Reps 10-15      Perform Capillary Blood Glucose checks as needed.  Exercise Prescription Changes:     Exercise Prescription Changes    Row Name 03/07/17 1600             Response to Exercise   Blood Pressure (Admit) 122/70       Blood Pressure (Exercise) 140/84       Blood Pressure (Exit) 102/60       Heart Rate (Admit) 59 bpm       Heart Rate (Exercise) 111 bpm       Heart Rate (Exit) 59 bpm       Rating of Perceived Exertion (Exercise) 11       Symptoms none       Duration Continue with 30 min of aerobic exercise without signs/symptoms of physical distress.       Intensity THRR unchanged         Resistance Training   Training Prescription Yes       Weight 2lbs       Reps 10-15       Time 10 Minutes         Bike   Level 0.7       Minutes 10       METs 15         NuStep   Level 3       SPM 80  Minutes 15       METs 1.7          Exercise Comments:     Exercise Comments    Row Name 03/07/17 1624           Exercise Comments Pt completed first day of exercise. Pt was oriented to exercise equipment and handled exercise very well; will continue to monitor pt's  progress with exercise.          Exercise Goals and Review:     Exercise Goals    Row Name 02/28/17 0900             Exercise Goals   Increase Physical Activity Yes       Intervention Provide advice, education, support and counseling about physical activity/exercise needs.;Develop an individualized exercise prescription for aerobic and resistive training based on initial evaluation findings, risk stratification, comorbidities and participant's personal goals.       Expected Outcomes Achievement of increased cardiorespiratory fitness and enhanced flexibility, muscular endurance and strength shown through measurements of functional capacity and personal statement of participant.       Increase Strength and Stamina Yes       Intervention Provide advice, education, support and counseling about physical activity/exercise needs.;Develop an individualized exercise prescription for aerobic and resistive training based on initial evaluation findings, risk stratification, comorbidities and participant's personal goals.       Expected Outcomes Achievement of increased cardiorespiratory fitness and enhanced flexibility, muscular endurance and strength shown through measurements of functional capacity and personal statement of participant.          Exercise Goals Re-Evaluation :    Discharge Exercise Prescription (Final Exercise Prescription Changes):     Exercise Prescription Changes - 03/07/17 1600      Response to Exercise   Blood Pressure (Admit) 122/70   Blood Pressure (Exercise) 140/84   Blood Pressure (Exit) 102/60   Heart Rate (Admit) 59 bpm   Heart Rate (Exercise) 111 bpm   Heart Rate (Exit) 59 bpm   Rating of Perceived Exertion (Exercise) 11   Symptoms none   Duration Continue with 30 min of aerobic exercise without signs/symptoms of physical distress.   Intensity THRR unchanged     Resistance Training   Training Prescription Yes   Weight 2lbs   Reps 10-15   Time 10  Minutes     Bike   Level 0.7   Minutes 10   METs 15     NuStep   Level 3   SPM 80   Minutes 15   METs 1.7      Nutrition:  Target Goals: Understanding of nutrition guidelines, daily intake of sodium 1500mg , cholesterol 200mg , calories 30% from fat and 7% or less from saturated fats, daily to have 5 or more servings of fruits and vegetables.  Biometrics:     Pre Biometrics - 02/28/17 1349      Pre Biometrics   Waist Circumference 35 inches   Hip Circumference 39 inches   Waist to Hip Ratio 0.9 %   Triceps Skinfold 15 mm   % Body Fat 24.8 %   Grip Strength 34 kg   Flexibility 12 in   Single Leg Stand 4 seconds       Nutrition Therapy Plan and Nutrition Goals:   Nutrition Discharge: Nutrition Scores:   Nutrition Goals Re-Evaluation:   Nutrition Goals Re-Evaluation:   Nutrition Goals Discharge (Final Nutrition Goals Re-Evaluation):   Psychosocial: Target Goals: Acknowledge presence or absence of  significant depression and/or stress, maximize coping skills, provide positive support system. Participant is able to verbalize types and ability to use techniques and skills needed for reducing stress and depression.  Initial Review & Psychosocial Screening:     Initial Psych Review & Screening - 02/28/17 1615      Initial Review   Current issues with None Identified     Family Dynamics   Good Support System? Yes     Barriers   Psychosocial barriers to participate in program There are no identifiable barriers or psychosocial needs.     Screening Interventions   Interventions Encouraged to exercise      Quality of Life Scores:   PHQ-9: Recent Review Flowsheet Data    Depression screen [facility] 2/9 03/06/2017 12/27/2016 10/12/2016 09/14/2016 07/27/2016   Decreased Interest 0 0 0 0 0   Down, Depressed, Hopeless 0 0 0 0 0   PHQ - 2 Score 0 0 0 0 0     Interpretation of Total Score  Total Score Depression Severity:  1-4 = Minimal depression, 5-9 = Mild  depression, 10-14 = Moderate depression, 15-19 = Moderately severe depression, 20-27 = Severe depression   Psychosocial Evaluation and Intervention:     Psychosocial Evaluation - 03/06/17 1612      Psychosocial Evaluation & Interventions   Interventions Stress management education;Relaxation education;Encouraged to exercise with the program and follow exercise prescription   Comments pt with memory deficit treated by neuro.   pt son and daughter assist patient although he lives alone. pt does admit to anger associated with the role reversals.     Expected Outcomes pt will demonstrate positive outlook with good coping skills.     Continue Psychosocial Services  Follow up required by staff      Psychosocial Re-Evaluation:     Psychosocial Re-Evaluation    Row Name 03/08/17 1650             Psychosocial Re-Evaluation   Current issues with Current Stress Concerns;Current Anxiety/Panic       Comments current dementia        Expected Outcomes pt will exhibit good coping skills with positive outlook.         Interventions Encouraged to attend Cardiac Rehabilitation for the exercise;Relaxation education;Stress management education       Continue Psychosocial Services  Follow up required by staff       Comments pt son and daughter are very involved in pt care and well being, although pt does live alone.          Initial Review   Source of Stress Concerns Chronic Illness;Unable to perform yard/household activities;Unable to participate in former interests or hobbies          Psychosocial Discharge (Final Psychosocial Re-Evaluation):     Psychosocial Re-Evaluation - 03/08/17 1650      Psychosocial Re-Evaluation   Current issues with Current Stress Concerns;Current Anxiety/Panic   Comments current dementia    Expected Outcomes pt will exhibit good coping skills with positive outlook.     Interventions Encouraged to attend Cardiac Rehabilitation for the exercise;Relaxation  education;Stress management education   Continue Psychosocial Services  Follow up required by staff   Comments pt son and daughter are very involved in pt care and well being, although pt does live alone.      Initial Review   Source of Stress Concerns Chronic Illness;Unable to perform yard/household activities;Unable to participate in former interests or hobbies      Vocational  Rehabilitation: Provide vocational rehab assistance to qualifying candidates.   Vocational Rehab Evaluation & Intervention:     Vocational Rehab - 02/28/17 1615      Initial Vocational Rehab Evaluation & Intervention   Assessment shows need for Vocational Rehabilitation No      Education: Education Goals: Education classes will be provided on a weekly basis, covering required topics. Participant will state understanding/return demonstration of topics presented.  Learning Barriers/Preferences:     Learning Barriers/Preferences - 02/28/17 2956      Learning Barriers/Preferences   Learning Barriers Exercise Concerns  memory deficit      Education Topics: Count Your Pulse:  -Group instruction provided by verbal instruction, demonstration, patient participation and written materials to support subject.  Instructors address importance of being able to find your pulse and how to count your pulse when at home without a heart monitor.  Patients get hands on experience counting their pulse with staff help and individually.   Heart Attack, Angina, and Risk Factor Modification:  -Group instruction provided by verbal instruction, video, and written materials to support subject.  Instructors address signs and symptoms of angina and heart attacks.    Also discuss risk factors for heart disease and how to make changes to improve heart health risk factors.   Functional Fitness:  -Group instruction provided by verbal instruction, demonstration, patient participation, and written materials to support subject.   Instructors address safety measures for doing things around the house.  Discuss how to get up and down off the floor, how to pick things up properly, how to safely get out of a chair without assistance, and balance training.   Meditation and Mindfulness:  -Group instruction provided by verbal instruction, patient participation, and written materials to support subject.  Instructor addresses importance of mindfulness and meditation practice to help reduce stress and improve awareness.  Instructor also leads participants through a meditation exercise.    Stretching for Flexibility and Mobility:  -Group instruction provided by verbal instruction, patient participation, and written materials to support subject.  Instructors lead participants through series of stretches that are designed to increase flexibility thus improving mobility.  These stretches are additional exercise for major muscle groups that are typically performed during regular warm up and cool down.   Hands Only CPR Anytime:  -Group instruction provided by verbal instruction, video, patient participation and written materials to support subject.  Instructors co-teach with AHA video for hands only CPR.  Participants get hands on experience with mannequins.   Nutrition I class: Heart Healthy Eating:  -Group instruction provided by PowerPoint slides, verbal discussion, and written materials to support subject matter. The instructor gives an explanation and review of the Therapeutic Lifestyle Changes diet recommendations, which includes a discussion on lipid goals, dietary fat, sodium, fiber, plant stanol/sterol esters, sugar, and the components of a well-balanced, healthy diet.   Nutrition II class: Lifestyle Skills:  -Group instruction provided by PowerPoint slides, verbal discussion, and written materials to support subject matter. The instructor gives an explanation and review of label reading, grocery shopping for heart health, heart  healthy recipe modifications, and ways to make healthier choices when eating out.   Diabetes Question & Answer:  -Group instruction provided by PowerPoint slides, verbal discussion, and written materials to support subject matter. The instructor gives an explanation and review of diabetes co-morbidities, pre- and post-prandial blood glucose goals, pre-exercise blood glucose goals, signs, symptoms, and treatment of hypoglycemia and hyperglycemia, and foot care basics.   Diabetes Blitz:  -Group  instruction provided by PowerPoint slides, verbal discussion, and written materials to support subject matter. The instructor gives an explanation and review of the physiology behind type 1 and type 2 diabetes, diabetes medications and rational behind using different medications, pre- and post-prandial blood glucose recommendations and Hemoglobin A1c goals, diabetes diet, and exercise including blood glucose guidelines for exercising safely.    Portion Distortion:  -Group instruction provided by PowerPoint slides, verbal discussion, written materials, and food models to support subject matter. The instructor gives an explanation of serving size versus portion size, changes in portions sizes over the last 20 years, and what consists of a serving from each food group.   Stress Management:  -Group instruction provided by verbal instruction, video, and written materials to support subject matter.  Instructors review role of stress in heart disease and how to cope with stress positively.     Exercising on Your Own:  -Group instruction provided by verbal instruction, power point, and written materials to support subject.  Instructors discuss benefits of exercise, components of exercise, frequency and intensity of exercise, and end points for exercise.  Also discuss use of nitroglycerin and activating EMS.  Review options of places to exercise outside of rehab.  Review guidelines for sex with heart  disease.   Cardiac Drugs I:  -Group instruction provided by verbal instruction and written materials to support subject.  Instructor reviews cardiac drug classes: antiplatelets, anticoagulants, beta blockers, and statins.  Instructor discusses reasons, side effects, and lifestyle considerations for each drug class.   Cardiac Drugs II:  -Group instruction provided by verbal instruction and written materials to support subject.  Instructor reviews cardiac drug classes: angiotensin converting enzyme inhibitors (ACE-I), angiotensin II receptor blockers (ARBs), nitrates, and calcium channel blockers.  Instructor discusses reasons, side effects, and lifestyle considerations for each drug class.   Anatomy and Physiology of the Circulatory System:  -Group instruction provided by verbal instruction, video, and written materials to support subject.  Reviews functional anatomy of heart, how it relates to various diagnoses, and what role the heart plays in the overall system.   Knowledge Questionnaire Score:   Core Components/Risk Factors/Patient Goals at Admission:     Personal Goals and Risk Factors at Admission - 02/28/17 0901      Core Components/Risk Factors/Patient Goals on Admission   Lipids Yes   Intervention Provide education and support for participant on nutrition & aerobic/resistive exercise along with prescribed medications to achieve LDL 70mg , HDL >40mg .   Expected Outcomes Short Term: Participant states understanding of desired cholesterol values and is compliant with medications prescribed. Participant is following exercise prescription and nutrition guidelines.;Long Term: Cholesterol controlled with medications as prescribed, with individualized exercise RX and with personalized nutrition plan. Value goals: LDL < 70mg , HDL > 40 mg.      Core Components/Risk Factors/Patient Goals Review:      Goals and Risk Factor Review    Row Name 03/08/17 1646 03/08/17 1647            Core Components/Risk Factors/Patient Goals Review   Personal Goals Review Lipids;Other  -      Review  - pt able to participate in group exercise setting without difficulty. pt is integrating well with equipment changes and routine with assistance of staff/volunteers.        Expected Outcomes  - pt will participate in CR for exercise, nutrition and risk factor education to make lifestyle modifications to decrease CAD progression and overall wellbeing.  Core Components/Risk Factors/Patient Goals at Discharge (Final Review):      Goals and Risk Factor Review - 03/08/17 1647      Core Components/Risk Factors/Patient Goals Review   Review pt able to participate in group exercise setting without difficulty. pt is integrating well with equipment changes and routine with assistance of staff/volunteers.     Expected Outcomes pt will participate in CR for exercise, nutrition and risk factor education to make lifestyle modifications to decrease CAD progression and overall wellbeing.       ITP Comments:     ITP Comments    Row Name 02/28/17 0856           ITP Comments Dr. Armanda Magic, Medical Director          Comments: Pt is making expected progress toward personal goals after completing 3 sessions. Recommend continued exercise and life style modification education including  stress management and relaxation techniques to decrease cardiac risk profile.

## 2017-03-10 ENCOUNTER — Encounter (HOSPITAL_COMMUNITY)
Admission: RE | Admit: 2017-03-10 | Discharge: 2017-03-10 | Disposition: A | Discharge status: Home / Self Care | Payer: Medicare HMO | Source: Ambulatory Visit | Attending: Cardiovascular Disease | Admitting: Cardiovascular Disease

## 2017-03-10 DIAGNOSIS — Z955 Presence of coronary angioplasty implant and graft: Secondary | ICD-10-CM

## 2017-03-10 DIAGNOSIS — I214 Non-ST elevation (NSTEMI) myocardial infarction: Secondary | ICD-10-CM

## 2017-03-13 ENCOUNTER — Encounter (HOSPITAL_COMMUNITY)
Admission: RE | Admit: 2017-03-13 | Discharge: 2017-03-13 | Disposition: A | Discharge status: Home / Self Care | Payer: Medicare HMO | Source: Ambulatory Visit | Attending: Cardiovascular Disease | Admitting: Cardiovascular Disease

## 2017-03-13 DIAGNOSIS — I214 Non-ST elevation (NSTEMI) myocardial infarction: Secondary | ICD-10-CM | POA: Insufficient documentation

## 2017-03-13 DIAGNOSIS — Z955 Presence of coronary angioplasty implant and graft: Secondary | ICD-10-CM

## 2017-03-15 ENCOUNTER — Encounter (HOSPITAL_COMMUNITY)
Admission: RE | Admit: 2017-03-15 | Discharge: 2017-03-15 | Disposition: A | Discharge status: Home / Self Care | Payer: Medicare HMO | Source: Ambulatory Visit | Attending: Cardiovascular Disease | Admitting: Cardiovascular Disease

## 2017-03-15 DIAGNOSIS — Z955 Presence of coronary angioplasty implant and graft: Secondary | ICD-10-CM

## 2017-03-15 DIAGNOSIS — I214 Non-ST elevation (NSTEMI) myocardial infarction: Secondary | ICD-10-CM | POA: Diagnosis not present

## 2017-03-17 ENCOUNTER — Encounter (HOSPITAL_COMMUNITY)
Admission: RE | Admit: 2017-03-17 | Discharge: 2017-03-17 | Disposition: A | Discharge status: Home / Self Care | Payer: Medicare HMO | Source: Ambulatory Visit | Attending: Cardiovascular Disease | Admitting: Cardiovascular Disease

## 2017-03-17 DIAGNOSIS — I214 Non-ST elevation (NSTEMI) myocardial infarction: Secondary | ICD-10-CM | POA: Diagnosis not present

## 2017-03-17 DIAGNOSIS — Z955 Presence of coronary angioplasty implant and graft: Secondary | ICD-10-CM

## 2017-03-17 NOTE — Progress Notes (Signed)
Reviewed home exercise guidelines with patient including endpoints, temperature precautions, and rate of perceived exertion. Pt is agreeable to walking 1-2 days per week at home in addition to exercise at cardiac rehab as his mode of home exercise. Pt verbalizes understanding of instructions given but has some memory deficits and will need reinforcement of exercise guidelines.  Artist Pais, MS, ACSM CEP

## 2017-03-20 ENCOUNTER — Encounter (HOSPITAL_COMMUNITY)
Admission: RE | Admit: 2017-03-20 | Discharge: 2017-03-20 | Disposition: A | Discharge status: Home / Self Care | Payer: Medicare HMO | Source: Ambulatory Visit | Attending: Cardiovascular Disease | Admitting: Cardiovascular Disease

## 2017-03-20 DIAGNOSIS — I214 Non-ST elevation (NSTEMI) myocardial infarction: Secondary | ICD-10-CM | POA: Diagnosis not present

## 2017-03-20 DIAGNOSIS — Z955 Presence of coronary angioplasty implant and graft: Secondary | ICD-10-CM

## 2017-03-22 ENCOUNTER — Encounter (HOSPITAL_COMMUNITY)
Admission: RE | Admit: 2017-03-22 | Discharge: 2017-03-22 | Disposition: A | Discharge status: Home / Self Care | Payer: Medicare HMO | Source: Ambulatory Visit | Attending: Cardiovascular Disease | Admitting: Cardiovascular Disease

## 2017-03-22 DIAGNOSIS — I214 Non-ST elevation (NSTEMI) myocardial infarction: Secondary | ICD-10-CM

## 2017-03-22 DIAGNOSIS — Z955 Presence of coronary angioplasty implant and graft: Secondary | ICD-10-CM

## 2017-03-24 ENCOUNTER — Encounter (HOSPITAL_COMMUNITY): Admission: RE | Admit: 2017-03-24 | Payer: Medicare HMO | Source: Ambulatory Visit

## 2017-03-24 ENCOUNTER — Encounter (HOSPITAL_COMMUNITY)
Admission: RE | Admit: 2017-03-24 | Discharge: 2017-03-24 | Disposition: A | Discharge status: Home / Self Care | Payer: Medicare HMO | Source: Ambulatory Visit | Attending: Cardiovascular Disease | Admitting: Cardiovascular Disease

## 2017-03-24 DIAGNOSIS — I214 Non-ST elevation (NSTEMI) myocardial infarction: Secondary | ICD-10-CM | POA: Diagnosis not present

## 2017-03-24 DIAGNOSIS — Z955 Presence of coronary angioplasty implant and graft: Secondary | ICD-10-CM

## 2017-03-27 ENCOUNTER — Encounter (HOSPITAL_COMMUNITY): Payer: Medicare HMO

## 2017-03-29 ENCOUNTER — Encounter (HOSPITAL_COMMUNITY)
Admission: RE | Admit: 2017-03-29 | Discharge: 2017-03-29 | Disposition: A | Discharge status: Home / Self Care | Payer: Medicare HMO | Source: Ambulatory Visit | Attending: Cardiovascular Disease | Admitting: Cardiovascular Disease

## 2017-03-29 DIAGNOSIS — Z955 Presence of coronary angioplasty implant and graft: Secondary | ICD-10-CM

## 2017-03-29 DIAGNOSIS — I214 Non-ST elevation (NSTEMI) myocardial infarction: Secondary | ICD-10-CM | POA: Diagnosis not present

## 2017-03-29 NOTE — Progress Notes (Signed)
Dale [name] 76 y.o. male Nutrition Note Spoke with pt. Pt with noted h/o Alzheimer's. Nutrition Survey reviewed with pt. Pt is following Step 2 of the Therapeutic Lifestyle Changes diet. Per RN, pt's daughter-in-law prepares pt meals. Pt expressed understanding of the information reviewed. Pt aware of nutrition education classes offered and plans on attending nutrition classes. Lab Results  Component Value Date   HGBA1C 5.3 08/07/2014   Wt Readings from Last 3 Encounters:  03/06/17 147 lb (66.7 kg)  02/28/17 148 lb 2.4 oz (67.2 kg)  02/07/17 142 lb (64.4 kg)   Nutrition Diagnosis ? Food-and nutrition-related knowledge deficit related to lack of exposure to information as related to diagnosis of: ? CVD Nutrition Intervention ? Benefits of adopting Therapeutic Lifestyle Changes discussed when Medficts reviewed. ? Pt to attend the Portion Distortion class ? Pt to attend the  ? Nutrition I class                      ? Nutrition II class ? Continue client-centered nutrition education by RD, as part of interdisciplinary care.  Goal(s) ? Pt to describe the benefit of including fruits, vegetables, whole grains, and low-fat dairy products in a heart healthy meal plan.  Monitor and Evaluate progress toward nutrition goal with team.  Mickle Plumb, M.Ed, RD, LDN, CDE 03/29/2017 2:15 PM

## 2017-03-31 ENCOUNTER — Encounter (HOSPITAL_COMMUNITY)
Admission: RE | Admit: 2017-03-31 | Discharge: 2017-03-31 | Disposition: A | Discharge status: Home / Self Care | Payer: Medicare HMO | Source: Ambulatory Visit | Attending: Cardiovascular Disease | Admitting: Cardiovascular Disease

## 2017-03-31 DIAGNOSIS — Z955 Presence of coronary angioplasty implant and graft: Secondary | ICD-10-CM

## 2017-03-31 DIAGNOSIS — I214 Non-ST elevation (NSTEMI) myocardial infarction: Secondary | ICD-10-CM

## 2017-04-03 ENCOUNTER — Encounter (HOSPITAL_COMMUNITY)
Admission: RE | Admit: 2017-04-03 | Discharge: 2017-04-03 | Disposition: A | Discharge status: Home / Self Care | Payer: Medicare HMO | Source: Ambulatory Visit | Attending: Cardiovascular Disease | Admitting: Cardiovascular Disease

## 2017-04-03 DIAGNOSIS — Z955 Presence of coronary angioplasty implant and graft: Secondary | ICD-10-CM

## 2017-04-03 DIAGNOSIS — I214 Non-ST elevation (NSTEMI) myocardial infarction: Secondary | ICD-10-CM

## 2017-04-05 ENCOUNTER — Encounter (HOSPITAL_COMMUNITY): Admission: RE | Admit: 2017-04-05 | Payer: Medicare HMO | Source: Ambulatory Visit

## 2017-04-05 ENCOUNTER — Encounter (HOSPITAL_COMMUNITY): Payer: Self-pay | Admitting: Cardiac Rehabilitation

## 2017-04-05 ENCOUNTER — Ambulatory Visit (INDEPENDENT_AMBULATORY_CARE_PROVIDER_SITE_OTHER): Payer: Medicare HMO | Admitting: Internal Medicine

## 2017-04-05 ENCOUNTER — Telehealth (HOSPITAL_COMMUNITY): Payer: Self-pay | Admitting: Cardiac Rehabilitation

## 2017-04-05 ENCOUNTER — Encounter: Payer: Self-pay | Admitting: Internal Medicine

## 2017-04-05 VITALS — BP 100/60 | HR 62 | Temp 98.1°F

## 2017-04-05 DIAGNOSIS — Z955 Presence of coronary angioplasty implant and graft: Secondary | ICD-10-CM

## 2017-04-05 DIAGNOSIS — I214 Non-ST elevation (NSTEMI) myocardial infarction: Secondary | ICD-10-CM

## 2017-04-05 DIAGNOSIS — S76312A Strain of muscle, fascia and tendon of the posterior muscle group at thigh level, left thigh, initial encounter: Secondary | ICD-10-CM | POA: Diagnosis not present

## 2017-04-05 NOTE — Progress Notes (Signed)
Cardiac Individual Treatment Plan  Patient Details  Name: Dale [name] MRN: [id number] Date of Birth: [birth year] Referring Provider:     CARDIAC REHAB PHASE II ORIENTATION from 02/28/2017 in [facility] CARD[facility]  Referring Provider  Andree Coss MD      Initial Encounter Date:    CARDIAC REHAB PHASE II ORIENTATION from 02/28/2017 in [facility] CARDIAC REHAB  Date  02/28/17  Referring Provider  Andree Coss MD      Visit Diagnosis: 12/30/16 NSTEMI (non-ST elevated myocardial infarction) (HCC)  12/30/16 Status post coronary artery stent placement  Patient's Home Medications on Admission:  Current Outpatient Prescriptions:  .  aspirin 81 MG chewable tablet, Chew 1 tablet (81 mg total) by mouth daily., Disp: 30 tablet, Rfl: 3 .  donepezil (ARICEPT) 10 MG tablet, Take 1 tablet (10 mg total) by mouth at bedtime., Disp: 90 tablet, Rfl: 4 .  memantine (NAMENDA) 10 MG tablet, Take 1 tablet (10 mg total) by mouth 2 (two) times daily., Disp: 180 tablet, Rfl: 4 .  nitroGLYCERIN (NITROSTAT) 0.4 MG SL tablet, Place 1 tablet (0.4 mg total) under the tongue every 5 (five) minutes x 3 doses as needed for chest pain., Disp: 25 tablet, Rfl: 12 .  pantoprazole (PROTONIX) 40 MG tablet, Take 1 tablet (40 mg total) by mouth daily., Disp: 30 tablet, Rfl: 11 .  pravastatin (PRAVACHOL) 40 MG tablet, TAKE 1 TABLET(40 MG) BY MOUTH DAILY, Disp: 90 tablet, Rfl: 5 .  tamsulosin (FLOMAX) 0.4 MG CAPS capsule, TAKE 1 CAPSULE(0.4 MG) BY MOUTH DAILY AFTER SUPPER, Disp: 90 capsule, Rfl: 2 .  ticagrelor (BRILINTA) 90 MG TABS tablet, Take 1 tablet (90 mg total) by mouth 2 (two) times daily., Disp: 180 tablet, Rfl: 3  Past Medical History: Past Medical History:  Diagnosis Date  . Coronary artery disease   . Hyperlipemia   . Leg mass    rt  . Memory loss   . Myocardial infarction less than 4 weeks ago (HCC) 12/29/2016  . Prostate disease   . S/P angioplasty with stent to  LCX     Tobacco Use: History  Smoking Status  . Former Smoker  . Packs/day: 1.00  . Years: 20.00  . Types: Cigarettes  . Quit date: 10/23/2011  Smokeless Tobacco  . Never Used    Labs: Recent Review Flowsheet Data    Labs for ITP Cardiac and Pulmonary Rehab Latest Ref Rng & Units 11/20/2008 05/27/2013 08/07/2014 12/31/2016   Cholestrol 0 - 200 mg/dL [phone]   LDLCALC 0 - 99 mg/dL 956(O) 130(Q) 657(Q) 51   HDL >40 mg/dL 60 62 65 60   Trlycerides <150 mg/dL 76 59 43 41   Hemoglobin A1c <5.7 % - - 5.3 -      Capillary Blood Glucose: No results found for: GLUCAP   Exercise Target Goals:    Exercise Program Goal: Individual exercise prescription set with THRR, safety & activity barriers. Participant demonstrates ability to understand and report RPE using BORG scale, to self-measure pulse accurately, and to acknowledge the importance of the exercise prescription.  Exercise Prescription Goal: Starting with aerobic activity 30 plus minutes a day, 3 days per week for initial exercise prescription. Provide home exercise prescription and guidelines that participant acknowledges understanding prior to discharge.  Activity Barriers & Risk Stratification:     Activity Barriers & Cardiac Risk Stratification - 02/28/17 0859      Activity Barriers & Cardiac Risk Stratification   Activity  Barriers Deconditioning;Muscular Weakness;Other (comment)   Comments L shoulder pain   Cardiac Risk Stratification High      6 Minute Walk:     6 Minute Walk    Row Name 02/28/17 1346         6 Minute Walk   Phase Initial     Distance 2133 feet     Walk Time 6 minutes     # of Rest Breaks 0     MPH 4.04     METS 4.53     RPE 12     VO2 Peak 15.86     Symptoms Yes (comment)     Comments leg fatigue at end of walk test     Resting HR 52 bpm     Resting BP 122/68     Max Ex. HR 109 bpm     Max Ex. BP 154/78     2 Minute Post BP 114/57        Oxygen Initial  Assessment:   Oxygen Re-Evaluation:   Oxygen Discharge (Final Oxygen Re-Evaluation):   Initial Exercise Prescription:     Initial Exercise Prescription - 02/28/17 1400      Date of Initial Exercise RX and Referring Provider   Date 02/28/17   Referring Provider Andree Coss MD     Bike   Level 0.7   Minutes 10   METs 2.89     NuStep   Level 3   SPM 80   Minutes 10   METs 2.4     Track   Laps 13   Minutes 10   METs 3.26     Prescription Details   Frequency (times per week) 3   Duration Progress to 30 minutes of continuous aerobic without signs/symptoms of physical distress     Intensity   THRR 40-80% of Max Heartrate 59-118   Ratings of Perceived Exertion 11-13   Perceived Dyspnea 0-4     Progression   Progression Continue progressive overload as per policy without signs/symptoms or physical distress.     Resistance Training   Training Prescription Yes   Weight 3lbs   Reps 10-15      Perform Capillary Blood Glucose checks as needed.  Exercise Prescription Changes:     Exercise Prescription Changes    Row Name 03/07/17 1600 03/23/17 1600 04/04/17 1500         Response to Exercise   Blood Pressure (Admit) 122/70 102/68 128/60     Blood Pressure (Exercise) 140/84 118/70 140/84     Blood Pressure (Exit) 102/60 110/64 138/70     Heart Rate (Admit) 59 bpm 82 bpm 78 bpm     Heart Rate (Exercise) 111 bpm 97 bpm 121 bpm     Heart Rate (Exit) 59 bpm 63 bpm 66 bpm     Rating of Perceived Exertion (Exercise) 11 13 13      Symptoms none none none     Duration Continue with 30 min of aerobic exercise without signs/symptoms of physical distress. Continue with 30 min of aerobic exercise without signs/symptoms of physical distress. Continue with 30 min of aerobic exercise without signs/symptoms of physical distress.     Intensity THRR unchanged THRR unchanged THRR unchanged       Progression   Progression  - Continue to progress workloads to maintain  intensity without signs/symptoms of physical distress. Continue to progress workloads to maintain intensity without signs/symptoms of physical distress.     Average METs  - 2.3 3.3  Resistance Training   Training Prescription Yes Yes Yes     Weight 2lbs 5lbs 3lbs     Reps 10-15 10-15 10-15     Time 10 Minutes 10 Minutes 10 Minutes       Bike   Level 0.7  - 1     Minutes 10  - 15     METs 15  - 3.83       NuStep   Level 3 3  -     SPM 80 80  -     Minutes 15 15  -     METs 1.7 1.7  -       Track   Laps  - 16 16     Minutes  - 15 15     METs  - 2.86 2.86       Home Exercise Plan   Plans to continue exercise at  - Home (comment)  walking Home (comment)  walking     Frequency  - Add 2 additional days to program exercise sessions. Add 2 additional days to program exercise sessions.     Initial Home Exercises Provided  - 03/17/17 03/17/17        Exercise Comments:     Exercise Comments    Row Name 03/07/17 1624 03/17/17 1517 04/04/17 1554       Exercise Comments Pt completed first day of exercise. Pt was oriented to exercise equipment and handled exercise very well; will continue to monitor pt's progress with exercise. Reviewed individual home exercise guidelines with patient. Pt is agreeable to walking 1-2 days per week in addition to exercise at cardiac rehab. Will need reinforcement. Reviewed METs and goals. Pt is tolerating exercise well; will continue to monitor exercise progression/activity level.        Exercise Goals and Review:     Exercise Goals    Row Name 02/28/17 0900             Exercise Goals   Increase Physical Activity Yes       Intervention Provide advice, education, support and counseling about physical activity/exercise needs.;Develop an individualized exercise prescription for aerobic and resistive training based on initial evaluation findings, risk stratification, comorbidities and participant's personal goals.       Expected Outcomes  Achievement of increased cardiorespiratory fitness and enhanced flexibility, muscular endurance and strength shown through measurements of functional capacity and personal statement of participant.       Increase Strength and Stamina Yes       Intervention Provide advice, education, support and counseling about physical activity/exercise needs.;Develop an individualized exercise prescription for aerobic and resistive training based on initial evaluation findings, risk stratification, comorbidities and participant's personal goals.       Expected Outcomes Achievement of increased cardiorespiratory fitness and enhanced flexibility, muscular endurance and strength shown through measurements of functional capacity and personal statement of participant.          Exercise Goals Re-Evaluation :     Exercise Goals Re-Evaluation    Row Name 04/04/17 1554 04/04/17 1555 04/04/17 1557         Exercise Goal Re-Evaluation   Exercise Goals Review Increase Physical Activity;Increase Strenth and Stamina  -  -     Comments  - Pt is back to cutting grass with riding lawn mower. Discussed temperature and emergency precautions, pt verbalized understanding. May need to be reinforced Pt is back to cutting grass with riding lawn mower. Discussed temperature and emergency precautions, pt  verbalized understanding. May need to be reinforced/reviewed.      Expected Outcomes  - Disc Pt will continue to build on aerobic capacity, functional activities, and overall well-being         Discharge Exercise Prescription (Final Exercise Prescription Changes):     Exercise Prescription Changes - 04/04/17 1500      Response to Exercise   Blood Pressure (Admit) 128/60   Blood Pressure (Exercise) 140/84   Blood Pressure (Exit) 138/70   Heart Rate (Admit) 78 bpm   Heart Rate (Exercise) 121 bpm   Heart Rate (Exit) 66 bpm   Rating of Perceived Exertion (Exercise) 13   Symptoms none   Duration Continue with 30 min of  aerobic exercise without signs/symptoms of physical distress.   Intensity THRR unchanged     Progression   Progression Continue to progress workloads to maintain intensity without signs/symptoms of physical distress.   Average METs 3.3     Resistance Training   Training Prescription Yes   Weight 3lbs   Reps 10-15   Time 10 Minutes     Bike   Level 1   Minutes 15   METs 3.83     Track   Laps 16   Minutes 15   METs 2.86     Home Exercise Plan   Plans to continue exercise at Home (comment)  walking   Frequency Add 2 additional days to program exercise sessions.   Initial Home Exercises Provided 03/17/17      Nutrition:  Target Goals: Understanding of nutrition guidelines, daily intake of sodium 1500mg , cholesterol 200mg , calories 30% from fat and 7% or less from saturated fats, daily to have 5 or more servings of fruits and vegetables.  Biometrics:     Pre Biometrics - 02/28/17 1349      Pre Biometrics   Waist Circumference 35 inches   Hip Circumference 39 inches   Waist to Hip Ratio 0.9 %   Triceps Skinfold 15 mm   % Body Fat 24.8 %   Grip Strength 34 kg   Flexibility 12 in   Single Leg Stand 4 seconds       Nutrition Therapy Plan and Nutrition Goals:     Nutrition Therapy & Goals - 03/29/17 1418      Nutrition Therapy   Diet Therapeutic Lifestyle Changes     Personal Nutrition Goals   Nutrition Goal Pt to maintain his wt while in Cardiac Rehab     Intervention Plan   Intervention Prescribe, educate and counsel regarding individualized specific dietary modifications aiming towards targeted core components such as weight, hypertension, lipid management, diabetes, heart failure and other comorbidities.   Expected Outcomes Short Term Goal: Understand basic principles of dietary content, such as calories, fat, sodium, cholesterol and nutrients.;Long Term Goal: Adherence to prescribed nutrition plan.      Nutrition Discharge: Nutrition Scores:      Nutrition Assessments - 03/29/17 1418      MEDFICTS Scores   Pre Score 27      Nutrition Goals Re-Evaluation:   Nutrition Goals Re-Evaluation:   Nutrition Goals Discharge (Final Nutrition Goals Re-Evaluation):   Psychosocial: Target Goals: Acknowledge presence or absence of significant depression and/or stress, maximize coping skills, provide positive support system. Participant is able to verbalize types and ability to use techniques and skills needed for reducing stress and depression.  Initial Review & Psychosocial Screening:     Initial Psych Review & Screening - 02/28/17 1615  Initial Review   Current issues with None Identified     Family Dynamics   Good Support System? Yes     Barriers   Psychosocial barriers to participate in program There are no identifiable barriers or psychosocial needs.     Screening Interventions   Interventions Encouraged to exercise      Quality of Life Scores:     Quality of Life - 03/15/17 1535      Quality of Life Scores   Health/Function Pre 26 %  overall scores satisfactory. pt has concerns about his overall health with mild cardiac symptoms currently.     Socioeconomic Pre 29.25 %   Psych/Spiritual Pre 28.29 %   Family Pre 28.8 %   GLOBAL Pre 27.6 %      PHQ-9: Recent Review Flowsheet Data    Depression screen [facility] 2/9 04/05/2017 03/06/2017 12/27/2016 10/12/2016 09/14/2016   Decreased Interest 0 0 0 0 0   Down, Depressed, Hopeless 0 0 0 0 0   PHQ - 2 Score 0 0 0 0 0     Interpretation of Total Score  Total Score Depression Severity:  1-4 = Minimal depression, 5-9 = Mild depression, 10-14 = Moderate depression, 15-19 = Moderately severe depression, 20-27 = Severe depression   Psychosocial Evaluation and Intervention:     Psychosocial Evaluation - 03/06/17 1612      Psychosocial Evaluation & Interventions   Interventions Stress management education;Relaxation education;Encouraged to exercise with the program  and follow exercise prescription   Comments pt with memory deficit treated by neuro.   pt son and daughter assist patient although he lives alone. pt does admit to anger associated with the role reversals.     Expected Outcomes pt will demonstrate positive outlook with good coping skills.     Continue Psychosocial Services  Follow up required by staff      Psychosocial Re-Evaluation:     Psychosocial Re-Evaluation    Row Name 03/08/17 1650 04/04/17 1151           Psychosocial Re-Evaluation   Current issues with Current Stress Concerns;Current Anxiety/Panic Current Stress Concerns;Current Anxiety/Panic      Comments current dementia  current dementia, pt verbalizes improved relationship with his adult children.        Expected Outcomes pt will exhibit good coping skills with positive outlook.   pt will exhibit good coping skills with positive outlook.        Interventions Encouraged to attend Cardiac Rehabilitation for the exercise;Relaxation education;Stress management education Encouraged to attend Cardiac Rehabilitation for the exercise;Relaxation education;Stress management education      Continue Psychosocial Services  Follow up required by staff Follow up required by staff      Comments pt son and daughter are very involved in pt care and well being, although pt does live alone.   -        Initial Review   Source of Stress Concerns Chronic Illness;Unable to perform yard/household activities;Unable to participate in former interests or hobbies  -         Psychosocial Discharge (Final Psychosocial Re-Evaluation):     Psychosocial Re-Evaluation - 04/04/17 1151      Psychosocial Re-Evaluation   Current issues with Current Stress Concerns;Current Anxiety/Panic   Comments current dementia, pt verbalizes improved relationship with his adult children.     Expected Outcomes pt will exhibit good coping skills with positive outlook.     Interventions Encouraged to attend Cardiac  Rehabilitation for the exercise;Relaxation education;Stress  management education   Continue Psychosocial Services  Follow up required by staff      Vocational Rehabilitation: Provide vocational rehab assistance to qualifying candidates.   Vocational Rehab Evaluation & Intervention:     Vocational Rehab - 03/15/17 1531      Initial Vocational Rehab Evaluation & Intervention   Assessment shows need for Vocational Rehabilitation No      Education: Education Goals: Education classes will be provided on a weekly basis, covering required topics. Participant will state understanding/return demonstration of topics presented.  Learning Barriers/Preferences:     Learning Barriers/Preferences - 02/28/17 0981      Learning Barriers/Preferences   Learning Barriers Exercise Concerns  memory deficit      Education Topics: Count Your Pulse:  -Group instruction provided by verbal instruction, demonstration, patient participation and written materials to support subject.  Instructors address importance of being able to find your pulse and how to count your pulse when at home without a heart monitor.  Patients get hands on experience counting their pulse with staff help and individually.   Heart Attack, Angina, and Risk Factor Modification:  -Group instruction provided by verbal instruction, video, and written materials to support subject.  Instructors address signs and symptoms of angina and heart attacks.    Also discuss risk factors for heart disease and how to make changes to improve heart health risk factors.   Functional Fitness:  -Group instruction provided by verbal instruction, demonstration, patient participation, and written materials to support subject.  Instructors address safety measures for doing things around the house.  Discuss how to get up and down off the floor, how to pick things up properly, how to safely get out of a chair without assistance, and balance training.    CARDIAC REHAB PHASE II EXERCISE from 04/03/2017 in [facility] CARDIAC [facility]  Instruction Review Code  R- Review/reinforce      Meditation and Mindfulness:  -Group instruction provided by verbal instruction, patient participation, and written materials to support subject.  Instructor addresses importance of mindfulness and meditation practice to help reduce stress and improve awareness.  Instructor also leads participants through a meditation exercise.    CARDIAC REHAB PHASE II EXERCISE from 03/31/2017 in [facility] CARDIAC REHAB  Date  03/29/17  Instruction Review Code  2- meets goals/outcomes      Stretching for Flexibility and Mobility:  -Group instruction provided by verbal instruction, patient participation, and written materials to support subject.  Instructors lead participants through series of stretches that are designed to increase flexibility thus improving mobility.  These stretches are additional exercise for major muscle groups that are typically performed during regular warm up and cool down.   Hands Only CPR Anytime:  -Group instruction provided by verbal instruction, video, patient participation and written materials to support subject.  Instructors co-teach with AHA video for hands only CPR.  Participants get hands on experience with mannequins.   Nutrition I class: Heart Healthy Eating:  -Group instruction provided by PowerPoint slides, verbal discussion, and written materials to support subject matter. The instructor gives an explanation and review of the Therapeutic Lifestyle Changes diet recommendations, which includes a discussion on lipid goals, dietary fat, sodium, fiber, plant stanol/sterol esters, sugar, and the components of a well-balanced, healthy diet.   Nutrition II class: Lifestyle Skills:  -Group instruction provided by PowerPoint slides, verbal discussion, and written materials to support subject matter. The instructor  gives an explanation and review of label reading, grocery shopping  for heart health, heart healthy recipe modifications, and ways to make healthier choices when eating out.   Diabetes Question & Answer:  -Group instruction provided by PowerPoint slides, verbal discussion, and written materials to support subject matter. The instructor gives an explanation and review of diabetes co-morbidities, pre- and post-prandial blood glucose goals, pre-exercise blood glucose goals, signs, symptoms, and treatment of hypoglycemia and hyperglycemia, and foot care basics.   Diabetes Blitz:  -Group instruction provided by PowerPoint slides, verbal discussion, and written materials to support subject matter. The instructor gives an explanation and review of the physiology behind type 1 and type 2 diabetes, diabetes medications and rational behind using different medications, pre- and post-prandial blood glucose recommendations and Hemoglobin A1c goals, diabetes diet, and exercise including blood glucose guidelines for exercising safely.    Portion Distortion:  -Group instruction provided by PowerPoint slides, verbal discussion, written materials, and food models to support subject matter. The instructor gives an explanation of serving size versus portion size, changes in portions sizes over the last 20 years, and what consists of a serving from each food group.   Stress Management:  -Group instruction provided by verbal instruction, video, and written materials to support subject matter.  Instructors review role of stress in heart disease and how to cope with stress positively.     Exercising on Your Own:  -Group instruction provided by verbal instruction, power point, and written materials to support subject.  Instructors discuss benefits of exercise, components of exercise, frequency and intensity of exercise, and end points for exercise.  Also discuss use of nitroglycerin and activating EMS.  Review options of  places to exercise outside of rehab.  Review guidelines for sex with heart disease.   Cardiac Drugs I:  -Group instruction provided by verbal instruction and written materials to support subject.  Instructor reviews cardiac drug classes: antiplatelets, anticoagulants, beta blockers, and statins.  Instructor discusses reasons, side effects, and lifestyle considerations for each drug class.   CARDIAC REHAB PHASE II EXERCISE from 03/31/2017 in [facility] CARDIAC REHAB  Date  03/22/17  Educator  Pharmacist  Instruction Review Code  2- meets goals/outcomes      Cardiac Drugs II:  -Group instruction provided by verbal instruction and written materials to support subject.  Instructor reviews cardiac drug classes: angiotensin converting enzyme inhibitors (ACE-I), angiotensin II receptor blockers (ARBs), nitrates, and calcium channel blockers.  Instructor discusses reasons, side effects, and lifestyle considerations for each drug class.   Anatomy and Physiology of the Circulatory System:  -Group instruction provided by verbal instruction, video, and written materials to support subject.  Reviews functional anatomy of heart, how it relates to various diagnoses, and what role the heart plays in the overall system.   CARDIAC REHAB PHASE II EXERCISE from 03/31/2017 in [facility] CARDIAC REHAB  Date  03/15/17  Instruction Review Code  2- meets goals/outcomes      Knowledge Questionnaire Score:     Knowledge Questionnaire Score - 03/13/17 1511      Knowledge Questionnaire Score   Pre Score 19/24      Core Components/Risk Factors/Patient Goals at Admission:     Personal Goals and Risk Factors at Admission - 02/28/17 0901      Core Components/Risk Factors/Patient Goals on Admission   Lipids Yes   Intervention Provide education and support for participant on nutrition & aerobic/resistive exercise along with prescribed medications to achieve LDL 70mg , HDL  >40mg .   Expected Outcomes Short Term: Participant  states understanding of desired cholesterol values and is compliant with medications prescribed. Participant is following exercise prescription and nutrition guidelines.;Long Term: Cholesterol controlled with medications as prescribed, with individualized exercise RX and with personalized nutrition plan. Value goals: LDL < 70mg , HDL > 40 mg.      Core Components/Risk Factors/Patient Goals Review:      Goals and Risk Factor Review    Row Name 03/08/17 1646 03/08/17 1647 04/04/17 1150         Core Components/Risk Factors/Patient Goals Review   Personal Goals Review Lipids;Other  - Lipids;Other     Review  - pt able to participate in group exercise setting without difficulty. pt is integrating well with equipment changes and routine with assistance of staff/volunteers.   pt able to participate in group exercise setting without difficulty. pt is integrating well with equipment changes and routine with assistance of staff/volunteers.  pt reports he has noticed increased strength/stamina and is pleased to be able to resume some of his usual home routine.  pt eats small frequent meals, and notices he feels weaker when he has forgotten to eat.      Expected Outcomes  - pt will participate in CR for exercise, nutrition and risk factor education to make lifestyle modifications to decrease CAD progression and overall wellbeing.  pt will participate in CR for exercise, nutrition and risk factor education to make lifestyle modifications to decrease CAD progression and overall wellbeing.         Core Components/Risk Factors/Patient Goals at Discharge (Final Review):      Goals and Risk Factor Review - 04/04/17 1150      Core Components/Risk Factors/Patient Goals Review   Personal Goals Review Lipids;Other   Review pt able to participate in group exercise setting without difficulty. pt is integrating well with equipment changes and routine with  assistance of staff/volunteers.  pt reports he has noticed increased strength/stamina and is pleased to be able to resume some of his usual home routine.  pt eats small frequent meals, and notices he feels weaker when he has forgotten to eat.    Expected Outcomes pt will participate in CR for exercise, nutrition and risk factor education to make lifestyle modifications to decrease CAD progression and overall wellbeing.       ITP Comments:     ITP Comments    Row Name 02/28/17 0856           ITP Comments Dr. Armanda Magic, Medical Director          Comments: Pt is making expected progress toward personal goals after completing 12 sessions. Recommend continued exercise and life style modification education including  stress management and relaxation techniques to decrease cardiac risk profile.

## 2017-04-05 NOTE — Telephone Encounter (Signed)
Pt arrived at cardiac rehab 30 minutes late, c/o left leg pain.  BP:  127/68.    Pt states " I think I pulled something running"  Pt denies tender to touch, pt has noticeable discomfort with ambulation.  Homans sign-negative.  pc to pt PCP [facility] who advised pt could be seen as work in today. Pt escorted via WC.

## 2017-04-05 NOTE — Progress Notes (Signed)
   [facility] Phone: [phone]  Subjective:  Dale [name] is a 76 year old male presenting to clinic as a walk-in for left leg pain. He was "jogging" to cardiac rehab earlier this afternoon because he was running late. He was crossing the sidewalk when he felt a "pull" in the back of his left thigh. The leg has continued to bother him since then. The pain is worse with bending over, when he touches the back of the leg, and when he "moves a certain way". The pain is better with rubbing the hamstring. He was sent over to clinic from cardiac rehab for further evaluation.  ROS: See HPI for pertinent positives and negatives  Past Medical History- hx NSTEMI in 01/2017, GERD, mild cognitive impairment, BPH, HLD  Family history reviewed for today's visit. No changes.  Social history- patient is a former smoker  Objective: BP 100/60   Pulse 62   Temp 98.1 F (36.7 C) (Oral)   SpO2 98%  Gen: NAD, alert, cooperative with exam Msk: No erythema, no swelling, no gross deformity. Focal area of tenderness of the left hamstring. Pain with bending over at the waist and resisted flexion of the knee.  Assessment/Plan: Hamstring Strain: History and exam consistent with hamstring strain. - Treat conservatively with Tylenol q6hrs prn - Advised patient to apply ice to the area for 20 minutes every few hours for the next couple of days - Patient can try some over the counter Aspercreme - Advised to rest the hamstring for ~3 days, then start performing rehabilitation exercises (patient given handout with exercises) - Avoid anything that irritates the hamstring to allow it to heal - Follow-up if not improving over the next couple of weeks.   Dale Carol, MD PGY-2

## 2017-04-05 NOTE — Patient Instructions (Addendum)
It was so nice to meet you!  I think you have a hamstring strain. I would recommend taking Tylenol every 6 hours as needed for pain. You should apply ice to the area frequently over the next couple of days. You can also get some over the counter Aspercreme to rub on the area to help with pain.   I would recommend low activity over the next week to let the hamstring heal. After about 3 days, you can start doing some of the exercises in the handout I gave you.  If you are not starting to get better in 1-2 weeks, please come back to see [place]!  -Dr. Nancy Marus

## 2017-04-06 DIAGNOSIS — S76312A Strain of muscle, fascia and tendon of the posterior muscle group at thigh level, left thigh, initial encounter: Secondary | ICD-10-CM | POA: Insufficient documentation

## 2017-04-06 NOTE — Assessment & Plan Note (Signed)
History and exam consistent with hamstring strain. - Treat conservatively with Tylenol q6hrs prn - Advised patient to apply ice to the area for 20 minutes every few hours for the next couple of days - Patient can try some over the counter Aspercreme - Advised to rest the hamstring for ~3 days, then start performing rehabilitation exercises (patient given handout with exercises) - Avoid anything that irritates the hamstring to allow it to heal - Follow-up if not improving over the next couple of weeks.

## 2017-04-07 ENCOUNTER — Encounter (HOSPITAL_COMMUNITY)
Admission: RE | Admit: 2017-04-07 | Discharge: 2017-04-07 | Disposition: A | Discharge status: Home / Self Care | Payer: Medicare HMO | Source: Ambulatory Visit | Attending: Cardiovascular Disease | Admitting: Cardiovascular Disease

## 2017-04-07 DIAGNOSIS — I214 Non-ST elevation (NSTEMI) myocardial infarction: Secondary | ICD-10-CM | POA: Diagnosis not present

## 2017-04-07 DIAGNOSIS — Z955 Presence of coronary angioplasty implant and graft: Secondary | ICD-10-CM

## 2017-04-10 ENCOUNTER — Encounter (HOSPITAL_COMMUNITY)
Admission: RE | Admit: 2017-04-10 | Discharge: 2017-04-10 | Disposition: A | Discharge status: Home / Self Care | Payer: Medicare HMO | Source: Ambulatory Visit | Attending: Cardiovascular Disease | Admitting: Cardiovascular Disease

## 2017-04-10 DIAGNOSIS — I214 Non-ST elevation (NSTEMI) myocardial infarction: Secondary | ICD-10-CM | POA: Diagnosis not present

## 2017-04-10 DIAGNOSIS — Z955 Presence of coronary angioplasty implant and graft: Secondary | ICD-10-CM

## 2017-04-12 ENCOUNTER — Encounter (HOSPITAL_COMMUNITY)
Admission: RE | Admit: 2017-04-12 | Discharge: 2017-04-12 | Disposition: A | Discharge status: Home / Self Care | Payer: Medicare HMO | Source: Ambulatory Visit | Attending: Cardiovascular Disease | Admitting: Cardiovascular Disease

## 2017-04-12 DIAGNOSIS — I214 Non-ST elevation (NSTEMI) myocardial infarction: Secondary | ICD-10-CM | POA: Insufficient documentation

## 2017-04-12 DIAGNOSIS — Z955 Presence of coronary angioplasty implant and graft: Secondary | ICD-10-CM

## 2017-04-14 ENCOUNTER — Encounter (HOSPITAL_COMMUNITY)
Admission: RE | Admit: 2017-04-14 | Discharge: 2017-04-14 | Disposition: A | Discharge status: Home / Self Care | Payer: Medicare HMO | Source: Ambulatory Visit | Attending: Cardiovascular Disease | Admitting: Cardiovascular Disease

## 2017-04-14 DIAGNOSIS — I214 Non-ST elevation (NSTEMI) myocardial infarction: Secondary | ICD-10-CM

## 2017-04-14 DIAGNOSIS — Z955 Presence of coronary angioplasty implant and graft: Secondary | ICD-10-CM

## 2017-04-17 ENCOUNTER — Encounter (HOSPITAL_COMMUNITY)
Admission: RE | Admit: 2017-04-17 | Discharge: 2017-04-17 | Disposition: A | Discharge status: Home / Self Care | Payer: Medicare HMO | Source: Ambulatory Visit | Attending: Cardiovascular Disease | Admitting: Cardiovascular Disease

## 2017-04-17 DIAGNOSIS — I214 Non-ST elevation (NSTEMI) myocardial infarction: Secondary | ICD-10-CM | POA: Diagnosis not present

## 2017-04-17 DIAGNOSIS — Z955 Presence of coronary angioplasty implant and graft: Secondary | ICD-10-CM

## 2017-04-19 ENCOUNTER — Encounter (HOSPITAL_COMMUNITY)
Admission: RE | Admit: 2017-04-19 | Discharge: 2017-04-19 | Disposition: A | Discharge status: Home / Self Care | Payer: Medicare HMO | Source: Ambulatory Visit | Attending: Cardiovascular Disease | Admitting: Cardiovascular Disease

## 2017-04-19 DIAGNOSIS — I214 Non-ST elevation (NSTEMI) myocardial infarction: Secondary | ICD-10-CM | POA: Diagnosis not present

## 2017-04-19 DIAGNOSIS — Z955 Presence of coronary angioplasty implant and graft: Secondary | ICD-10-CM

## 2017-04-21 ENCOUNTER — Encounter (HOSPITAL_COMMUNITY)
Admission: RE | Admit: 2017-04-21 | Discharge: 2017-04-21 | Disposition: A | Discharge status: Home / Self Care | Payer: Medicare HMO | Source: Ambulatory Visit | Attending: Cardiovascular Disease | Admitting: Cardiovascular Disease

## 2017-04-21 DIAGNOSIS — Z955 Presence of coronary angioplasty implant and graft: Secondary | ICD-10-CM

## 2017-04-21 DIAGNOSIS — I214 Non-ST elevation (NSTEMI) myocardial infarction: Secondary | ICD-10-CM | POA: Diagnosis not present

## 2017-04-24 ENCOUNTER — Encounter (HOSPITAL_COMMUNITY)
Admission: RE | Admit: 2017-04-24 | Discharge: 2017-04-24 | Disposition: A | Discharge status: Home / Self Care | Payer: Medicare HMO | Source: Ambulatory Visit | Attending: Cardiovascular Disease | Admitting: Cardiovascular Disease

## 2017-04-24 DIAGNOSIS — I214 Non-ST elevation (NSTEMI) myocardial infarction: Secondary | ICD-10-CM | POA: Diagnosis not present

## 2017-04-24 DIAGNOSIS — Z955 Presence of coronary angioplasty implant and graft: Secondary | ICD-10-CM

## 2017-04-26 ENCOUNTER — Encounter (HOSPITAL_COMMUNITY)
Admission: RE | Admit: 2017-04-26 | Discharge: 2017-04-26 | Disposition: A | Discharge status: Home / Self Care | Payer: Medicare HMO | Source: Ambulatory Visit | Attending: Cardiovascular Disease | Admitting: Cardiovascular Disease

## 2017-04-26 DIAGNOSIS — Z955 Presence of coronary angioplasty implant and graft: Secondary | ICD-10-CM

## 2017-04-26 DIAGNOSIS — I214 Non-ST elevation (NSTEMI) myocardial infarction: Secondary | ICD-10-CM

## 2017-04-28 ENCOUNTER — Encounter (HOSPITAL_COMMUNITY)
Admission: RE | Admit: 2017-04-28 | Discharge: 2017-04-28 | Disposition: A | Discharge status: Home / Self Care | Payer: Medicare HMO | Source: Ambulatory Visit | Attending: Cardiovascular Disease | Admitting: Cardiovascular Disease

## 2017-04-28 DIAGNOSIS — I214 Non-ST elevation (NSTEMI) myocardial infarction: Secondary | ICD-10-CM

## 2017-04-28 DIAGNOSIS — Z955 Presence of coronary angioplasty implant and graft: Secondary | ICD-10-CM

## 2017-05-01 ENCOUNTER — Encounter (HOSPITAL_COMMUNITY)
Admission: RE | Admit: 2017-05-01 | Discharge: 2017-05-01 | Disposition: A | Discharge status: Home / Self Care | Payer: Medicare HMO | Source: Ambulatory Visit | Attending: Cardiovascular Disease | Admitting: Cardiovascular Disease

## 2017-05-01 DIAGNOSIS — I214 Non-ST elevation (NSTEMI) myocardial infarction: Secondary | ICD-10-CM | POA: Diagnosis not present

## 2017-05-01 DIAGNOSIS — Z955 Presence of coronary angioplasty implant and graft: Secondary | ICD-10-CM

## 2017-05-02 NOTE — Progress Notes (Signed)
Cardiac Individual Treatment Plan  Patient Details  Name: Dale MCCLAREN MRN: [id number] Date of Birth: Dec 20, 1940 Referring Provider:     CARDIAC REHAB PHASE II ORIENTATION from 02/28/2017 in [facility] CARDIAC [facility]  Referring Provider  Andree Coss MD      Initial Encounter Date:    CARDIAC REHAB PHASE II ORIENTATION from 02/28/2017 in [facility] CARDIAC REHAB  Date  02/28/17  Referring Provider  Andree Coss MD      Visit Diagnosis: 12/30/16 NSTEMI (non-ST elevated myocardial infarction) (HCC)  12/30/16 Status post coronary artery stent placement  Patient's Home Medications on Admission:  Current Outpatient Prescriptions:  .  aspirin 81 MG chewable tablet, Chew 1 tablet (81 mg total) by mouth daily., Disp: 30 tablet, Rfl: 3 .  donepezil (ARICEPT) 10 MG tablet, Take 1 tablet (10 mg total) by mouth at bedtime., Disp: 90 tablet, Rfl: 4 .  memantine (NAMENDA) 10 MG tablet, Take 1 tablet (10 mg total) by mouth 2 (two) times daily., Disp: 180 tablet, Rfl: 4 .  nitroGLYCERIN (NITROSTAT) 0.4 MG SL tablet, Place 1 tablet (0.4 mg total) under the tongue every 5 (five) minutes x 3 doses as needed for chest pain., Disp: 25 tablet, Rfl: 12 .  pantoprazole (PROTONIX) 40 MG tablet, Take 1 tablet (40 mg total) by mouth daily., Disp: 30 tablet, Rfl: 11 .  pravastatin (PRAVACHOL) 40 MG tablet, TAKE 1 TABLET(40 MG) BY MOUTH DAILY, Disp: 90 tablet, Rfl: 5 .  tamsulosin (FLOMAX) 0.4 MG CAPS capsule, TAKE 1 CAPSULE(0.4 MG) BY MOUTH DAILY AFTER SUPPER, Disp: 90 capsule, Rfl: 2 .  ticagrelor (BRILINTA) 90 MG TABS tablet, Take 1 tablet (90 mg total) by mouth 2 (two) times daily., Disp: 180 tablet, Rfl: 3  Past Medical History: Past Medical History:  Diagnosis Date  . Coronary artery disease   . Hyperlipemia   . Leg mass    rt  . Memory loss   . Myocardial infarction less than 4 weeks ago (HCC) 12/29/2016  . Prostate disease   . S/P angioplasty with stent to  LCX     Tobacco Use: History  Smoking Status  . Former Smoker  . Packs/day: 1.00  . Years: 20.00  . Types: Cigarettes  . Quit date: 10/23/2011  Smokeless Tobacco  . Never Used    Labs: Recent Review Flowsheet Data    Labs for ITP Cardiac and Pulmonary Rehab Latest Ref Rng & Units 11/20/2008 05/27/2013 08/07/2014 12/31/2016   Cholestrol 0 - 200 mg/dL 213 086 578 469   LDLCALC 0 - 99 mg/dL 629(B) 284(X) 324(M) 51   HDL >40 mg/dL 60 62 65 60   Trlycerides <150 mg/dL 76 59 43 41   Hemoglobin A1c <5.7 % - - 5.3 -      Capillary Blood Glucose: No results found for: GLUCAP   Exercise Target Goals:    Exercise Program Goal: Individual exercise prescription set with THRR, safety & activity barriers. Participant demonstrates ability to understand and report RPE using BORG scale, to self-measure pulse accurately, and to acknowledge the importance of the exercise prescription.  Exercise Prescription Goal: Starting with aerobic activity 30 plus minutes a day, 3 days per week for initial exercise prescription. Provide home exercise prescription and guidelines that participant acknowledges understanding prior to discharge.  Activity Barriers & Risk Stratification:     Activity Barriers & Cardiac Risk Stratification - 02/28/17 0859      Activity Barriers & Cardiac Risk Stratification   Activity  Barriers Deconditioning;Muscular Weakness;Other (comment)   Comments L shoulder pain   Cardiac Risk Stratification High      6 Minute Walk:     6 Minute Walk    Row Name 02/28/17 1346         6 Minute Walk   Phase Initial     Distance 2133 feet     Walk Time 6 minutes     # of Rest Breaks 0     MPH 4.04     METS 4.53     RPE 12     VO2 Peak 15.86     Symptoms Yes (comment)     Comments leg fatigue at end of walk test     Resting HR 52 bpm     Resting BP 122/68     Max Ex. HR 109 bpm     Max Ex. BP 154/78     2 Minute Post BP 114/57        Oxygen Initial  Assessment:   Oxygen Re-Evaluation:   Oxygen Discharge (Final Oxygen Re-Evaluation):   Initial Exercise Prescription:     Initial Exercise Prescription - 02/28/17 1400      Date of Initial Exercise RX and Referring Provider   Date 02/28/17   Referring Provider Andree Coss MD     Bike   Level 0.7   Minutes 10   METs 2.89     NuStep   Level 3   SPM 80   Minutes 10   METs 2.4     Track   Laps 13   Minutes 10   METs 3.26     Prescription Details   Frequency (times per week) 3   Duration Progress to 30 minutes of continuous aerobic without signs/symptoms of physical distress     Intensity   THRR 40-80% of Max Heartrate 59-118   Ratings of Perceived Exertion 11-13   Perceived Dyspnea 0-4     Progression   Progression Continue progressive overload as per policy without signs/symptoms or physical distress.     Resistance Training   Training Prescription Yes   Weight 3lbs   Reps 10-15      Perform Capillary Blood Glucose checks as needed.  Exercise Prescription Changes:     Exercise Prescription Changes    Row Name 03/07/17 1600 03/23/17 1600 04/04/17 1500 04/19/17 1000 05/02/17 1400     Response to Exercise   Blood Pressure (Admit) 122/70 102/68 128/60 138/64 128/84   Blood Pressure (Exercise) 140/84 118/70 140/84 130/62 120/78   Blood Pressure (Exit) 102/60 110/64 138/70 120/80 124/68   Heart Rate (Admit) 59 bpm 82 bpm 78 bpm 75 bpm 90 bpm   Heart Rate (Exercise) 111 bpm 97 bpm 121 bpm 112 bpm 113 bpm   Heart Rate (Exit) 59 bpm 63 bpm 66 bpm 60 bpm 73 bpm   Rating of Perceived Exertion (Exercise) 11 13 13 12 11    Symptoms none none none none none   Duration Continue with 30 min of aerobic exercise without signs/symptoms of physical distress. Continue with 30 min of aerobic exercise without signs/symptoms of physical distress. Continue with 30 min of aerobic exercise without signs/symptoms of physical distress. Continue with 30 min of aerobic exercise  without signs/symptoms of physical distress. Continue with 30 min of aerobic exercise without signs/symptoms of physical distress.   Intensity THRR unchanged THRR unchanged THRR unchanged THRR unchanged THRR unchanged     Progression   Progression  - Continue to progress workloads  to maintain intensity without signs/symptoms of physical distress. Continue to progress workloads to maintain intensity without signs/symptoms of physical distress. Continue to progress workloads to maintain intensity without signs/symptoms of physical distress. Continue to progress workloads to maintain intensity without signs/symptoms of physical distress.   Average METs  - 2.3 3.3 3.1 3.6     Resistance Training   Training Prescription Yes Yes Yes Yes Yes   Weight 2lbs 5lbs 3lbs 3lbs 5lbs   Reps 10-15 10-15 10-15 10-15 10-15   Time 10 Minutes 10 Minutes 10 Minutes 10 Minutes 10 Minutes     Bike   Level 0.7  - 1  - 1   Minutes 10  - 15  - 15   METs 15  - 3.83  - 3.83     NuStep   Level 3 3  - 3  -   SPM 80 80  - 90  -   Minutes 15 15  - 15  -   METs 1.7 1.7  - 3.3  -     Track   Laps  - 16 16 16 20    Minutes  - 15 15 15 15    METs  - 2.86 2.86 2.86 3.32     Home Exercise Plan   Plans to continue exercise at  - Home (comment)  walking Home (comment)  walking Home (comment)  walking Home (comment)  walking   Frequency  - Add 2 additional days to program exercise sessions. Add 2 additional days to program exercise sessions. Add 2 additional days to program exercise sessions. Add 2 additional days to program exercise sessions.   Initial Home Exercises Provided  - 03/17/17 03/17/17 03/17/17 03/17/17      Exercise Comments:     Exercise Comments    Row Name 03/07/17 1624 03/17/17 1517 04/04/17 1554 05/02/17 1423     Exercise Comments Pt completed first day of exercise. Pt was oriented to exercise equipment and handled exercise very well; will continue to monitor pt's progress with exercise. Reviewed  individual home exercise guidelines with patient. Pt is agreeable to walking 1-2 days per week in addition to exercise at cardiac rehab. Will need reinforcement. Reviewed METs and goals. Pt is tolerating exercise well; will continue to monitor exercise progression/activity level. Reviewed METs and goals. Pt is tolerating exercise well; will continue to monitor exercise progression/activity level.       Exercise Goals and Review:     Exercise Goals    Row Name 02/28/17 0900             Exercise Goals   Increase Physical Activity Yes       Intervention Provide advice, education, support and counseling about physical activity/exercise needs.;Develop an individualized exercise prescription for aerobic and resistive training based on initial evaluation findings, risk stratification, comorbidities and participant's personal goals.       Expected Outcomes Achievement of increased cardiorespiratory fitness and enhanced flexibility, muscular endurance and strength shown through measurements of functional capacity and personal statement of participant.       Increase Strength and Stamina Yes       Intervention Provide advice, education, support and counseling about physical activity/exercise needs.;Develop an individualized exercise prescription for aerobic and resistive training based on initial evaluation findings, risk stratification, comorbidities and participant's personal goals.       Expected Outcomes Achievement of increased cardiorespiratory fitness and enhanced flexibility, muscular endurance and strength shown through measurements of functional capacity and personal statement of participant.  Exercise Goals Re-Evaluation :     Exercise Goals Re-Evaluation    Row Name 04/04/17 1554 04/04/17 1555 04/04/17 1557 05/02/17 1423 05/02/17 1425     Exercise Goal Re-Evaluation   Exercise Goals Review Increase Physical Activity;Increase Strenth and Stamina  -  - Increase Physical  Activity;Increase Strenth and Stamina  -   Comments  - Pt is back to cutting grass with riding lawn mower. Discussed temperature and emergency precautions, pt verbalized understanding. May need to be reinforced Pt is back to cutting grass with riding lawn mower. Discussed temperature and emergency precautions, pt verbalized understanding. May need to be reinforced/reviewed.  Pt has increased walking tolerance. Pt also noticed an increase in energy levels at home when doing ADL's Pt has increased walking tolerance from 14 laps to 20 laps. Pt also noticed an increase in energy levels at home when doing ADL's   Expected Outcomes  - Disc Pt will continue to build on aerobic capacity, functional activities, and overall well-being  - Pt will continue to build on aerobic capacity, functional activities, and overall well-being       Discharge Exercise Prescription (Final Exercise Prescription Changes):     Exercise Prescription Changes - 05/02/17 1400      Response to Exercise   Blood Pressure (Admit) 128/84   Blood Pressure (Exercise) 120/78   Blood Pressure (Exit) 124/68   Heart Rate (Admit) 90 bpm   Heart Rate (Exercise) 113 bpm   Heart Rate (Exit) 73 bpm   Rating of Perceived Exertion (Exercise) 11   Symptoms none   Duration Continue with 30 min of aerobic exercise without signs/symptoms of physical distress.   Intensity THRR unchanged     Progression   Progression Continue to progress workloads to maintain intensity without signs/symptoms of physical distress.   Average METs 3.6     Resistance Training   Training Prescription Yes   Weight 5lbs   Reps 10-15   Time 10 Minutes     Bike   Level 1   Minutes 15   METs 3.83     Track   Laps 20   Minutes 15   METs 3.32     Home Exercise Plan   Plans to continue exercise at Home (comment)  walking   Frequency Add 2 additional days to program exercise sessions.   Initial Home Exercises Provided 03/17/17      Nutrition:  Target  Goals: Understanding of nutrition guidelines, daily intake of sodium 1500mg , cholesterol 200mg , calories 30% from fat and 7% or less from saturated fats, daily to have 5 or more servings of fruits and vegetables.  Biometrics:     Pre Biometrics - 02/28/17 1349      Pre Biometrics   Waist Circumference 35 inches   Hip Circumference 39 inches   Waist to Hip Ratio 0.9 %   Triceps Skinfold 15 mm   % Body Fat 24.8 %   Grip Strength 34 kg   Flexibility 12 in   Single Leg Stand 4 seconds       Nutrition Therapy Plan and Nutrition Goals:     Nutrition Therapy & Goals - 03/29/17 1418      Nutrition Therapy   Diet Therapeutic Lifestyle Changes     Personal Nutrition Goals   Nutrition Goal Pt to maintain his wt while in Cardiac Rehab     Intervention Plan   Intervention Prescribe, educate and counsel regarding individualized specific dietary modifications aiming towards targeted core components such as  weight, hypertension, lipid management, diabetes, heart failure and other comorbidities.   Expected Outcomes Short Term Goal: Understand basic principles of dietary content, such as calories, fat, sodium, cholesterol and nutrients.;Long Term Goal: Adherence to prescribed nutrition plan.      Nutrition Discharge: Nutrition Scores:     Nutrition Assessments - 03/29/17 1418      MEDFICTS Scores   Pre Score 27      Nutrition Goals Re-Evaluation:   Nutrition Goals Re-Evaluation:   Nutrition Goals Discharge (Final Nutrition Goals Re-Evaluation):   Psychosocial: Target Goals: Acknowledge presence or absence of significant depression and/or stress, maximize coping skills, provide positive support system. Participant is able to verbalize types and ability to use techniques and skills needed for reducing stress and depression.  Initial Review & Psychosocial Screening:     Initial Psych Review & Screening - 02/28/17 1615      Initial Review   Current issues with None  Identified     Family Dynamics   Good Support System? Yes     Barriers   Psychosocial barriers to participate in program There are no identifiable barriers or psychosocial needs.     Screening Interventions   Interventions Encouraged to exercise      Quality of Life Scores:     Quality of Life - 03/15/17 1535      Quality of Life Scores   Health/Function Pre 26 %  overall scores satisfactory. pt has concerns about his overall health with mild cardiac symptoms currently.     Socioeconomic Pre 29.25 %   Psych/Spiritual Pre 28.29 %   Family Pre 28.8 %   GLOBAL Pre 27.6 %      PHQ-9: Recent Review Flowsheet Data    Depression screen [facility] 2/9 04/05/2017 03/06/2017 12/27/2016 10/12/2016 09/14/2016   Decreased Interest 0 0 0 0 0   Down, Depressed, Hopeless 0 0 0 0 0   PHQ - 2 Score 0 0 0 0 0     Interpretation of Total Score  Total Score Depression Severity:  1-4 = Minimal depression, 5-9 = Mild depression, 10-14 = Moderate depression, 15-19 = Moderately severe depression, 20-27 = Severe depression   Psychosocial Evaluation and Intervention:     Psychosocial Evaluation - 03/06/17 1612      Psychosocial Evaluation & Interventions   Interventions Stress management education;Relaxation education;Encouraged to exercise with the program and follow exercise prescription   Comments pt with memory deficit treated by neuro.   pt son and daughter assist patient although he lives alone. pt does admit to anger associated with the role reversals.     Expected Outcomes pt will demonstrate positive outlook with good coping skills.     Continue Psychosocial Services  Follow up required by staff      Psychosocial Re-Evaluation:     Psychosocial Re-Evaluation    Row Name 03/08/17 1650 04/04/17 1151 04/28/17 0739         Psychosocial Re-Evaluation   Current issues with Current Stress Concerns;Current Anxiety/Panic Current Stress Concerns;Current Anxiety/Panic Current Stress  Concerns;Current Anxiety/Panic     Comments current dementia  current dementia, pt verbalizes improved relationship with his adult children.   current dementia, pt verbalizes improved relationship with his adult children.  pt demonstrates uplifted spirit and ease with current situation.      Expected Outcomes pt will exhibit good coping skills with positive outlook.   pt will exhibit good coping skills with positive outlook.   pt will exhibit good coping skills with positive  outlook.       Interventions Encouraged to attend Cardiac Rehabilitation for the exercise;Relaxation education;Stress management education Encouraged to attend Cardiac Rehabilitation for the exercise;Relaxation education;Stress management education Encouraged to attend Cardiac Rehabilitation for the exercise;Relaxation education;Stress management education     Continue Psychosocial Services  Follow up required by staff Follow up required by staff Follow up required by staff     Comments pt son and daughter are very involved in pt care and well being, although pt does live alone.   -  -       Initial Review   Source of Stress Concerns Chronic Illness;Unable to perform yard/household activities;Unable to participate in former interests or hobbies  -  -        Psychosocial Discharge (Final Psychosocial Re-Evaluation):     Psychosocial Re-Evaluation - 04/28/17 0739      Psychosocial Re-Evaluation   Current issues with Current Stress Concerns;Current Anxiety/Panic   Comments current dementia, pt verbalizes improved relationship with his adult children.  pt demonstrates uplifted spirit and ease with current situation.    Expected Outcomes pt will exhibit good coping skills with positive outlook.     Interventions Encouraged to attend Cardiac Rehabilitation for the exercise;Relaxation education;Stress management education   Continue Psychosocial Services  Follow up required by staff      Vocational Rehabilitation: Provide  vocational rehab assistance to qualifying candidates.   Vocational Rehab Evaluation & Intervention:     Vocational Rehab - 03/15/17 1531      Initial Vocational Rehab Evaluation & Intervention   Assessment shows need for Vocational Rehabilitation No      Education: Education Goals: Education classes will be provided on a weekly basis, covering required topics. Participant will state understanding/return demonstration of topics presented.  Learning Barriers/Preferences:     Learning Barriers/Preferences - 02/28/17 9983      Learning Barriers/Preferences   Learning Barriers Exercise Concerns  memory deficit      Education Topics: Count Your Pulse:  -Group instruction provided by verbal instruction, demonstration, patient participation and written materials to support subject.  Instructors address importance of being able to find your pulse and how to count your pulse when at home without a heart monitor.  Patients get hands on experience counting their pulse with staff help and individually.   CARDIAC REHAB PHASE II EXERCISE from 04/28/2017 in [facility] CARDIAC REHAB  Date  04/14/17  Instruction Review Code  2- meets goals/outcomes      Heart Attack, Angina, and Risk Factor Modification:  -Group instruction provided by verbal instruction, video, and written materials to support subject.  Instructors address signs and symptoms of angina and heart attacks.    Also discuss risk factors for heart disease and how to make changes to improve heart health risk factors.   Functional Fitness:  -Group instruction provided by verbal instruction, demonstration, patient participation, and written materials to support subject.  Instructors address safety measures for doing things around the house.  Discuss how to get up and down off the floor, how to pick things up properly, how to safely get out of a chair without assistance, and balance training.   CARDIAC REHAB PHASE II  EXERCISE from 04/28/2017 in [facility] CARDIAC REHAB  Date  04/28/17  Educator  Cristy Hilts  Instruction Review Code  2- meets goals/outcomes      Meditation and Mindfulness:  -Group instruction provided by verbal instruction, patient participation, and written materials to support subject.  Instructor addresses importance of mindfulness and meditation practice to help reduce stress and improve awareness.  Instructor also leads participants through a meditation exercise.    CARDIAC REHAB PHASE II EXERCISE from 04/28/2017 in [facility] CARDIAC REHAB  Date  03/29/17  Instruction Review Code  2- meets goals/outcomes      Stretching for Flexibility and Mobility:  -Group instruction provided by verbal instruction, patient participation, and written materials to support subject.  Instructors lead participants through series of stretches that are designed to increase flexibility thus improving mobility.  These stretches are additional exercise for major muscle groups that are typically performed during regular warm up and cool down.   CARDIAC REHAB PHASE II EXERCISE from 04/28/2017 in [facility] CARDIAC REHAB  Date  04/07/17  Instruction Review Code  2- meets goals/outcomes      Hands Only CPR:  -Group verbal, video, and participation provides a basic overview of AHA guidelines for community CPR. Role-play of emergencies allow participants the opportunity to practice calling for help and chest compression technique with discussion of AED use.   Hypertension: -Group verbal and written instruction that provides a basic overview of hypertension including the most recent diagnostic guidelines, risk factor reduction with self-care instructions and medication management.    Nutrition I class: Heart Healthy Eating:  -Group instruction provided by PowerPoint slides, verbal discussion, and written materials to support subject matter. The  instructor gives an explanation and review of the Therapeutic Lifestyle Changes diet recommendations, which includes a discussion on lipid goals, dietary fat, sodium, fiber, plant stanol/sterol esters, sugar, and the components of a well-balanced, healthy diet.   Nutrition II class: Lifestyle Skills:  -Group instruction provided by PowerPoint slides, verbal discussion, and written materials to support subject matter. The instructor gives an explanation and review of label reading, grocery shopping for heart health, heart healthy recipe modifications, and ways to make healthier choices when eating out.   Diabetes Question & Answer:  -Group instruction provided by PowerPoint slides, verbal discussion, and written materials to support subject matter. The instructor gives an explanation and review of diabetes co-morbidities, pre- and post-prandial blood glucose goals, pre-exercise blood glucose goals, signs, symptoms, and treatment of hypoglycemia and hyperglycemia, and foot care basics.   Diabetes Blitz:  -Group instruction provided by PowerPoint slides, verbal discussion, and written materials to support subject matter. The instructor gives an explanation and review of the physiology behind type 1 and type 2 diabetes, diabetes medications and rational behind using different medications, pre- and post-prandial blood glucose recommendations and Hemoglobin A1c goals, diabetes diet, and exercise including blood glucose guidelines for exercising safely.    Portion Distortion:  -Group instruction provided by PowerPoint slides, verbal discussion, written materials, and food models to support subject matter. The instructor gives an explanation of serving size versus portion size, changes in portions sizes over the last 20 years, and what consists of a serving from each food group.   Stress Management:  -Group instruction provided by verbal instruction, video, and written materials to support subject  matter.  Instructors review role of stress in heart disease and how to cope with stress positively.     CARDIAC REHAB PHASE II EXERCISE from 04/28/2017 in [facility] CARDIAC REHAB  Date  04/12/17  Instruction Review Code  2- meets goals/outcomes      Exercising on Your Own:  -Group instruction provided by verbal instruction, power point, and written materials to support subject.  Instructors discuss benefits  of exercise, components of exercise, frequency and intensity of exercise, and end points for exercise.  Also discuss use of nitroglycerin and activating EMS.  Review options of places to exercise outside of rehab.  Review guidelines for sex with heart disease.   Cardiac Drugs I:  -Group instruction provided by verbal instruction and written materials to support subject.  Instructor reviews cardiac drug classes: antiplatelets, anticoagulants, beta blockers, and statins.  Instructor discusses reasons, side effects, and lifestyle considerations for each drug class.   CARDIAC REHAB PHASE II EXERCISE from 04/28/2017 in [facility] [facility]  Date  03/22/17  Educator  Pharmacist  Instruction Review Code  2- meets goals/outcomes      Cardiac Drugs II:  -Group instruction provided by verbal instruction and written materials to support subject.  Instructor reviews cardiac drug classes: angiotensin converting enzyme inhibitors (ACE-I), angiotensin II receptor blockers (ARBs), nitrates, and calcium channel blockers.  Instructor discusses reasons, side effects, and lifestyle considerations for each drug class.   CARDIAC REHAB PHASE II EXERCISE from 04/28/2017 in [facility] CARDIAC REHAB  Date  04/19/17  Instruction Review Code  2- meets goals/outcomes      Anatomy and Physiology of the Circulatory System:  Group verbal and written instruction and models provide basic cardiac anatomy and physiology, with the coronary electrical and arterial  systems. Review of: AMI, Angina, Valve disease, Heart Failure, Peripheral Artery Disease, Cardiac Arrhythmia, Pacemakers, and the ICD.   CARDIAC REHAB PHASE II EXERCISE from 04/28/2017 in [facility] CARDIAC REHAB  Date  03/15/17  Instruction Review Code  2- meets goals/outcomes      Other Education:  -Group or individual verbal, written, or video instructions that support the educational goals of the cardiac rehab program.   Knowledge Questionnaire Score:     Knowledge Questionnaire Score - 03/13/17 1511      Knowledge Questionnaire Score   Pre Score 19/24      Core Components/Risk Factors/Patient Goals at Admission:     Personal Goals and Risk Factors at Admission - 02/28/17 0901      Core Components/Risk Factors/Patient Goals on Admission   Lipids Yes   Intervention Provide education and support for participant on nutrition & aerobic/resistive exercise along with prescribed medications to achieve LDL 70mg , HDL >40mg .   Expected Outcomes Short Term: Participant states understanding of desired cholesterol values and is compliant with medications prescribed. Participant is following exercise prescription and nutrition guidelines.;Long Term: Cholesterol controlled with medications as prescribed, with individualized exercise RX and with personalized nutrition plan. Value goals: LDL < 70mg , HDL > 40 mg.      Core Components/Risk Factors/Patient Goals Review:      Goals and Risk Factor Review    Row Name 03/08/17 1646 03/08/17 1647 04/04/17 1150 04/28/17 0738       Core Components/Risk Factors/Patient Goals Review   Personal Goals Review Lipids;Other  - Lipids;Other  -    Review  - pt able to participate in group exercise setting without difficulty. pt is integrating well with equipment changes and routine with assistance of staff/volunteers.   pt able to participate in group exercise setting without difficulty. pt is integrating well with equipment changes and  routine with assistance of staff/volunteers.  pt reports he has noticed increased strength/stamina and is pleased to be able to resume some of his usual home routine.  pt eats small frequent meals, and notices he feels weaker when he has forgotten to eat.  pt  able to participate in group exercise setting without difficulty. pt is integrating well with equipment changes and routine without assistance.  pt reports he has noticed increased strength/stamina and is pleased to be able to resume some of his usual home and work routine.       Expected Outcomes  - pt will participate in CR for exercise, nutrition and risk factor education to make lifestyle modifications to decrease CAD progression and overall wellbeing.  pt will participate in CR for exercise, nutrition and risk factor education to make lifestyle modifications to decrease CAD progression and overall wellbeing.  pt will participate in CR for exercise, nutrition and risk factor education to make lifestyle modifications to decrease CAD progression and overall wellbeing.        Core Components/Risk Factors/Patient Goals at Discharge (Final Review):      Goals and Risk Factor Review - 04/28/17 0738      Core Components/Risk Factors/Patient Goals Review   Review pt able to participate in group exercise setting without difficulty. pt is integrating well with equipment changes and routine without assistance.  pt reports he has noticed increased strength/stamina and is pleased to be able to resume some of his usual home and work routine.      Expected Outcomes pt will participate in CR for exercise, nutrition and risk factor education to make lifestyle modifications to decrease CAD progression and overall wellbeing.       ITP Comments:     ITP Comments    Row Name 02/28/17 0856           ITP Comments Dr. Armanda Magic, Medical Director          Comments: Pt is making expected progress toward personal goals after completing 24 sessions.  Recommend continued exercise and life style modification education including  stress management and relaxation techniques to decrease cardiac risk profile.

## 2017-05-03 ENCOUNTER — Encounter (HOSPITAL_COMMUNITY)
Admission: RE | Admit: 2017-05-03 | Discharge: 2017-05-03 | Disposition: A | Discharge status: Home / Self Care | Payer: Medicare HMO | Source: Ambulatory Visit | Attending: Cardiovascular Disease | Admitting: Cardiovascular Disease

## 2017-05-03 DIAGNOSIS — I214 Non-ST elevation (NSTEMI) myocardial infarction: Secondary | ICD-10-CM | POA: Diagnosis not present

## 2017-05-03 DIAGNOSIS — Z955 Presence of coronary angioplasty implant and graft: Secondary | ICD-10-CM

## 2017-05-05 ENCOUNTER — Encounter (HOSPITAL_COMMUNITY)
Admission: RE | Admit: 2017-05-05 | Discharge: 2017-05-05 | Disposition: A | Discharge status: Home / Self Care | Payer: Medicare HMO | Source: Ambulatory Visit | Attending: Cardiovascular Disease | Admitting: Cardiovascular Disease

## 2017-05-05 DIAGNOSIS — Z955 Presence of coronary angioplasty implant and graft: Secondary | ICD-10-CM

## 2017-05-05 DIAGNOSIS — I214 Non-ST elevation (NSTEMI) myocardial infarction: Secondary | ICD-10-CM | POA: Diagnosis not present

## 2017-05-10 ENCOUNTER — Encounter (HOSPITAL_COMMUNITY)
Admission: RE | Admit: 2017-05-10 | Discharge: 2017-05-10 | Disposition: A | Discharge status: Home / Self Care | Payer: Medicare HMO | Source: Ambulatory Visit | Attending: Cardiovascular Disease | Admitting: Cardiovascular Disease

## 2017-05-10 DIAGNOSIS — I214 Non-ST elevation (NSTEMI) myocardial infarction: Secondary | ICD-10-CM

## 2017-05-10 DIAGNOSIS — Z955 Presence of coronary angioplasty implant and graft: Secondary | ICD-10-CM

## 2017-05-12 ENCOUNTER — Encounter (HOSPITAL_COMMUNITY)
Admission: RE | Admit: 2017-05-12 | Discharge: 2017-05-12 | Disposition: A | Discharge status: Home / Self Care | Payer: Medicare HMO | Source: Ambulatory Visit | Attending: Cardiovascular Disease | Admitting: Cardiovascular Disease

## 2017-05-12 DIAGNOSIS — Z955 Presence of coronary angioplasty implant and graft: Secondary | ICD-10-CM

## 2017-05-12 DIAGNOSIS — I214 Non-ST elevation (NSTEMI) myocardial infarction: Secondary | ICD-10-CM

## 2017-05-15 ENCOUNTER — Encounter (HOSPITAL_COMMUNITY)
Admission: RE | Admit: 2017-05-15 | Discharge: 2017-05-15 | Disposition: A | Discharge status: Home / Self Care | Payer: Medicare HMO | Source: Ambulatory Visit | Attending: Cardiovascular Disease | Admitting: Cardiovascular Disease

## 2017-05-15 DIAGNOSIS — Z955 Presence of coronary angioplasty implant and graft: Secondary | ICD-10-CM

## 2017-05-15 DIAGNOSIS — I214 Non-ST elevation (NSTEMI) myocardial infarction: Secondary | ICD-10-CM | POA: Diagnosis not present

## 2017-05-15 NOTE — Progress Notes (Signed)
Daily Session Note  Patient Details  Name: Dale [name] MRN: [id number] Date of Birth: [birth year] Referring Provider:     CARDIAC REHAB PHASE II ORIENTATION from 02/28/2017 in [place]  Referring Provider  Ancil Linsey MD      Encounter Date: 05/15/2017  Check In:     Session Check In - 05/15/17 1408      Check-In   Location [facility]   Staff Present Luetta Nutting Fair, MS, ACSM RCEP, Exercise Physiologist;Olinty Mitchell, MS, ACSM CEP, Exercise Physiologist;Joann Rion, RN, Marga Melnick, RN, BSN   Supervising physician immediately available to respond to emergencies [facility] immediately available   Physician(s) Dr. Broadus John   Medication changes reported     No   Fall or balance concerns reported    No   Tobacco Cessation No Change   Warm-up and Cool-down Performed as group-led instruction   Resistance Training Performed Yes   VAD Patient? No     Pain Assessment   Currently in Pain? No/denies      Capillary Blood Glucose: No results found for this or any previous visit (from the past 24 hour(s)).    History  Smoking Status  . Former Smoker  . Packs/day: 1.00  . Years: 20.00  . Types: Cigarettes  . Quit date: 10/23/2011  Smokeless Tobacco  . Never Used    Goals Met:  Exercise tolerated well  Goals Unmet:  Not Applicable  Comments: pt arrived at cardiac rehab reporting episodes of epigastric and RUQ discomfort this weekend, associated with gas and indigestion.  Symptoms worse at rest, with bending over and anxiety. Pt denies symptoms with exertion.  Pt denies as anginal equivalent.  Abdomen soft, RUQ tender to touch, BS+ all 4 quadrants.  Pt tolerated cardiac rehab exercise without difficulty.  PC to [facility] clinic.  appt scheduled for 05/16/17 @ 10:30am.  Written and verbal instruction given.   Dr. Fransico Him is Medical Director for Cardiac Rehab at [facility].

## 2017-05-16 ENCOUNTER — Ambulatory Visit (INDEPENDENT_AMBULATORY_CARE_PROVIDER_SITE_OTHER): Payer: Medicare HMO | Admitting: Family Medicine

## 2017-05-16 ENCOUNTER — Encounter: Payer: Self-pay | Admitting: Family Medicine

## 2017-05-16 VITALS — BP 120/70 | HR 65 | Temp 98.0°F | Ht 64.5 in | Wt 148.0 lb

## 2017-05-16 DIAGNOSIS — R1011 Right upper quadrant pain: Secondary | ICD-10-CM | POA: Diagnosis not present

## 2017-05-16 NOTE — Progress Notes (Signed)
   Subjective:   Patient ID: Dale [name]    DOB: 22-Nov-1941, 76 y.o. male   MRN: [id number]  CC: "Abdominal pain"  HPI: Dale [name] is a 76 y.o. male who presents to clinic today for SDA concerning abdominal pain. Problems discussed today are as follows:  Abdominal pain: Onset over the past 6 months intermittently since NSTEMI 12/2016. Patient states pain is located on right abdomen radiating up to the RUQ. Triggered when patient "thinks about it or raises his voice." Patient states pain does not feel consistent with prior MI. Has GERD on PPI but cannot describe if this is a similar pain or different. Pain not triggered with eating or relieved with eating. Denies history of abdominal surgeries. Says he has some SOB with pain. ROS: Denies fevers or chills, nausea or vomiting, eructation, melena or hematochezia, chest pain, change in weight.  Complete ROS performed, see HPI for pertinent.  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 H/o cardiac cath 12/2016 with x1 DOS on nondominant circumflex. Smoking status reviewed. Medications reviewed.  Objective:   BP 120/70   Pulse 65   Temp 98 F (36.7 C) (Oral)   Ht 5' 4.5" (1.638 m)   Wt 148 lb (67.1 kg)   SpO2 98%   BMI 25.01 kg/m  Vitals and nursing note reviewed.  General: pleasant, well nourished, well developed, in no acute distress with non-toxic appearance HEENT: normocephalic, atraumatic, moist mucous membranes CV: regular rate and rhythm without murmurs, rubs, or gallops, no lower extremity edema Lungs: clear to auscultation bilaterally with normal work of breathing Abdomen: soft, non-tender, non-distended, no masses or organomegaly palpable, normoactive bowel sounds, negative Murphy sign Skin: warm, dry, no rashes or lesions, cap refill < 2 seconds Extremities: warm and well perfused, normal tone  Assessment & Plan:   RUQ abdominal pain Subacute. Not consistent with ACS. Differential includes worsening  reflux, gastritis, symptomatic cholelithiasis, abdominal gas. Currently on PPI for GERD. --Will obtain CMP and CBC --Could consider RUQ ultrasound to rule out cholelithiasis --Instructed patient to record in general episodes over the next 2 weeks --RTC 2 weeks for reassessment of abdominal pain  No orders of the defined types were placed in this encounter.  No orders of the defined types were placed in this encounter.   Durward Parcel, DO [facility], PGY-1 05/16/2017 10:53 AM

## 2017-05-16 NOTE — Assessment & Plan Note (Signed)
Subacute. Not consistent with ACS. Differential includes worsening reflux, gastritis, symptomatic cholelithiasis, abdominal gas. Currently on PPI for GERD. --Will obtain CMP and CBC --Could consider RUQ ultrasound to rule out cholelithiasis --Instructed patient to record in general episodes over the next 2 weeks --RTC 2 weeks for reassessment of abdominal pain

## 2017-05-16 NOTE — Patient Instructions (Addendum)
Thank you for coming in to see [place] today. Please see below to review our plan for today's visit.  1. It is not clear where her abdominal pain is from. I do not think it's related to your heart. We will need to do further testing and have follow-up to help her out what is going on. 2. I will notify you of your lab results if they are abnormal.  3. Use a journal and record the abdominal pain episodes for the next 2 weeks. It is important you bring the journal during your next visit. Record the following:  Time of pain?  Location of pain?  What were you doing?  How long did at last?  Associated symptoms? 4. Return to the clinic in 2 weeks to discuss her abdominal pain.  Please call the clinic at [phone] if your symptoms worsen or you have any concerns. It was my pleasure to see you. -- Durward Parcel, DO [facility], PGY-1

## 2017-05-17 ENCOUNTER — Encounter (HOSPITAL_COMMUNITY)
Admission: RE | Admit: 2017-05-17 | Discharge: 2017-05-17 | Disposition: A | Discharge status: Home / Self Care | Payer: Medicare HMO | Source: Ambulatory Visit | Attending: Cardiovascular Disease | Admitting: Cardiovascular Disease

## 2017-05-17 ENCOUNTER — Encounter: Payer: Self-pay | Admitting: Family Medicine

## 2017-05-17 DIAGNOSIS — I214 Non-ST elevation (NSTEMI) myocardial infarction: Secondary | ICD-10-CM

## 2017-05-17 DIAGNOSIS — Z955 Presence of coronary angioplasty implant and graft: Secondary | ICD-10-CM

## 2017-05-17 LAB — CMP14+EGFR
ALBUMIN: 4 g/dL (ref 3.5–4.8)
ALT: 18 IU/L (ref 0–44)
AST: 16 IU/L (ref 0–40)
Albumin/Globulin Ratio: 1.7 (ref 1.2–2.2)
Alkaline Phosphatase: 114 IU/L (ref 39–117)
BUN / CREAT RATIO: 12 (ref 10–24)
BUN: 12 mg/dL (ref 8–27)
Bilirubin Total: 0.4 mg/dL (ref 0.0–1.2)
CO2: 27 mmol/L (ref 18–29)
CREATININE: 1.01 mg/dL (ref 0.76–1.27)
Calcium: 9.1 mg/dL (ref 8.6–10.2)
Chloride: 104 mmol/L (ref 96–106)
GFR calc non Af Amer: 73 mL/min/{1.73_m2} (ref >59)
GFR, EST AFRICAN AMERICAN: 85 mL/min/{1.73_m2} (ref >59)
GLOBULIN, TOTAL: 2.4 g/dL (ref 1.5–4.5)
GLUCOSE: 96 mg/dL (ref 65–99)
Potassium: 4.2 mmol/L (ref 3.5–5.2)
SODIUM: 143 mmol/L (ref 134–144)
TOTAL PROTEIN: 6.4 g/dL (ref 6.0–8.5)

## 2017-05-17 LAB — CBC WITH DIFFERENTIAL/PLATELET
BASOS ABS: 0 10*3/uL (ref 0.0–0.2)
Basos: 1 %
EOS (ABSOLUTE): 0.2 10*3/uL (ref 0.0–0.4)
EOS: 3 %
HEMOGLOBIN: 11.9 g/dL — AB (ref 13.0–17.7)
Hematocrit: 38.3 % (ref 37.5–51.0)
IMMATURE GRANS (ABS): 0 10*3/uL (ref 0.0–0.1)
Immature Granulocytes: 0 %
LYMPHS ABS: 1.7 10*3/uL (ref 0.7–3.1)
Lymphs: 27 %
MCH: 27.2 pg (ref 26.6–33.0)
MCHC: 31.1 g/dL — ABNORMAL LOW (ref 31.5–35.7)
MCV: 87 fL (ref 79–97)
MONOCYTES: 5 %
Monocytes Absolute: 0.3 10*3/uL (ref 0.1–0.9)
NEUTROS ABS: 4.1 10*3/uL (ref 1.4–7.0)
Neutrophils: 64 %
Platelets: 259 10*3/uL (ref 150–379)
RBC: 4.38 x10E6/uL (ref 4.14–5.80)
RDW: 13 % (ref 12.3–15.4)
WBC: 6.3 10*3/uL (ref 3.4–10.8)

## 2017-05-19 ENCOUNTER — Encounter (HOSPITAL_COMMUNITY)
Admission: RE | Admit: 2017-05-19 | Discharge: 2017-05-19 | Disposition: A | Discharge status: Home / Self Care | Payer: Medicare HMO | Source: Ambulatory Visit | Attending: Cardiovascular Disease | Admitting: Cardiovascular Disease

## 2017-05-19 DIAGNOSIS — Z955 Presence of coronary angioplasty implant and graft: Secondary | ICD-10-CM

## 2017-05-19 DIAGNOSIS — I214 Non-ST elevation (NSTEMI) myocardial infarction: Secondary | ICD-10-CM

## 2017-05-22 ENCOUNTER — Encounter (HOSPITAL_COMMUNITY)
Admission: RE | Admit: 2017-05-22 | Discharge: 2017-05-22 | Disposition: A | Discharge status: Home / Self Care | Payer: Medicare HMO | Source: Ambulatory Visit | Attending: Cardiovascular Disease | Admitting: Cardiovascular Disease

## 2017-05-22 DIAGNOSIS — Z955 Presence of coronary angioplasty implant and graft: Secondary | ICD-10-CM

## 2017-05-22 DIAGNOSIS — I214 Non-ST elevation (NSTEMI) myocardial infarction: Secondary | ICD-10-CM

## 2017-05-24 ENCOUNTER — Encounter (HOSPITAL_COMMUNITY)
Admission: RE | Admit: 2017-05-24 | Discharge: 2017-05-24 | Disposition: A | Discharge status: Home / Self Care | Payer: Medicare HMO | Source: Ambulatory Visit | Attending: Cardiovascular Disease | Admitting: Cardiovascular Disease

## 2017-05-24 DIAGNOSIS — I214 Non-ST elevation (NSTEMI) myocardial infarction: Secondary | ICD-10-CM | POA: Diagnosis not present

## 2017-05-24 DIAGNOSIS — Z955 Presence of coronary angioplasty implant and graft: Secondary | ICD-10-CM

## 2017-05-26 ENCOUNTER — Encounter (HOSPITAL_COMMUNITY)
Admission: RE | Admit: 2017-05-26 | Discharge: 2017-05-26 | Disposition: A | Discharge status: Home / Self Care | Payer: Medicare HMO | Source: Ambulatory Visit | Attending: Cardiovascular Disease | Admitting: Cardiovascular Disease

## 2017-05-26 DIAGNOSIS — I214 Non-ST elevation (NSTEMI) myocardial infarction: Secondary | ICD-10-CM | POA: Diagnosis not present

## 2017-05-26 DIAGNOSIS — Z955 Presence of coronary angioplasty implant and graft: Secondary | ICD-10-CM

## 2017-05-29 ENCOUNTER — Encounter (HOSPITAL_COMMUNITY)
Admission: RE | Admit: 2017-05-29 | Discharge: 2017-05-29 | Disposition: A | Discharge status: Home / Self Care | Payer: Medicare HMO | Source: Ambulatory Visit | Attending: Cardiovascular Disease | Admitting: Cardiovascular Disease

## 2017-05-29 DIAGNOSIS — I214 Non-ST elevation (NSTEMI) myocardial infarction: Secondary | ICD-10-CM | POA: Diagnosis not present

## 2017-05-29 DIAGNOSIS — Z955 Presence of coronary angioplasty implant and graft: Secondary | ICD-10-CM

## 2017-05-31 ENCOUNTER — Encounter (HOSPITAL_COMMUNITY)
Admission: RE | Admit: 2017-05-31 | Discharge: 2017-05-31 | Disposition: A | Discharge status: Home / Self Care | Payer: Medicare HMO | Source: Ambulatory Visit | Attending: Cardiovascular Disease | Admitting: Cardiovascular Disease

## 2017-05-31 ENCOUNTER — Encounter (HOSPITAL_COMMUNITY): Payer: Self-pay

## 2017-05-31 VITALS — Ht 64.25 in | Wt 148.1 lb

## 2017-05-31 DIAGNOSIS — I214 Non-ST elevation (NSTEMI) myocardial infarction: Secondary | ICD-10-CM | POA: Diagnosis not present

## 2017-05-31 DIAGNOSIS — Z955 Presence of coronary angioplasty implant and graft: Secondary | ICD-10-CM

## 2017-05-31 NOTE — Progress Notes (Signed)
Pt c/o abdominal discomfort off and on for past few weeks. Pt has been evaluated by his PCP.  PCP recommended pt keep journal of symptoms and triggers. Pt has not been able to keep the list.  Pt assisted with writing down information in a notebook for him to take to the doctor appt.  Dates and pt verbalized symptoms/triggers recorded for pt.  Per pt recall, most episodes of abdominal discomfort occurred when he was triggered by stressful event or episode, primarily times when he feels out of control of situation or that others are taking advantage of him.  appt made for pt to f/u with PCP Thursday 06/01/2017 10:00am.  Written and verbal instructions given. Pt verbalized understanding.

## 2017-05-31 NOTE — Progress Notes (Signed)
Cardiac Individual Treatment Plan  Patient Details  Name: Dale [name] MRN: [id number] Date of Birth: 1941/04/22 Referring Provider:     CARDIAC REHAB PHASE II ORIENTATION from 02/28/2017 in [facility] CARDIAC [facility]  Referring Provider  Andree Coss MD      Initial Encounter Date:    CARDIAC REHAB PHASE II ORIENTATION from 02/28/2017 in [facility] CARDIAC REHAB  Date  02/28/17  Referring Provider  Andree Coss MD      Visit Diagnosis: 12/30/16 NSTEMI (non-ST elevated myocardial infarction) (HCC)  12/30/16 Status post coronary artery stent placement  Patient's Home Medications on Admission:  Current Outpatient Prescriptions:  .  aspirin 81 MG chewable tablet, Chew 1 tablet (81 mg total) by mouth daily., Disp: 30 tablet, Rfl: 3 .  donepezil (ARICEPT) 10 MG tablet, Take 1 tablet (10 mg total) by mouth at bedtime., Disp: 90 tablet, Rfl: 4 .  memantine (NAMENDA) 10 MG tablet, Take 1 tablet (10 mg total) by mouth 2 (two) times daily., Disp: 180 tablet, Rfl: 4 .  nitroGLYCERIN (NITROSTAT) 0.4 MG SL tablet, Place 1 tablet (0.4 mg total) under the tongue every 5 (five) minutes x 3 doses as needed for chest pain., Disp: 25 tablet, Rfl: 12 .  pantoprazole (PROTONIX) 40 MG tablet, Take 1 tablet (40 mg total) by mouth daily., Disp: 30 tablet, Rfl: 11 .  pravastatin (PRAVACHOL) 40 MG tablet, TAKE 1 TABLET(40 MG) BY MOUTH DAILY, Disp: 90 tablet, Rfl: 5 .  tamsulosin (FLOMAX) 0.4 MG CAPS capsule, TAKE 1 CAPSULE(0.4 MG) BY MOUTH DAILY AFTER SUPPER, Disp: 90 capsule, Rfl: 2 .  ticagrelor (BRILINTA) 90 MG TABS tablet, Take 1 tablet (90 mg total) by mouth 2 (two) times daily., Disp: 180 tablet, Rfl: 3  Past Medical History: Past Medical History:  Diagnosis Date  . Coronary artery disease   . Hyperlipemia   . Leg mass    rt  . Memory loss   . Myocardial infarction less than 4 weeks ago (HCC) 12/29/2016  . Prostate disease   . S/P angioplasty with stent to  LCX     Tobacco Use: History  Smoking Status  . Former Smoker  . Packs/day: 1.00  . Years: 20.00  . Types: Cigarettes  . Quit date: 10/23/2011  Smokeless Tobacco  . Never Used    Labs: Recent Review Flowsheet Data    Labs for ITP Cardiac and Pulmonary Rehab Latest Ref Rng & Units 11/20/2008 05/27/2013 08/07/2014 12/31/2016   Cholestrol 0 - 200 mg/dL [phone]   LDLCALC 0 - 99 mg/dL 956(O) 130(Q) 657(Q) 51   HDL >40 mg/dL 60 62 65 60   Trlycerides <150 mg/dL 76 59 43 41   Hemoglobin A1c <5.7 % - - 5.3 -      Capillary Blood Glucose: No results found for: GLUCAP   Exercise Target Goals:    Exercise Program Goal: Individual exercise prescription set with THRR, safety & activity barriers. Participant demonstrates ability to understand and report RPE using BORG scale, to self-measure pulse accurately, and to acknowledge the importance of the exercise prescription.  Exercise Prescription Goal: Starting with aerobic activity 30 plus minutes a day, 3 days per week for initial exercise prescription. Provide home exercise prescription and guidelines that participant acknowledges understanding prior to discharge.  Activity Barriers & Risk Stratification:     Activity Barriers & Cardiac Risk Stratification - 02/28/17 0859      Activity Barriers & Cardiac Risk Stratification   Activity  Barriers Deconditioning;Muscular Weakness;Other (comment)   Comments L shoulder pain   Cardiac Risk Stratification High      6 Minute Walk:     6 Minute Walk    Row Name 02/28/17 1346         6 Minute Walk   Phase Initial     Distance 2133 feet     Walk Time 6 minutes     # of Rest Breaks 0     MPH 4.04     METS 4.53     RPE 12     VO2 Peak 15.86     Symptoms Yes (comment)     Comments leg fatigue at end of walk test     Resting HR 52 bpm     Resting BP 122/68     Max Ex. HR 109 bpm     Max Ex. BP 154/78     2 Minute Post BP 114/57        Oxygen Initial  Assessment:   Oxygen Re-Evaluation:   Oxygen Discharge (Final Oxygen Re-Evaluation):   Initial Exercise Prescription:     Initial Exercise Prescription - 02/28/17 1400      Date of Initial Exercise RX and Referring Provider   Date 02/28/17   Referring Provider Andree Coss MD     Bike   Level 0.7   Minutes 10   METs 2.89     NuStep   Level 3   SPM 80   Minutes 10   METs 2.4     Track   Laps 13   Minutes 10   METs 3.26     Prescription Details   Frequency (times per week) 3   Duration Progress to 30 minutes of continuous aerobic without signs/symptoms of physical distress     Intensity   THRR 40-80% of Max Heartrate 59-118   Ratings of Perceived Exertion 11-13   Perceived Dyspnea 0-4     Progression   Progression Continue progressive overload as per policy without signs/symptoms or physical distress.     Resistance Training   Training Prescription Yes   Weight 3lbs   Reps 10-15      Perform Capillary Blood Glucose checks as needed.  Exercise Prescription Changes:     Exercise Prescription Changes    Row Name 03/07/17 1600 03/23/17 1600 04/04/17 1500 04/19/17 1000 05/02/17 1400     Response to Exercise   Blood Pressure (Admit) 122/70 102/68 128/60 138/64 128/84   Blood Pressure (Exercise) 140/84 118/70 140/84 130/62 120/78   Blood Pressure (Exit) 102/60 110/64 138/70 120/80 124/68   Heart Rate (Admit) 59 bpm 82 bpm 78 bpm 75 bpm 90 bpm   Heart Rate (Exercise) 111 bpm 97 bpm 121 bpm 112 bpm 113 bpm   Heart Rate (Exit) 59 bpm 63 bpm 66 bpm 60 bpm 73 bpm   Rating of Perceived Exertion (Exercise) 11 13 13 12 11    Symptoms none none none none none   Duration Continue with 30 min of aerobic exercise without signs/symptoms of physical distress. Continue with 30 min of aerobic exercise without signs/symptoms of physical distress. Continue with 30 min of aerobic exercise without signs/symptoms of physical distress. Continue with 30 min of aerobic exercise  without signs/symptoms of physical distress. Continue with 30 min of aerobic exercise without signs/symptoms of physical distress.   Intensity THRR unchanged THRR unchanged THRR unchanged THRR unchanged THRR unchanged     Progression   Progression  - Continue to progress workloads  to maintain intensity without signs/symptoms of physical distress. Continue to progress workloads to maintain intensity without signs/symptoms of physical distress. Continue to progress workloads to maintain intensity without signs/symptoms of physical distress. Continue to progress workloads to maintain intensity without signs/symptoms of physical distress.   Average METs  - 2.3 3.3 3.1 3.6     Resistance Training   Training Prescription Yes Yes Yes Yes Yes   Weight 2lbs 5lbs 3lbs 3lbs 5lbs   Reps 10-15 10-15 10-15 10-15 10-15   Time 10 Minutes 10 Minutes 10 Minutes 10 Minutes 10 Minutes     Bike   Level 0.7  - 1  - 1   Minutes 10  - 15  - 15   METs 15  - 3.83  - 3.83     NuStep   Level 3 3  - 3  -   SPM 80 80  - 90  -   Minutes 15 15  - 15  -   METs 1.7 1.7  - 3.3  -     Track   Laps  - 16 16 16 20    Minutes  - 15 15 15 15    METs  - 2.86 2.86 2.86 3.32     Home Exercise Plan   Plans to continue exercise at  - Home (comment)  walking Home (comment)  walking Home (comment)  walking Home (comment)  walking   Frequency  - Add 2 additional days to program exercise sessions. Add 2 additional days to program exercise sessions. Add 2 additional days to program exercise sessions. Add 2 additional days to program exercise sessions.   Initial Home Exercises Provided  - 03/17/17 03/17/17 03/17/17 03/17/17   Row Name 05/16/17 1600 05/31/17 1500           Response to Exercise   Blood Pressure (Admit) 126/74 138/80      Blood Pressure (Exercise) 120/60 154/84      Blood Pressure (Exit) 130/84 112/70      Heart Rate (Admit) 78 bpm 58 bpm      Heart Rate (Exercise) 117 bpm 118 bpm      Heart Rate (Exit) 78  bpm 62 bpm      Rating of Perceived Exertion (Exercise) 8 11      Symptoms none none      Duration Continue with 30 min of aerobic exercise without signs/symptoms of physical distress. Continue with 30 min of aerobic exercise without signs/symptoms of physical distress.      Intensity THRR unchanged THRR unchanged        Progression   Progression Continue to progress workloads to maintain intensity without signs/symptoms of physical distress. Continue to progress workloads to maintain intensity without signs/symptoms of physical distress.      Average METs 3.3 3.7        Resistance Training   Training Prescription Yes Yes      Weight 5lbs 5lbs      Reps 10-15 10-15      Time 10 Minutes 10 Minutes        Bike   Level 1 1      Minutes 15 15      METs 3.83 3.83        NuStep   Level  - 3      SPM  - 90      Minutes  - 15      METs  - 3.7        Track  Laps 16  -      Minutes 15  -      METs 2.86  -        Home Exercise Plan   Plans to continue exercise at Home (comment)  walking Home (comment)  walking      Frequency Add 2 additional days to program exercise sessions. Add 2 additional days to program exercise sessions.      Initial Home Exercises Provided 03/17/17 03/17/17         Exercise Comments:     Exercise Comments    Row Name 03/07/17 1624 03/17/17 1517 04/04/17 1554 05/02/17 1423 05/29/17 1608   Exercise Comments Pt completed first day of exercise. Pt was oriented to exercise equipment and handled exercise very well; will continue to monitor pt's progress with exercise. Reviewed individual home exercise guidelines with patient. Pt is agreeable to walking 1-2 days per week in addition to exercise at cardiac rehab. Will need reinforcement. Reviewed METs and goals. Pt is tolerating exercise well; will continue to monitor exercise progression/activity level. Reviewed METs and goals. Pt is tolerating exercise well; will continue to monitor exercise progression/activity  level. Reviewed METs and goals. Pt is tolerating exercise well; will continue to monitor exercise progression/activity level.   Row Name 05/31/17 1552           Exercise Comments Pt completed 36 sessions of cardiac rehab. Pt plans to walk at [place] shopping center (indoors/with walking partner), 3-4x/week for ~30 minutes. Expressed to pt the importance of exercise. Discussed temperature/emergency precautions. Pt verbalized understanding.           Exercise Goals and Review:     Exercise Goals    Row Name 02/28/17 0900             Exercise Goals   Increase Physical Activity Yes       Intervention Provide advice, education, support and counseling about physical activity/exercise needs.;Develop an individualized exercise prescription for aerobic and resistive training based on initial evaluation findings, risk stratification, comorbidities and participant's personal goals.       Expected Outcomes Achievement of increased cardiorespiratory fitness and enhanced flexibility, muscular endurance and strength shown through measurements of functional capacity and personal statement of participant.       Increase Strength and Stamina Yes       Intervention Provide advice, education, support and counseling about physical activity/exercise needs.;Develop an individualized exercise prescription for aerobic and resistive training based on initial evaluation findings, risk stratification, comorbidities and participant's personal goals.       Expected Outcomes Achievement of increased cardiorespiratory fitness and enhanced flexibility, muscular endurance and strength shown through measurements of functional capacity and personal statement of participant.          Exercise Goals Re-Evaluation :     Exercise Goals Re-Evaluation    Row Name 04/04/17 1554 04/04/17 1555 04/04/17 1557 05/02/17 1423 05/02/17 1425     Exercise Goal Re-Evaluation   Exercise Goals Review Increase Physical Activity;Increase  Strenth and Stamina  -  - Increase Physical Activity;Increase Strenth and Stamina  -   Comments  - Pt is back to cutting grass with riding lawn mower. Discussed temperature and emergency precautions, pt verbalized understanding. May need to be reinforced Pt is back to cutting grass with riding lawn mower. Discussed temperature and emergency precautions, pt verbalized understanding. May need to be reinforced/reviewed.  Pt has increased walking tolerance. Pt also noticed an increase in energy levels at home when doing ADL's  Pt has increased walking tolerance from 14 laps to 20 laps. Pt also noticed an increase in energy levels at home when doing ADL's   Expected Outcomes  - Disc Pt will continue to build on aerobic capacity, functional activities, and overall well-being  - Pt will continue to build on aerobic capacity, functional activities, and overall well-being   Row Name 05/29/17 1614 05/29/17 1629 05/29/17 1630         Exercise Goal Re-Evaluation   Exercise Goals Review  - Increase Strenth and Stamina;Increase Physical Activity  -     Comments  -  - Pt has no complaints with exercise and has returned to all activities at home.     Expected Outcomes oooohu  - Pt will continue to improve in cardiorespiratory fitness and functional activities         Discharge Exercise Prescription (Final Exercise Prescription Changes):     Exercise Prescription Changes - 05/31/17 1500      Response to Exercise   Blood Pressure (Admit) 138/80   Blood Pressure (Exercise) 154/84   Blood Pressure (Exit) 112/70   Heart Rate (Admit) 58 bpm   Heart Rate (Exercise) 118 bpm   Heart Rate (Exit) 62 bpm   Rating of Perceived Exertion (Exercise) 11   Symptoms none   Duration Continue with 30 min of aerobic exercise without signs/symptoms of physical distress.   Intensity THRR unchanged     Progression   Progression Continue to progress workloads to maintain intensity without signs/symptoms of physical distress.    Average METs 3.7     Resistance Training   Training Prescription Yes   Weight 5lbs   Reps 10-15   Time 10 Minutes     Bike   Level 1   Minutes 15   METs 3.83     NuStep   Level 3   SPM 90   Minutes 15   METs 3.7     Home Exercise Plan   Plans to continue exercise at Home (comment)  walking   Frequency Add 2 additional days to program exercise sessions.   Initial Home Exercises Provided 03/17/17      Nutrition:  Target Goals: Understanding of nutrition guidelines, daily intake of sodium 1500mg , cholesterol 200mg , calories 30% from fat and 7% or less from saturated fats, daily to have 5 or more servings of fruits and vegetables.  Biometrics:     Pre Biometrics - 02/28/17 1349      Pre Biometrics   Waist Circumference 35 inches   Hip Circumference 39 inches   Waist to Hip Ratio 0.9 %   Triceps Skinfold 15 mm   % Body Fat 24.8 %   Grip Strength 34 kg   Flexibility 12 in   Single Leg Stand 4 seconds       Nutrition Therapy Plan and Nutrition Goals:     Nutrition Therapy & Goals - 03/29/17 1418      Nutrition Therapy   Diet Therapeutic Lifestyle Changes     Personal Nutrition Goals   Nutrition Goal Pt to maintain his wt while in Cardiac Rehab     Intervention Plan   Intervention Prescribe, educate and counsel regarding individualized specific dietary modifications aiming towards targeted core components such as weight, hypertension, lipid management, diabetes, heart failure and other comorbidities.   Expected Outcomes Short Term Goal: Understand basic principles of dietary content, such as calories, fat, sodium, cholesterol and nutrients.;Long Term Goal: Adherence to prescribed nutrition plan.  Nutrition Discharge: Nutrition Scores:     Nutrition Assessments - 03/29/17 1418      MEDFICTS Scores   Pre Score 27      Nutrition Goals Re-Evaluation:   Nutrition Goals Re-Evaluation:   Nutrition Goals Discharge (Final Nutrition Goals  Re-Evaluation):   Psychosocial: Target Goals: Acknowledge presence or absence of significant depression and/or stress, maximize coping skills, provide positive support system. Participant is able to verbalize types and ability to use techniques and skills needed for reducing stress and depression.  Initial Review & Psychosocial Screening:     Initial Psych Review & Screening - 02/28/17 1615      Initial Review   Current issues with None Identified     Family Dynamics   Good Support System? Yes     Barriers   Psychosocial barriers to participate in program There are no identifiable barriers or psychosocial needs.     Screening Interventions   Interventions Encouraged to exercise      Quality of Life Scores:     Quality of Life - 03/15/17 1535      Quality of Life Scores   Health/Function Pre 26 %  overall scores satisfactory. pt has concerns about his overall health with mild cardiac symptoms currently.     Socioeconomic Pre 29.25 %   Psych/Spiritual Pre 28.29 %   Family Pre 28.8 %   GLOBAL Pre 27.6 %      PHQ-9: Recent Review Flowsheet Data    Depression screen [facility] 2/9 05/31/2017 05/16/2017 04/05/2017 03/06/2017 12/27/2016   Decreased Interest 0 0 0 0 0   Down, Depressed, Hopeless 0 0 0 0 0   PHQ - 2 Score 0 0 0 0 0     Interpretation of Total Score  Total Score Depression Severity:  1-4 = Minimal depression, 5-9 = Mild depression, 10-14 = Moderate depression, 15-19 = Moderately severe depression, 20-27 = Severe depression   Psychosocial Evaluation and Intervention:     Psychosocial Evaluation - 03/06/17 1612      Psychosocial Evaluation & Interventions   Interventions Stress management education;Relaxation education;Encouraged to exercise with the program and follow exercise prescription   Comments pt with memory deficit treated by neuro.   pt son and daughter assist patient although he lives alone. pt does admit to anger associated with the role reversals.      Expected Outcomes pt will demonstrate positive outlook with good coping skills.     Continue Psychosocial Services  Follow up required by staff      Psychosocial Re-Evaluation:     Psychosocial Re-Evaluation    Row Name 03/08/17 1650 04/04/17 1151 04/28/17 0739 05/31/17 1705       Psychosocial Re-Evaluation   Current issues with Current Stress Concerns;Current Anxiety/Panic Current Stress Concerns;Current Anxiety/Panic Current Stress Concerns;Current Anxiety/Panic Current Stress Concerns;Current Anxiety/Panic    Comments current dementia  current dementia, pt verbalizes improved relationship with his adult children.   current dementia, pt verbalizes improved relationship with his adult children.  pt demonstrates uplifted spirit and ease with current situation.  current dementia, pt verbalizes improved relationship with his adult children.  pt demonstrates uplifted spirit and ease with current situation. pt working his PCP on anxiety induced abdominal pain.      Expected Outcomes pt will exhibit good coping skills with positive outlook.   pt will exhibit good coping skills with positive outlook.   pt will exhibit good coping skills with positive outlook.   pt will exhibit good coping skills with  positive outlook.      Interventions Encouraged to attend Cardiac Rehabilitation for the exercise;Relaxation education;Stress management education Encouraged to attend Cardiac Rehabilitation for the exercise;Relaxation education;Stress management education Encouraged to attend Cardiac Rehabilitation for the exercise;Relaxation education;Stress management education Encouraged to attend Cardiac Rehabilitation for the exercise;Relaxation education;Stress management education    Continue Psychosocial Services  Follow up required by staff Follow up required by staff Follow up required by staff Follow up required by staff    Comments pt son and daughter are very involved in pt care and well being, although pt does  live alone.   -  -  -      Initial Review   Source of Stress Concerns Chronic Illness;Unable to perform yard/household activities;Unable to participate in former interests or hobbies  -  -  -       Psychosocial Discharge (Final Psychosocial Re-Evaluation):     Psychosocial Re-Evaluation - 05/31/17 1705      Psychosocial Re-Evaluation   Current issues with Current Stress Concerns;Current Anxiety/Panic   Comments current dementia, pt verbalizes improved relationship with his adult children.  pt demonstrates uplifted spirit and ease with current situation. pt working his PCP on anxiety induced abdominal pain.     Expected Outcomes pt will exhibit good coping skills with positive outlook.     Interventions Encouraged to attend Cardiac Rehabilitation for the exercise;Relaxation education;Stress management education   Continue Psychosocial Services  Follow up required by staff      Vocational Rehabilitation: Provide vocational rehab assistance to qualifying candidates.   Vocational Rehab Evaluation & Intervention:     Vocational Rehab - 03/15/17 1531      Initial Vocational Rehab Evaluation & Intervention   Assessment shows need for Vocational Rehabilitation No      Education: Education Goals: Education classes will be provided on a weekly basis, covering required topics. Participant will state understanding/return demonstration of topics presented.  Learning Barriers/Preferences:     Learning Barriers/Preferences - 02/28/17 8119      Learning Barriers/Preferences   Learning Barriers Exercise Concerns  memory deficit      Education Topics: Count Your Pulse:  -Group instruction provided by verbal instruction, demonstration, patient participation and written materials to support subject.  Instructors address importance of being able to find your pulse and how to count your pulse when at home without a heart monitor.  Patients get hands on experience counting their pulse with  staff help and individually.   CARDIAC REHAB PHASE II EXERCISE from 05/31/2017 in [facility] CARDIAC REHAB  Date  04/14/17  Instruction Review Code  2- meets goals/outcomes      Heart Attack, Angina, and Risk Factor Modification:  -Group instruction provided by verbal instruction, video, and written materials to support subject.  Instructors address signs and symptoms of angina and heart attacks.    Also discuss risk factors for heart disease and how to make changes to improve heart health risk factors.   Functional Fitness:  -Group instruction provided by verbal instruction, demonstration, patient participation, and written materials to support subject.  Instructors address safety measures for doing things around the house.  Discuss how to get up and down off the floor, how to pick things up properly, how to safely get out of a chair without assistance, and balance training.   CARDIAC REHAB PHASE II EXERCISE from 05/31/2017 in [facility] CARDIAC REHAB  Date  04/28/17  Educator  Cristy Hilts  Instruction Review Code  2-  meets goals/outcomes      Meditation and Mindfulness:  -Group instruction provided by verbal instruction, patient participation, and written materials to support subject.  Instructor addresses importance of mindfulness and meditation practice to help reduce stress and improve awareness.  Instructor also leads participants through a meditation exercise.    CARDIAC REHAB PHASE II EXERCISE from 05/31/2017 in [facility] CARDIAC REHAB  Date  05/17/17  Instruction Review Code  2- meets goals/outcomes      Stretching for Flexibility and Mobility:  -Group instruction provided by verbal instruction, patient participation, and written materials to support subject.  Instructors lead participants through series of stretches that are designed to increase flexibility thus improving mobility.  These stretches are additional exercise  for major muscle groups that are typically performed during regular warm up and cool down.   CARDIAC REHAB PHASE II EXERCISE from 05/31/2017 in [facility] CARDIAC REHAB  Date  04/07/17  Instruction Review Code  2- meets goals/outcomes      Hands Only CPR:  -Group verbal, video, and participation provides a basic overview of AHA guidelines for community CPR. Role-play of emergencies allow participants the opportunity to practice calling for help and chest compression technique with discussion of AED use.   Hypertension: -Group verbal and written instruction that provides a basic overview of hypertension including the most recent diagnostic guidelines, risk factor reduction with self-care instructions and medication management.   CARDIAC REHAB PHASE II EXERCISE from 05/31/2017 in [facility] CARDIAC REHAB  Date  05/19/17  Instruction Review Code  2- meets goals/outcomes       Nutrition I class: Heart Healthy Eating:  -Group instruction provided by PowerPoint slides, verbal discussion, and written materials to support subject matter. The instructor gives an explanation and review of the Therapeutic Lifestyle Changes diet recommendations, which includes a discussion on lipid goals, dietary fat, sodium, fiber, plant stanol/sterol esters, sugar, and the components of a well-balanced, healthy diet.   Nutrition II class: Lifestyle Skills:  -Group instruction provided by PowerPoint slides, verbal discussion, and written materials to support subject matter. The instructor gives an explanation and review of label reading, grocery shopping for heart health, heart healthy recipe modifications, and ways to make healthier choices when eating out.   Diabetes Question & Answer:  -Group instruction provided by PowerPoint slides, verbal discussion, and written materials to support subject matter. The instructor gives an explanation and review of diabetes co-morbidities,  pre- and post-prandial blood glucose goals, pre-exercise blood glucose goals, signs, symptoms, and treatment of hypoglycemia and hyperglycemia, and foot care basics.   Diabetes Blitz:  -Group instruction provided by PowerPoint slides, verbal discussion, and written materials to support subject matter. The instructor gives an explanation and review of the physiology behind type 1 and type 2 diabetes, diabetes medications and rational behind using different medications, pre- and post-prandial blood glucose recommendations and Hemoglobin A1c goals, diabetes diet, and exercise including blood glucose guidelines for exercising safely.    Portion Distortion:  -Group instruction provided by PowerPoint slides, verbal discussion, written materials, and food models to support subject matter. The instructor gives an explanation of serving size versus portion size, changes in portions sizes over the last 20 years, and what consists of a serving from each food group.   CARDIAC REHAB PHASE II EXERCISE from 05/31/2017 in [facility] CARDIAC REHAB  Date  05/31/17  Educator  RD  Instruction Review Code  2- meets goals/outcomes  Stress Management:  -Group instruction provided by verbal instruction, video, and written materials to support subject matter.  Instructors review role of stress in heart disease and how to cope with stress positively.     CARDIAC REHAB PHASE II EXERCISE from 05/31/2017 in [facility] CARDIAC REHAB  Date  04/12/17  Instruction Review Code  2- meets goals/outcomes      Exercising on Your Own:  -Group instruction provided by verbal instruction, power point, and written materials to support subject.  Instructors discuss benefits of exercise, components of exercise, frequency and intensity of exercise, and end points for exercise.  Also discuss use of nitroglycerin and activating EMS.  Review options of places to exercise outside of rehab.  Review  guidelines for sex with heart disease.   Cardiac Drugs I:  -Group instruction provided by verbal instruction and written materials to support subject.  Instructor reviews cardiac drug classes: antiplatelets, anticoagulants, beta blockers, and statins.  Instructor discusses reasons, side effects, and lifestyle considerations for each drug class.   CARDIAC REHAB PHASE II EXERCISE from 05/31/2017 in [facility] CARDIAC REHAB  Date  03/22/17  Educator  Pharmacist  Instruction Review Code  2- meets goals/outcomes      Cardiac Drugs II:  -Group instruction provided by verbal instruction and written materials to support subject.  Instructor reviews cardiac drug classes: angiotensin converting enzyme inhibitors (ACE-I), angiotensin II receptor blockers (ARBs), nitrates, and calcium channel blockers.  Instructor discusses reasons, side effects, and lifestyle considerations for each drug class.   CARDIAC REHAB PHASE II EXERCISE from 05/31/2017 in [facility] CARDIAC REHAB  Date  04/19/17  Instruction Review Code  2- meets goals/outcomes      Anatomy and Physiology of the Circulatory System:  Group verbal and written instruction and models provide basic cardiac anatomy and physiology, with the coronary electrical and arterial systems. Review of: AMI, Angina, Valve disease, Heart Failure, Peripheral Artery Disease, Cardiac Arrhythmia, Pacemakers, and the ICD.   CARDIAC REHAB PHASE II EXERCISE from 05/31/2017 in [facility] CARDIAC REHAB  Date  03/15/17  Instruction Review Code  2- meets goals/outcomes      Other Education:  -Group or individual verbal, written, or video instructions that support the educational goals of the cardiac rehab program.   Knowledge Questionnaire Score:     Knowledge Questionnaire Score - 03/13/17 1511      Knowledge Questionnaire Score   Pre Score 19/24      Core Components/Risk Factors/Patient Goals at  Admission:     Personal Goals and Risk Factors at Admission - 02/28/17 0901      Core Components/Risk Factors/Patient Goals on Admission   Lipids Yes   Intervention Provide education and support for participant on nutrition & aerobic/resistive exercise along with prescribed medications to achieve LDL 70mg , HDL >40mg .   Expected Outcomes Short Term: Participant states understanding of desired cholesterol values and is compliant with medications prescribed. Participant is following exercise prescription and nutrition guidelines.;Long Term: Cholesterol controlled with medications as prescribed, with individualized exercise RX and with personalized nutrition plan. Value goals: LDL < 70mg , HDL > 40 mg.      Core Components/Risk Factors/Patient Goals Review:      Goals and Risk Factor Review    Row Name 03/08/17 1646 03/08/17 1647 04/04/17 1150 04/28/17 0738       Core Components/Risk Factors/Patient Goals Review   Personal Goals Review Lipids;Other  - Lipids;Other  -    Review  -  pt able to participate in group exercise setting without difficulty. pt is integrating well with equipment changes and routine with assistance of staff/volunteers.   pt able to participate in group exercise setting without difficulty. pt is integrating well with equipment changes and routine with assistance of staff/volunteers.  pt reports he has noticed increased strength/stamina and is pleased to be able to resume some of his usual home routine.  pt eats small frequent meals, and notices he feels weaker when he has forgotten to eat.  pt able to participate in group exercise setting without difficulty. pt is integrating well with equipment changes and routine without assistance.  pt reports he has noticed increased strength/stamina and is pleased to be able to resume some of his usual home and work routine.       Expected Outcomes  - pt will participate in CR for exercise, nutrition and risk factor education to make  lifestyle modifications to decrease CAD progression and overall wellbeing.  pt will participate in CR for exercise, nutrition and risk factor education to make lifestyle modifications to decrease CAD progression and overall wellbeing.  pt will participate in CR for exercise, nutrition and risk factor education to make lifestyle modifications to decrease CAD progression and overall wellbeing.        Core Components/Risk Factors/Patient Goals at Discharge (Final Review):      Goals and Risk Factor Review - 04/28/17 0738      Core Components/Risk Factors/Patient Goals Review   Review pt able to participate in group exercise setting without difficulty. pt is integrating well with equipment changes and routine without assistance.  pt reports he has noticed increased strength/stamina and is pleased to be able to resume some of his usual home and work routine.      Expected Outcomes pt will participate in CR for exercise, nutrition and risk factor education to make lifestyle modifications to decrease CAD progression and overall wellbeing.       ITP Comments:     ITP Comments    Row Name 02/28/17 0856 05/30/17 1656         ITP Comments Dr. Armanda Magic, Medical Director Dr. Armanda Magic, Medical Director         Comments: Pt completed program today with 36 sessions. Recommend continued exercise and life style modification education including  stress management and relaxation techniques to decrease cardiac risk profile.

## 2017-06-01 ENCOUNTER — Ambulatory Visit (INDEPENDENT_AMBULATORY_CARE_PROVIDER_SITE_OTHER): Payer: Medicare HMO | Admitting: Family Medicine

## 2017-06-01 ENCOUNTER — Telehealth: Payer: Self-pay | Admitting: Family Medicine

## 2017-06-01 DIAGNOSIS — K219 Gastro-esophageal reflux disease without esophagitis: Secondary | ICD-10-CM | POA: Diagnosis not present

## 2017-06-01 MED ORDER — CALCIUM CARBONATE ANTACID 500 MG PO CHEW: 1.0000 | CHEWABLE_TABLET | Freq: Three times a day (TID) | ORAL | Status: DC | PRN

## 2017-06-01 NOTE — Patient Instructions (Signed)
It was a pleasure seeing you today in our clinic. Today we discussed your abdominal pain. Here is the treatment plan we have discussed and agreed upon together:   - Continue taking all of your medications as prescribed. - During these episodes in which you have "flares" of abdominal pain I would like for you to take over-the-counter Tums. Take this as directed on the packaging. - If you notice any development of constipation while taking Tums I would strongly encourage you to decrease your overall consumption. - Come back and see your primary care doctor in one month for reevaluation.

## 2017-06-01 NOTE — Telephone Encounter (Signed)
Left message for patient to call to schedule her f/u appt. from today's visit, 06/01/17/ls

## 2017-06-01 NOTE — Progress Notes (Signed)
   HPI  CC: Abdominal pain, upper quadrants Patient is here for follow-up with his abdominal pain. He states that he has had no change in his abdominal pain since he was last seen. Abdominal pain is located in his upper right and left quadrants. He states that the pain is not constant. No association with food. He describes this pain as burning. Patient states that his pain gets worse during times of stress. These symptoms will last as long as it takes for him to "get my mind off of it". Patient is unable to identify any other things that make this abdominal pain develop.  Patient states that he has not had any fevers, chills, vomiting, diarrhea, or constipation. He typically has 1-2 movements a day. He denies any hematochezia. He endorses melena in the past, but states that this has not happened in over a year.  CC, SH/smoking status, and VS noted  Objective: BP 118/60   Pulse (!) 53   Temp 98.2 F (36.8 C) (Oral)   Wt 151 lb (68.5 kg)   SpO2 98%   BMI 25.52 kg/m  Gen: NAD, alert, cooperative, and pleasant. CV: RRR, no murmur Resp: CTAB, no wheezes, non-labored Abd: SNTND, BS present, no guarding or organomegaly Ext: No edema, warm Neuro: Alert and oriented, Speech clear, No gross deficits   Assessment and plan:  GERD (gastroesophageal reflux disease) Patient is here with symptoms consistent with GERD. There seems to be in association with stress and anxiety. Symptoms are short living and resolved with the resolution of his stressor. We discussed at length the possible causes for these symptoms as well as possible treatments. Patient endorses good compliance with all of his medications at this time including his PPI. - Strongly encouraged PRN use of OTC antacids (Tums) - Considered increasing pantoprazole, but opted against this due to the episodic nature of his symptoms. - Discussed the possibility of SSRI initiation; patient and son (present today) were not interested in this  therapy at this time. Has a provider I agree with this decision, however future consideration may be appropriate if symptoms persist or worsen. - Follow-up in 4 weeks   Meds ordered this encounter  Medications  . calcium carbonate (TUMS) 500 MG chewable tablet    Sig: Chew 1 tablet (200 mg of elemental calcium total) by mouth 3 (three) times daily as needed for indigestion or heartburn.     Kathee Delton, MD,MS,  PGY3 06/01/2017 5:17 PM

## 2017-06-01 NOTE — Assessment & Plan Note (Signed)
Patient is here with symptoms consistent with GERD. There seems to be in association with stress and anxiety. Symptoms are short living and resolved with the resolution of his stressor. We discussed at length the possible causes for these symptoms as well as possible treatments. Patient endorses good compliance with all of his medications at this time including his PPI. - Strongly encouraged PRN use of OTC antacids (Tums) - Considered increasing pantoprazole, but opted against this due to the episodic nature of his symptoms. - Discussed the possibility of SSRI initiation; patient and son (present today) were not interested in this therapy at this time. Has a provider I agree with this decision, however future consideration may be appropriate if symptoms persist or worsen. - Follow-up in 4 weeks

## 2017-06-02 ENCOUNTER — Encounter (HOSPITAL_COMMUNITY): Admission: RE | Admit: 2017-06-02 | Payer: Medicare HMO | Source: Ambulatory Visit

## 2017-06-05 ENCOUNTER — Encounter (HOSPITAL_COMMUNITY): Payer: Medicare HMO

## 2017-06-07 ENCOUNTER — Encounter (HOSPITAL_COMMUNITY): Payer: Medicare HMO

## 2017-06-09 ENCOUNTER — Encounter (HOSPITAL_COMMUNITY): Payer: Medicare HMO

## 2017-06-12 ENCOUNTER — Encounter (HOSPITAL_COMMUNITY): Payer: Medicare HMO

## 2017-06-16 ENCOUNTER — Encounter (HOSPITAL_COMMUNITY): Payer: Medicare HMO

## 2017-06-19 ENCOUNTER — Encounter (HOSPITAL_COMMUNITY): Payer: Medicare HMO

## 2017-06-21 ENCOUNTER — Encounter (HOSPITAL_COMMUNITY): Payer: Medicare HMO

## 2017-06-21 NOTE — Progress Notes (Signed)
Discharge Summary  Patient Details  Name: Dale [name] MRN: [id number] Date of Birth: [birth year] Referring Provider:     CARDIAC REHAB PHASE II ORIENTATION from 02/28/2017 in [facility] CARDIAC [facility]  Referring Provider  Andree Coss MD       Number of Visits: 36   Reason for Discharge:  Patient reached a stable level of exercise.  Pt did not return post homework assessments.   Smoking History:  History  Smoking Status  . Former Smoker  . Packs/day: 1.00  . Years: 20.00  . Types: Cigarettes  . Quit date: 10/23/2011  Smokeless Tobacco  . 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  ADL UCSD:   Initial Exercise Prescription:     Initial Exercise Prescription - 02/28/17 1400      Date of Initial Exercise RX and Referring Provider   Date 02/28/17   Referring Provider Andree Coss MD     Bike   Level 0.7   Minutes 10   METs 2.89     NuStep   Level 3   SPM 80   Minutes 10   METs 2.4     Track   Laps 13   Minutes 10   METs 3.26     Prescription Details   Frequency (times per week) 3   Duration Progress to 30 minutes of continuous aerobic without signs/symptoms of physical distress     Intensity   THRR 40-80% of Max Heartrate 59-118   Ratings of Perceived Exertion 11-13   Perceived Dyspnea 0-4     Progression   Progression Continue progressive overload as per policy without signs/symptoms or physical distress.     Resistance Training   Training Prescription Yes   Weight 3lbs   Reps 10-15      Discharge Exercise Prescription (Final Exercise Prescription Changes):     Exercise Prescription Changes - 05/31/17 1500      Response to Exercise   Blood Pressure (Admit) 138/80   Blood Pressure (Exercise) 154/84   Blood Pressure (Exit) 112/70   Heart Rate (Admit) 58 bpm   Heart Rate (Exercise) 118 bpm   Heart Rate (Exit) 62 bpm    Rating of Perceived Exertion (Exercise) 11   Symptoms none   Duration Continue with 30 min of aerobic exercise without signs/symptoms of physical distress.   Intensity THRR unchanged     Progression   Progression Continue to progress workloads to maintain intensity without signs/symptoms of physical distress.   Average METs 3.7     Resistance Training   Training Prescription Yes   Weight 5lbs   Reps 10-15   Time 10 Minutes     Bike   Level 1   Minutes 15   METs 3.83     NuStep   Level 3   SPM 90   Minutes 15   METs 3.7     Home Exercise Plan   Plans to continue exercise at Home (comment)  walking   Frequency Add 2 additional days to program exercise sessions.   Initial Home Exercises Provided 03/17/17      Functional Capacity:     6 Minute Walk    Row Name 02/28/17 1346 06/08/17 1514       6 Minute Walk   Phase Initial Discharge    Distance 2133 feet 1800 feet    Walk Time 6 minutes 6 minutes    #  of Rest Breaks 0 0    MPH 4.04 3.4    METS 4.53 3.68    RPE 12 12    VO2 Peak 15.86 12.87    Symptoms Yes (comment) No    Comments leg fatigue at end of walk test  -    Resting HR 52 bpm 72 bpm    Resting BP 122/68 150/60    Max Ex. HR 109 bpm 82 bpm    Max Ex. BP 154/78 160/66    2 Minute Post BP 114/57 122/60       Psychological, QOL, Others - Outcomes: PHQ 2/9: Depression screen [facility] 2/9 06/01/2017 05/31/2017 05/16/2017 04/05/2017 03/06/2017  Decreased Interest 0 0 0 0 0  Down, Depressed, Hopeless 0 0 0 0 0  PHQ - 2 Score 0 0 0 0 0    Quality of Life:     Quality of Life - 03/15/17 1535      Quality of Life Scores   Health/Function Pre 26 %  overall scores satisfactory. pt has concerns about his overall health with mild cardiac symptoms currently.     Socioeconomic Pre 29.25 %   Psych/Spiritual Pre 28.29 %   Family Pre 28.8 %   GLOBAL Pre 27.6 %      Personal Goals: Goals established at orientation with interventions provided to work toward  goal.     Personal Goals and Risk Factors at Admission - 02/28/17 0901      Core Components/Risk Factors/Patient Goals on Admission   Lipids Yes   Intervention Provide education and support for participant on nutrition & aerobic/resistive exercise along with prescribed medications to achieve LDL 70mg , HDL >40mg .   Expected Outcomes Short Term: Participant states understanding of desired cholesterol values and is compliant with medications prescribed. Participant is following exercise prescription and nutrition guidelines.;Long Term: Cholesterol controlled with medications as prescribed, with individualized exercise RX and with personalized nutrition plan. Value goals: LDL < 70mg , HDL > 40 mg.       Personal Goals Discharge:     Goals and Risk Factor Review    Row Name 03/08/17 1646 03/08/17 1647 04/04/17 1150 04/28/17 0738       Core Components/Risk Factors/Patient Goals Review   Personal Goals Review Lipids;Other  - Lipids;Other  -    Review  - pt able to participate in group exercise setting without difficulty. pt is integrating well with equipment changes and routine with assistance of staff/volunteers.   pt able to participate in group exercise setting without difficulty. pt is integrating well with equipment changes and routine with assistance of staff/volunteers.  pt reports he has noticed increased strength/stamina and is pleased to be able to resume some of his usual home routine.  pt eats small frequent meals, and notices he feels weaker when he has forgotten to eat.  pt able to participate in group exercise setting without difficulty. pt is integrating well with equipment changes and routine without assistance.  pt reports he has noticed increased strength/stamina and is pleased to be able to resume some of his usual home and work routine.       Expected Outcomes  - pt will participate in CR for exercise, nutrition and risk factor education to make lifestyle modifications to decrease  CAD progression and overall wellbeing.  pt will participate in CR for exercise, nutrition and risk factor education to make lifestyle modifications to decrease CAD progression and overall wellbeing.  pt will participate in CR for exercise, nutrition and risk  factor education to make lifestyle modifications to decrease CAD progression and overall wellbeing.        Nutrition & Weight - Outcomes:     Pre Biometrics - 06/08/17 1531      Pre Biometrics   Height 5' 4.25" (1.632 m)   Weight 148 lb 2.4 oz (67.2 kg)   Waist Circumference 33 inches   Hip Circumference 36 inches   Waist to Hip Ratio 0.92 %   BMI (Calculated) 25.3   Triceps Skinfold 15 mm   % Body Fat 23.9 %   Grip Strength 38 kg   Flexibility 9 in   Single Leg Stand 11 seconds       Nutrition:     Nutrition Therapy & Goals - 03/29/17 1418      Nutrition Therapy   Diet Therapeutic Lifestyle Changes     Personal Nutrition Goals   Nutrition Goal Pt to maintain his wt while in Cardiac Rehab     Intervention Plan   Intervention Prescribe, educate and counsel regarding individualized specific dietary modifications aiming towards targeted core components such as weight, hypertension, lipid management, diabetes, heart failure and other comorbidities.   Expected Outcomes Short Term Goal: Understand basic principles of dietary content, such as calories, fat, sodium, cholesterol and nutrients.;Long Term Goal: Adherence to prescribed nutrition plan.      Nutrition Discharge:     Nutrition Assessments - 03/29/17 1418      MEDFICTS Scores   Pre Score 27      Education Questionnaire Score:     Knowledge Questionnaire Score - 06/08/17 1537      Knowledge Questionnaire Score   Post Score --  post knowledge test or QOL scores were not reviewed due to pt not returning homework      Goals reviewed with patient; copy given to patient.

## 2017-06-23 ENCOUNTER — Encounter (HOSPITAL_COMMUNITY): Payer: Medicare HMO

## 2017-06-26 ENCOUNTER — Encounter (HOSPITAL_COMMUNITY): Payer: Medicare HMO

## 2017-06-28 ENCOUNTER — Encounter (HOSPITAL_COMMUNITY): Payer: Medicare HMO

## 2017-06-30 ENCOUNTER — Encounter (HOSPITAL_COMMUNITY): Payer: Medicare HMO

## 2017-07-03 ENCOUNTER — Encounter (HOSPITAL_COMMUNITY): Payer: Medicare HMO

## 2017-07-11 DIAGNOSIS — Z0100 Encounter for examination of eyes and vision without abnormal findings: Secondary | ICD-10-CM | POA: Diagnosis not present

## 2017-07-17 DIAGNOSIS — H2513 Age-related nuclear cataract, bilateral: Secondary | ICD-10-CM | POA: Diagnosis not present

## 2017-08-16 ENCOUNTER — Encounter: Payer: Self-pay | Admitting: Family Medicine

## 2017-08-16 ENCOUNTER — Ambulatory Visit (INDEPENDENT_AMBULATORY_CARE_PROVIDER_SITE_OTHER): Payer: Medicare HMO | Admitting: Family Medicine

## 2017-08-16 DIAGNOSIS — M79651 Pain in right thigh: Secondary | ICD-10-CM

## 2017-08-16 DIAGNOSIS — G3184 Mild cognitive impairment, so stated: Secondary | ICD-10-CM | POA: Diagnosis not present

## 2017-08-16 DIAGNOSIS — Z23 Encounter for immunization: Secondary | ICD-10-CM | POA: Diagnosis not present

## 2017-08-16 DIAGNOSIS — M79652 Pain in left thigh: Secondary | ICD-10-CM

## 2017-08-16 DIAGNOSIS — M79659 Pain in unspecified thigh: Secondary | ICD-10-CM | POA: Insufficient documentation

## 2017-08-16 NOTE — Progress Notes (Addendum)
Subjective  Patient is presenting with the following illnesses  PAIN IN BOTH LEGS Complains of pain in the back of both thighs especially when he wakes up and starts to move.  No injury or weakness or incontinence. Seemed to be better when he was doing rehab.   Not taking any medications.  No pain in his shoulders or upper arms   COGNITIVE IMPAIRMENT He thinks he is doing ok.  Lives alone but his daughter [name] checks on him daily.  He forgot to bring his medications and is not sure of the names.  No focal weakness or vision or hearing problems    Chief Complaint noted Review of Symptoms - see HPI PMH - Smoking status noted.     Objective Vital Signs reviewed Alert nad Able to walk on heels and toes and do deep knee bend with mild pain in posterior thigh.  Does not radiate.   No pain to palpation in his back No pain with range of motion of hip or knee or ankles  Normal joint exams     Assessments/Plans  Thigh pain Unsure of cause but seems to be related to mild sciatica or muscle stiffness.  No signs of nerve impingement or joint arthritis.  See after visit summary.  May need imaging if persists   Mild cognitive impairment Seems stable but needs further assessment. See after visit summary Asked to come in for AWV.  Hopefully will bring his medications

## 2017-08-16 NOTE — Assessment & Plan Note (Signed)
Unsure of cause but seems to be related to mild sciatica or muscle stiffness.  No signs of nerve impingement or joint arthritis.  See after visit summary.  May need imaging if persists

## 2017-08-16 NOTE — Assessment & Plan Note (Signed)
Seems stable but needs further assessment. See after visit summary Asked to come in for AWV.  Hopefully will bring his medications

## 2017-08-16 NOTE — Patient Instructions (Addendum)
Good to see you today!  Thanks for coming in.  For the Leg Pain   Take Tylenol arthritis pain medicine before you go to bed at night and in the  morning as needed.   Stretch before bed and when you first wake up    Walk for 20 min every day   Will will give you a flu shot  Schedule a AWV with Lauren -  Bring all your medications  Come back in 3 months to see me for physical and Bring all your medications

## 2017-08-25 ENCOUNTER — Ambulatory Visit (HOSPITAL_COMMUNITY)
Admission: EM | Admit: 2017-08-25 | Discharge: 2017-08-25 | Disposition: A | Discharge status: Home / Self Care | Payer: Medicare HMO | Attending: Family Medicine | Admitting: Family Medicine

## 2017-08-25 ENCOUNTER — Encounter (HOSPITAL_COMMUNITY): Payer: Self-pay | Admitting: Family Medicine

## 2017-08-25 DIAGNOSIS — R1033 Periumbilical pain: Secondary | ICD-10-CM | POA: Diagnosis not present

## 2017-08-25 NOTE — Discharge Instructions (Signed)
No alarming signs on exam. Start miralax and probiotics to help with constipation, which could be causing the abdominal pain. Follow up with PCP for further evaluation and treatment needed. Monitor for any worsening of symptoms, nausea, vomiting, worsening abdominal pain, fever, weakness, dizziness, go to the emergency department for further evaluation.

## 2017-08-25 NOTE — ED Provider Notes (Signed)
[facility]    CSN: [id number] Arrival date & time: 08/25/17  1122     History   Chief Complaint Chief Complaint  Patient presents with  . Abdominal Pain    HPI Dale [name] is a 76 y.o. male.   76 year old male history of CAD with NSTEMI 8 months ago, hyperlipidemia, prostate disease, GERD who comes in with 2 day history of periumbilical pain. Patient states pain first started yesterday, when he received many phone calls and felt stressed. He states pain comes and goes, and it seems to come on when people are asking him many questions. Denies nausea, vomiting, diarrhea. Pain is not associated with food or movement. He states the pain is been improving since he is here. Denies fever, chills, night sweats. Denies urinary frequency, dysuria, hematuria. Denies chest pain, shortness of breath, palpitations. He was able to eat breakfast today without problems. Last BM yesterday, that required straining.       Past Medical History:  Diagnosis Date  . Coronary artery disease   . Hyperlipemia   . Leg mass    rt  . Memory loss   . Myocardial infarction less than 4 weeks ago (HCC) 12/29/2016  . Prostate disease   . S/P angioplasty with stent to LCX     Patient Active Problem List   Diagnosis Date Noted  . Thigh pain 08/16/2017  . NSTEMI (non-ST elevated myocardial infarction) (HCC) 12/30/2016  . Non-STEMI (non-ST elevated myocardial infarction) (HCC) 12/30/2016  . Mild cognitive impairment 07/27/2016  . HLD (hyperlipidemia) 02/04/2015  . BPH (benign prostatic hyperplasia) 10/15/2014  . GERD (gastroesophageal reflux disease) 08/07/2014  . Quit smoking 11/20/2008  . BRADYCARDIA 10/11/2007    Past Surgical History:  Procedure Laterality Date  . CARDIAC CATHETERIZATION N/A 12/30/2016   Procedure: Coronary Stent Intervention;  Surgeon: Runell Gess, MD;  Location: [facility] INVASIVE CV LAB;  Service: Cardiovascular;  Laterality: N/A;  . CARDIAC CATHETERIZATION N/A  12/30/2016   Procedure: Coronary Angiography;  Surgeon: Runell Gess, MD;  Location: [facility] INVASIVE CV LAB;  Service: Cardiovascular;  Laterality: N/A;  . CARDIAC CATHETERIZATION N/A 12/30/2016   Procedure: Coronary Balloon Angioplasty;  Surgeon: Runell Gess, MD;  Location: [facility] INVASIVE CV LAB;  Service: Cardiovascular;  Laterality: N/A;       Home Medications    Prior to Admission medications   Medication Sig Start Date End Date Taking? Authorizing Provider  aspirin 81 MG chewable tablet Chew 1 tablet (81 mg total) by mouth daily. 08/08/14   Pincus Large, DO  calcium carbonate (TUMS) 500 MG chewable tablet Chew 1 tablet (200 mg of elemental calcium total) by mouth 3 (three) times daily as needed for indigestion or heartburn. 06/01/17   McKeag, Janine Ores, MD  donepezil (ARICEPT) 10 MG tablet Take 1 tablet (10 mg total) by mouth at bedtime. 03/06/17   Levert Feinstein, MD  memantine (NAMENDA) 10 MG tablet Take 1 tablet (10 mg total) by mouth 2 (two) times daily. 03/06/17   Levert Feinstein, MD  nitroGLYCERIN (NITROSTAT) 0.4 MG SL tablet Place 1 tablet (0.4 mg total) under the tongue every 5 (five) minutes x 3 doses as needed for chest pain. 01/01/17   Bhagat, Sharrell Ku, PA  pantoprazole (PROTONIX) 40 MG tablet Take 1 tablet (40 mg total) by mouth daily. 01/09/17   Leone Brand, NP  pravastatin (PRAVACHOL) 40 MG tablet TAKE 1 TABLET(40 MG) BY MOUTH DAILY 02/23/17   Carney Living, MD  tamsulosin (FLOMAX) 0.4  MG CAPS capsule TAKE 1 CAPSULE(0.4 MG) BY MOUTH DAILY AFTER SUPPER 02/23/17   Carney Living, MD  ticagrelor (BRILINTA) 90 MG TABS tablet Take 1 tablet (90 mg total) by mouth 2 (two) times daily. 01/01/17   Manson Passey, PA    Family History Family History  Problem Relation Age of Onset  . Cancer Mother        Posey Rea of type  . Heart attack Father   . Alzheimer's disease Father     Social History Social History  Substance Use Topics  . Smoking status: Former Smoker     Packs/day: 1.00    Years: 20.00    Types: Cigarettes    Quit date: 10/23/2011  . Smokeless tobacco: Never Used  . Alcohol use No     Comment: occ     Allergies   Patient has no known allergies.   Review of Systems Review of Systems  Reason unable to perform ROS: See HPI as above.     Physical Exam Triage Vital Signs ED Triage Vitals [08/25/17 1231]  Enc Vitals Group     BP 137/87     Pulse Rate (!) 51     Resp 18     Temp 97.7 F (36.5 C)     Temp src      SpO2 98 %     Weight      Height      Head Circumference      Peak Flow      Pain Score 3     Pain Loc      Pain Edu?      Excl. in GC?    No data found.   Updated Vital Signs BP 137/87   Pulse (!) 51   Temp 97.7 F (36.5 C)   Resp 18   SpO2 98%    Physical Exam  Constitutional: He is oriented to person, place, and time. He appears well-developed and well-nourished. No distress.  HENT:  Head: Normocephalic and atraumatic.  Cardiovascular: Normal rate, regular rhythm and normal heart sounds.  Exam reveals no gallop and no friction rub.   No murmur heard. Pulmonary/Chest: Effort normal and breath sounds normal. He has no wheezes. He has no rales.  Abdominal: Soft. Bowel sounds are normal. He exhibits no mass. There is no tenderness. There is no rebound and no guarding.  Neurological: He is alert and oriented to person, place, and time.  Skin: Skin is warm and dry.  Psychiatric: He has a normal mood and affect. His behavior is normal. Judgment normal.     UC Treatments / Results  Labs (all labs ordered are listed, but only abnormal results are displayed) Labs Reviewed - No data to display  EKG  EKG Interpretation None       Radiology No results found.  Procedures Procedures (including critical care time)  Medications Ordered in UC Medications - No data to display   Initial Impression / Assessment and Plan / UC Course  I have reviewed the triage vital signs and the nursing  notes.  Pertinent labs & imaging results that were available during my care of the patient were reviewed by me and considered in my medical decision making (see chart for details).    No alarming signs on exam. Upon review of notes, patient had discussed epigastric pain with PCP who treated for GERD, as well as discussed possible emotional causes of abdominal pain. PCP had mentioned possible trial of SSRI, which patient had  declined. Patient today complaining of periumbilical pain that is brought on by stress. Patient to follow up with PCP to discuss possible options of SSRI. Patient also with constipation, which could be cause of symptoms, to start miralax and probiotics. Push fluids. Return precautions given.   Final Clinical Impressions(s) / UC Diagnoses   Final diagnoses:  Periumbilical abdominal pain    New Prescriptions New Prescriptions   No medications on file      Lurline Idol 08/25/17 1316

## 2017-08-25 NOTE — ED Triage Notes (Signed)
Pt here for generalized burning abd pain. sts x 3 days. LBM yesterday and normal. Denies N,V,D.

## 2017-08-30 ENCOUNTER — Encounter: Payer: Self-pay | Admitting: *Deleted

## 2017-08-30 ENCOUNTER — Ambulatory Visit (INDEPENDENT_AMBULATORY_CARE_PROVIDER_SITE_OTHER): Payer: Medicare HMO | Admitting: *Deleted

## 2017-08-30 VITALS — BP 92/50 | HR 49 | Temp 97.8°F | Ht 64.0 in | Wt 150.4 lb

## 2017-08-30 DIAGNOSIS — Z0000 Encounter for general adult medical examination without abnormal findings: Secondary | ICD-10-CM | POA: Diagnosis not present

## 2017-08-30 NOTE — Patient Instructions (Addendum)
Dale [name],  Thank you for taking time to come for yourMedicare Wellness Visit. I appreciate your ongoing commitment to your health goals. Please review the following plan we discussed and let me know if I can assist you in the future.   These are the goals we discussed:  Goals    . Maintain current level of physical activity       Fall Prevention in the Home Falls can cause injuries. They can happen to people of all ages. There are many things you can do to make your home safe and to help prevent falls. What can I do on the outside of my home?  Regularly fix the edges of walkways and driveways and fix any cracks.  Remove anything that might make you trip as you walk through a door, such as a raised step or threshold.  Trim any bushes or trees on the path to your home.  Use bright outdoor lighting.  Clear any walking paths of anything that might make someone trip, such as rocks or tools.  Regularly check to see if handrails are loose or broken. Make sure that both sides of any steps have handrails.  Any raised decks and porches should have guardrails on the edges.  Have any leaves, snow, or ice cleared regularly.  Use sand or salt on walking paths during winter.  Clean up any spills in your garage right away. This includes oil or grease spills. What can I do in the bathroom?  Use night lights.  Install grab bars by the toilet and in the tub and shower. Do not use towel bars as grab bars.  Use non-skid mats or decals in the tub or shower.  If you need to sit down in the shower, use a plastic, non-slip stool.  Keep the floor dry. Clean up any water that spills on the floor as soon as it happens.  Remove soap buildup in the tub or shower regularly.  Attach bath mats securely with double-sided non-slip rug tape.  Do not have throw rugs and other things on the floor that can make you trip. What can I do in the bedroom?  Use night lights.  Make sure that you have a  light by your bed that is easy to reach.  Do not use any sheets or blankets that are too big for your bed. They should not hang down onto the floor.  Have a firm chair that has side arms. You can use this for support while you get dressed.  Do not have throw rugs and other things on the floor that can make you trip. What can I do in the kitchen?  Clean up any spills right away.  Avoid walking on wet floors.  Keep items that you use a lot in easy-to-reach places.  If you need to reach something above you, use a strong step stool that has a grab bar.  Keep electrical cords out of the way.  Do not use floor polish or wax that makes floors slippery. If you must use wax, use non-skid floor wax.  Do not have throw rugs and other things on the floor that can make you trip. What can I do with my stairs?  Do not leave any items on the stairs.  Make sure that there are handrails on both sides of the stairs and use them. Fix handrails that are broken or loose. Make sure that handrails are as long as the stairways.  Check any carpeting to make  sure that it is firmly attached to the stairs. Fix any carpet that is loose or worn.  Avoid having throw rugs at the top or bottom of the stairs. If you do have throw rugs, attach them to the floor with carpet tape.  Make sure that you have a light switch at the top of the stairs and the bottom of the stairs. If you do not have them, ask someone to add them for you. What else can I do to help prevent falls?  Wear shoes that: ? Do not have high heels. ? Have rubber bottoms. ? Are comfortable and fit you well. ? Are closed at the toe. Do not wear sandals.  If you use a stepladder: ? Make sure that it is fully opened. Do not climb a closed stepladder. ? Make sure that both sides of the stepladder are locked into place. ? Ask someone to hold it for you, if possible.  Clearly mark and make sure that you can see: ? Any grab bars or  handrails. ? First and last steps. ? Where the edge of each step is.  Use tools that help you move around (mobility aids) if they are needed. These include: ? Canes. ? Walkers. ? Scooters. ? Crutches.  Turn on the lights when you go into a dark area. Replace any light bulbs as soon as they burn out.  Set up your furniture so you have a clear path. Avoid moving your furniture around.  If any of your floors are uneven, fix them.  If there are any pets around you, be aware of where they are.  Review your medicines with your doctor. Some medicines can make you feel dizzy. This can increase your chance of falling. Ask your doctor what other things that you can do to help prevent falls. This information is not intended to replace advice given to you by your health care provider. Make sure you discuss any questions you have with your health care provider. Document Released: 09/24/2009 Document Revised: 05/05/2016 Document Reviewed: 01/02/2015 Elsevier Interactive Patient Education  2018 [employer].   Health Maintenance, Male A healthy lifestyle and preventive care is important for your health and wellness. Ask your health care provider about what schedule of regular examinations is right for you. What should I know about weight and diet? Eat a Healthy Diet  Eat plenty of vegetables, fruits, whole grains, low-fat dairy products, and lean protein.  Do not eat a lot of foods high in solid fats, added sugars, or salt.  Maintain a Healthy Weight Regular exercise can help you achieve or maintain a healthy weight. You should:  Do at least 150 minutes of exercise each week. The exercise should increase your heart rate and make you sweat (moderate-intensity exercise).  Do strength-training exercises at least twice a week.  Watch Your Levels of Cholesterol and Blood Lipids  Have your blood tested for lipids and cholesterol every 5 years starting at 74 years of age. If you are at high  risk for heart disease, you should start having your blood tested when you are 74 years old. You may need to have your cholesterol levels checked more often if: ? Your lipid or cholesterol levels are high. ? You are older than 74 years of age. ? You are at high risk for heart disease.  What should I know about cancer screening? Many types of cancers can be detected early and may often be prevented. Lung Cancer  You should be screened every  year for lung cancer if: ? You are a current smoker who has smoked for at least 30 years. ? You are a former smoker who has quit within the past 15 years.  Talk to your health care provider about your screening options, when you should start screening, and how often you should be screened.  Colorectal Cancer  Routine colorectal cancer screening usually begins at 74 years of age and should be repeated every 5-10 years until you are 74 years old. You may need to be screened more often if early forms of precancerous polyps or small growths are found. Your health care provider may recommend screening at an earlier age if you have risk factors for colon cancer.  Your health care provider may recommend using home test kits to check for hidden blood in the stool.  A small camera at the end of a tube can be used to examine your colon (sigmoidoscopy or colonoscopy). This checks for the earliest forms of colorectal cancer.  Prostate and Testicular Cancer  Depending on your age and overall health, your health care provider may do certain tests to screen for prostate and testicular cancer.  Talk to your health care provider about any symptoms or concerns you have about testicular or prostate cancer.  Skin Cancer  Check your skin from head to toe regularly.  Tell your health care provider about any new moles or changes in moles, especially if: ? There is a change in a mole's size, shape, or color. ? You have a mole that is larger than a pencil  eraser.  Always use sunscreen. Apply sunscreen liberally and repeat throughout the day.  Protect yourself by wearing long sleeves, pants, a wide-brimmed hat, and sunglasses when outside.  What should I know about heart disease, diabetes, and high blood pressure?  If you are 73-59 years of age, have your blood pressure checked every 3-5 years. If you are 80 years of age or older, have your blood pressure checked every year. You should have your blood pressure measured twice-once when you are at a hospital or clinic, and once when you are not at a hospital or clinic. Record the average of the two measurements. To check your blood pressure when you are not at a hospital or clinic, you can use: ? An automated blood pressure machine at a pharmacy. ? A home blood pressure monitor.  Talk to your health care provider about your target blood pressure.  If you are between 48-9 years old, ask your health care provider if you should take aspirin to prevent heart disease.  Have regular diabetes screenings by checking your fasting blood sugar level. ? If you are at a normal weight and have a low risk for diabetes, have this test once every three years after the age of 63. ? If you are overweight and have a high risk for diabetes, consider being tested at a younger age or more often.  A one-time screening for abdominal aortic aneurysm (AAA) by ultrasound is recommended for men aged 65-75 years who are current or former smokers. What should I know about preventing infection? Hepatitis B If you have a higher risk for hepatitis B, you should be screened for this virus. Talk with your health care provider to find out if you are at risk for hepatitis B infection. Hepatitis C Blood testing is recommended for:  Everyone born from [birth year] through 1963.  Anyone with known risk factors for hepatitis C.  Sexually Transmitted Diseases (STDs)  You  should be screened each year for STDs including gonorrhea and  chlamydia if: ? You are sexually active and are younger than 75 years of age. ? You are older than 74 years of age and your health care provider tells you that you are at risk for this type of infection. ? Your sexual activity has changed since you were last screened and you are at an increased risk for chlamydia or gonorrhea. Ask your health care provider if you are at risk.  Talk with your health care provider about whether you are at high risk of being infected with HIV. Your health care provider may recommend a prescription medicine to help prevent HIV infection.  What else can I do?  Schedule regular health, dental, and eye exams.  Stay current with your vaccines (immunizations).  Do not use any tobacco products, such as cigarettes, chewing tobacco, and e-cigarettes. If you need help quitting, ask your health care provider.  Limit alcohol intake to no more than 2 drinks per day. One drink equals 12 ounces of beer, 5 ounces of wine, or 1 ounces of hard liquor.  Do not use street drugs.  Do not share needles.  Ask your health care provider for help if you need support or information about quitting drugs.  Tell your health care provider if you often feel depressed.  Tell your health care provider if you have ever been abused or do not feel safe at home. This information is not intended to replace advice given to you by your health care provider. Make sure you discuss any questions you have with your health care provider. Document Released: 05/26/2008 Document Revised: 07/27/2016 Document Reviewed: 09/01/2015 Elsevier Interactive Patient Education  [employer].

## 2017-08-30 NOTE — Progress Notes (Addendum)
Subjective:   Dale [name] is a 76 y.o. male who presents for Medicare Annual/Subsequent preventive examination.  Cardiac Risk Factors include: advanced age (>33men, >72 women);dyslipidemia;male gender;smoking/ tobacco exposure       Objective:    Vitals: BP (!) 92/50 (BP Location: Right Arm, Patient Position: Sitting, Cuff Size: Normal)   Pulse (!) 49   Temp 97.8 F (36.6 C) (Oral)   Ht 5\' 4"  (1.626 m)   Wt 150 lb 6.4 oz (68.2 kg)   SpO2 97%   BMI 25.82 kg/m   Body mass index is 25.82 kg/m.  Tobacco History  Smoking Status  . Former Smoker  . Packs/day: 1.00  . Years: 20.00  . Types: Cigarettes  . Quit date: 10/23/2011  Smokeless Tobacco  . Never Used     Counseling given: Yes Patient is former smoker with no plans to restart   Past Medical History:  Diagnosis Date  . Coronary artery disease   . GERD (gastroesophageal reflux disease)   . Hyperlipemia   . Leg mass    rt  . Memory loss   . Myocardial infarction less than 4 weeks ago (HCC) 12/29/2016  . Prostate disease   . S/P angioplasty with stent to LCX    Past Surgical History:  Procedure Laterality Date  . CARDIAC CATHETERIZATION N/A 12/30/2016   Procedure: Coronary Stent Intervention;  Surgeon: Runell Gess, MD;  Location: [facility] INVASIVE CV LAB;  Service: Cardiovascular;  Laterality: N/A;  . CARDIAC CATHETERIZATION N/A 12/30/2016   Procedure: Coronary Angiography;  Surgeon: Runell Gess, MD;  Location: [facility] INVASIVE CV LAB;  Service: Cardiovascular;  Laterality: N/A;  . CARDIAC CATHETERIZATION N/A 12/30/2016   Procedure: Coronary Balloon Angioplasty;  Surgeon: Runell Gess, MD;  Location: [facility] INVASIVE CV LAB;  Service: Cardiovascular;  Laterality: N/A;   Family History  Problem Relation Age of Onset  . Cancer Mother        Posey Rea of type  . Heart attack Father   . Alzheimer's disease Father    History  Sexual Activity  . Sexual activity: Not on file   Patient not currently sexually  active but would like to be. Appt made with PCP to discuss med for ED. Discussed need for condom if he becomes sexually active. States he will.  Outpatient Encounter Prescriptions as of 08/30/2017  Medication Sig  . aspirin 81 MG chewable tablet Chew 1 tablet (81 mg total) by mouth daily.  . calcium carbonate (TUMS) 500 MG chewable tablet Chew 1 tablet (200 mg of elemental calcium total) by mouth 3 (three) times daily as needed for indigestion or heartburn.  . donepezil (ARICEPT) 10 MG tablet Take 1 tablet (10 mg total) by mouth at bedtime.  . memantine (NAMENDA) 10 MG tablet Take 1 tablet (10 mg total) by mouth 2 (two) times daily.  . nitroGLYCERIN (NITROSTAT) 0.4 MG SL tablet Place 1 tablet (0.4 mg total) under the tongue every 5 (five) minutes x 3 doses as needed for chest pain.  . pantoprazole (PROTONIX) 40 MG tablet Take 1 tablet (40 mg total) by mouth daily.  . pravastatin (PRAVACHOL) 40 MG tablet TAKE 1 TABLET(40 MG) BY MOUTH DAILY  . tamsulosin (FLOMAX) 0.4 MG CAPS capsule TAKE 1 CAPSULE(0.4 MG) BY MOUTH DAILY AFTER SUPPER  . ticagrelor (BRILINTA) 90 MG TABS tablet Take 1 tablet (90 mg total) by mouth 2 (two) times daily.   No facility-administered encounter medications on file as of 08/30/2017.    Patient did  not bring medications in. Does not know names of meds but thinks he is taking everything on list.  Activities of Daily Living In your present state of health, do you have any difficulty performing the following activities: 08/30/2017  Hearing? N  Vision? Y  Difficulty concentrating or making decisions? Y  Walking or climbing stairs? N  Dressing or bathing? N  Doing errands, shopping? N  Preparing Food and eating ? N  Using the Toilet? N  In the past six months, have you accidently leaked urine? Y  Do you have problems with loss of bowel control? N  Managing your Medications? N  Managing your Finances? Y  Comment Receives help from friend  Economist? N  Some recent data might be hidden   Home Safety:  My home has a working smoke alarm:  Patient does not remember. Wrote note reminding him to check if he has one and that it works.           My home throw rugs have been fastened down to the floor or removed:  One with non-slip back I have a non-slip surface or non-slip mats in the bathtub and shower:  No, discussed applying non-slip decals. Does have grab bar        All my home's stairs have handrails, including any outdoor stairs  One level home with 2 outside steps with handrails          My home's floors, stairs and hallways are free from clutter, wires and cords:  Yes     I have animals in my home  No I wear seatbelts consistently:  "Most of the time." Discussed importance of wearing all the time.  Patient Care Team: Carney Living, MD as PCP - General   [place]  Assessment:     Exercise Activities and Dietary recommendations Current Exercise Habits: Home exercise routine (Works hard around house and mows lawns), Exercise limited by: None identified  Goals    . Maintain current level of physical activity      Fall Risk Fall Risk  08/30/2017 08/16/2017 04/05/2017 02/28/2017 12/27/2016  Falls in the past year? No No No No No   TUG Test:  Done in 12 seconds. Patient used one hand to sit back down only. Falls prevention discussed in detail and literature given.  Cognitive Function: Mini-Cog  Failed with score 2/5  Depression Screen PHQ 2/9 Scores 08/30/2017 08/16/2017 06/01/2017 05/31/2017  PHQ - 2 Score 0 0 0 0    Cognitive Function MMSE - Mini Mental State Exam 03/06/2017 11/22/2016  Orientation to time 4 5  Orientation to Place 4 4  Registration 3 3  Attention/ Calculation 4 5  Recall 2 1  Language- name 2 objects 2 2  Language- repeat 1 1  Language- follow 3 step command 3 3  Language- read & follow direction 1 1  Write a sentence 1 1  Copy design 0 1  Total score 25 27         Immunization History  Administered Date(s) Administered  . Influenza Split 10/31/2011, 08/22/2012  . Influenza Whole 10/11/2007, 12/25/2009, 10/01/2010  . Influenza,inj,Quad PF,6+ Mos 09/18/2013, 08/15/2014, 02/03/2016, 09/13/2016, 08/16/2017  . Pneumococcal Conjugate-13 02/04/2015  . Pneumococcal Polysaccharide-23 04/16/2009  . Td 11/20/2008   Screening Tests Health Maintenance  Topic Date Due  . DTaP/Tdap/Td (1 - Tdap) 11/21/2008  . TETANUS/TDAP  11/20/2018  . COLONOSCOPY  10/27/2019  . INFLUENZA VACCINE  Completed  . PNA vac Low Risk Adult  Completed  Patient not due for TDaP till 2019    Plan:     I have personally reviewed and noted the following in the patient's chart:   . Medical and social history . Use of alcohol, tobacco or illicit drugs  . Current medications and supplements . Functional ability and status . Nutritional status . Physical activity . Advanced directives . List of other physicians . Hospitalizations, surgeries, and ER visits in previous 12 months . Vitals . Screenings to include cognitive, depression, and falls . Referrals and appointments  In addition, I have reviewed and discussed with patient certain preventive protocols, quality metrics, and best practice recommendations. A written personalized care plan for preventive services as well as general preventive health recommendations were provided to patient.     Fredderick Severance, RN  08/30/2017

## 2017-09-04 ENCOUNTER — Encounter (HOSPITAL_COMMUNITY): Payer: Self-pay | Admitting: Emergency Medicine

## 2017-09-04 ENCOUNTER — Emergency Department (HOSPITAL_COMMUNITY): Payer: Medicare HMO

## 2017-09-04 ENCOUNTER — Emergency Department (HOSPITAL_COMMUNITY)
Admission: EM | Admit: 2017-09-04 | Discharge: 2017-09-04 | Disposition: A | Discharge status: Home / Self Care | Payer: Medicare HMO | Attending: Emergency Medicine | Admitting: Emergency Medicine

## 2017-09-04 DIAGNOSIS — Z7982 Long term (current) use of aspirin: Secondary | ICD-10-CM | POA: Insufficient documentation

## 2017-09-04 DIAGNOSIS — I2510 Atherosclerotic heart disease of native coronary artery without angina pectoris: Secondary | ICD-10-CM | POA: Insufficient documentation

## 2017-09-04 DIAGNOSIS — R0789 Other chest pain: Secondary | ICD-10-CM | POA: Diagnosis not present

## 2017-09-04 DIAGNOSIS — R079 Chest pain, unspecified: Secondary | ICD-10-CM | POA: Insufficient documentation

## 2017-09-04 DIAGNOSIS — I10 Essential (primary) hypertension: Secondary | ICD-10-CM

## 2017-09-04 DIAGNOSIS — Z79899 Other long term (current) drug therapy: Secondary | ICD-10-CM | POA: Diagnosis not present

## 2017-09-04 DIAGNOSIS — Z87891 Personal history of nicotine dependence: Secondary | ICD-10-CM | POA: Insufficient documentation

## 2017-09-04 LAB — CBC
HCT: 41.2 % (ref 39.0–52.0)
Hemoglobin: 12.6 g/dL — ABNORMAL LOW (ref 13.0–17.0)
MCH: 27.3 pg (ref 26.0–34.0)
MCHC: 30.6 g/dL (ref 30.0–36.0)
MCV: 89.2 fL (ref 78.0–100.0)
PLATELETS: 220 10*3/uL (ref 150–400)
RBC: 4.62 MIL/uL (ref 4.22–5.81)
RDW: 12.8 % (ref 11.5–15.5)
WBC: 8.7 10*3/uL (ref 4.0–10.5)

## 2017-09-04 LAB — BASIC METABOLIC PANEL
Anion gap: 6 (ref 5–15)
BUN: 12 mg/dL (ref 6–20)
CHLORIDE: 108 mmol/L (ref 101–111)
CO2: 29 mmol/L (ref 22–32)
CREATININE: 1.11 mg/dL (ref 0.61–1.24)
Calcium: 9.1 mg/dL (ref 8.9–10.3)
GFR calc Af Amer: >60 mL/min (ref >60)
GFR calc non Af Amer: >60 mL/min (ref >60)
Glucose, Bld: 110 mg/dL — ABNORMAL HIGH (ref 65–99)
Potassium: 3.6 mmol/L (ref 3.5–5.1)
Sodium: 143 mmol/L (ref 135–145)

## 2017-09-04 LAB — I-STAT TROPONIN, ED
Troponin i, poc: 0.01 ng/mL (ref 0.00–0.08)
Troponin i, poc: 0.01 ng/mL (ref 0.00–0.08)

## 2017-09-04 MED ORDER — CYCLOBENZAPRINE HCL 5 MG PO TABS: 5.0000 mg | ORAL_TABLET | Freq: Two times a day (BID) | ORAL | 0 refills | Status: DC | PRN

## 2017-09-04 MED ORDER — NITROGLYCERIN 0.4 MG SL SUBL
0.4000 mg | SUBLINGUAL_TABLET | SUBLINGUAL | Status: DC | PRN
  Administered 2017-09-04: 0.4 mg via SUBLINGUAL
  Filled 2017-09-04: qty 1

## 2017-09-04 MED ORDER — ASPIRIN 81 MG PO CHEW
324.0000 mg | CHEWABLE_TABLET | Freq: Once | ORAL | Status: AC
  Administered 2017-09-04: 324 mg via ORAL
  Filled 2017-09-04: qty 4

## 2017-09-04 NOTE — ED Provider Notes (Signed)
[facility]-EMERGENCY DEPT Provider Note   CSN: [id number] Arrival date & time: 09/04/17  4098     History   Chief Complaint Chief Complaint  Patient presents with  . Chest Pain    HPI Dale [name] is a 76 y.o. male who presents non-radiating left sided chest pain that began at approximately 1am this morning. Patient Ports that he was outside working on his car when the chest pain began. He does note that he was lifting out of the car with his left upper extremity but he does not know if that caused his pain. Patient reports that when he initially started experiencing pain, it felt similar to what he experienced with his heart attack in January 2018. He reports that he did get slightly diaphoretic with the pain but no nausea. He did not take any medication initially after the pain. She reports the chest pain has persisted throughout the day, prompting ED visit. On ED arrival, patient states that the pain is improved to 2/10. He has not taken any medications prior to ED arrival. Patient reports that his last cardiology visit was probably 6 months ago. Patient reports he has been compliant with medications. Patient denies any fever, chills, difficulty breathing, abdominal pain, nausea/vomiting, numbness/weakness of his arms or legs.  The history is provided by the patient.    Past Medical History:  Diagnosis Date  . Coronary artery disease   . GERD (gastroesophageal reflux disease)   . Hyperlipemia   . Leg mass    rt  . Memory loss   . Myocardial infarction less than 4 weeks ago (HCC) 12/29/2016  . Prostate disease   . S/P angioplasty with stent to LCX     Patient Active Problem List   Diagnosis Date Noted  . Thigh pain 08/16/2017  . NSTEMI (non-ST elevated myocardial infarction) (HCC) 12/30/2016  . Non-STEMI (non-ST elevated myocardial infarction) (HCC) 12/30/2016  . Mild cognitive impairment 07/27/2016  . HLD (hyperlipidemia) 02/04/2015  . BPH (benign prostatic hyperplasia)  10/15/2014  . GERD (gastroesophageal reflux disease) 08/07/2014  . Quit smoking 11/20/2008  . BRADYCARDIA 10/11/2007    Past Surgical History:  Procedure Laterality Date  . CARDIAC CATHETERIZATION N/A 12/30/2016   Procedure: Coronary Stent Intervention;  Surgeon: Runell Gess, MD;  Location: [facility] INVASIVE CV LAB;  Service: Cardiovascular;  Laterality: N/A;  . CARDIAC CATHETERIZATION N/A 12/30/2016   Procedure: Coronary Angiography;  Surgeon: Runell Gess, MD;  Location: [facility] INVASIVE CV LAB;  Service: Cardiovascular;  Laterality: N/A;  . CARDIAC CATHETERIZATION N/A 12/30/2016   Procedure: Coronary Balloon Angioplasty;  Surgeon: Runell Gess, MD;  Location: [facility] INVASIVE CV LAB;  Service: Cardiovascular;  Laterality: N/A;       Home Medications    Prior to Admission medications   Medication Sig Start Date End Date Taking? Authorizing Provider  aspirin 81 MG chewable tablet Chew 1 tablet (81 mg total) by mouth daily. 08/08/14  Yes Caryl Ada Y, DO  calcium carbonate (TUMS) 500 MG chewable tablet Chew 1 tablet (200 mg of elemental calcium total) by mouth 3 (three) times daily as needed for indigestion or heartburn. 06/01/17  Yes McKeag, Janine Ores, MD  donepezil (ARICEPT) 10 MG tablet Take 1 tablet (10 mg total) by mouth at bedtime. 03/06/17  Yes Levert Feinstein, MD  loratadine (CLARITIN) 10 MG tablet Take 10 mg by mouth daily.   Yes [provider]  memantine (NAMENDA) 10 MG tablet Take 1 tablet (10 mg total) by mouth 2 (two)  times daily. 03/06/17  Yes Levert Feinstein, MD  nitroGLYCERIN (NITROSTAT) 0.4 MG SL tablet Place 1 tablet (0.4 mg total) under the tongue every 5 (five) minutes x 3 doses as needed for chest pain. 01/01/17  Yes Bhagat, Bhavinkumar, PA  pantoprazole (PROTONIX) 40 MG tablet Take 1 tablet (40 mg total) by mouth daily. 01/09/17  Yes Leone Brand, NP  pravastatin (PRAVACHOL) 40 MG tablet TAKE 1 TABLET(40 MG) BY MOUTH DAILY 02/23/17  Yes Chambliss, Estill Batten, MD  tamsulosin  (FLOMAX) 0.4 MG CAPS capsule TAKE 1 CAPSULE(0.4 MG) BY MOUTH DAILY AFTER SUPPER 02/23/17  Yes Chambliss, Estill Batten, MD  ticagrelor (BRILINTA) 90 MG TABS tablet Take 1 tablet (90 mg total) by mouth 2 (two) times daily. 01/01/17  Yes Bhagat, Bhavinkumar, PA  cyclobenzaprine (FLEXERIL) 5 MG tablet Take 1 tablet (5 mg total) by mouth 2 (two) times daily as needed for muscle spasms. 09/04/17   Maxwell Caul, PA-C    Family History Family History  Problem Relation Age of Onset  . Cancer Mother        Posey Rea of type  . Heart attack Father   . Alzheimer's disease Father     Social History Social History  Substance Use Topics  . Smoking status: Former Smoker    Packs/day: 1.00    Years: 20.00    Types: Cigarettes    Quit date: 10/23/2011  . Smokeless tobacco: Never Used  . Alcohol use No     Comment: occ     Allergies   Patient has no known allergies.   Review of Systems Review of Systems  Constitutional: Positive for diaphoresis. Negative for chills and fever.  HENT: Negative for congestion.   Eyes: Negative for visual disturbance.  Respiratory: Negative for cough and shortness of breath.   Cardiovascular: Positive for chest pain.  Gastrointestinal: Negative for abdominal pain, diarrhea, nausea and vomiting.  Genitourinary: Negative for dysuria and hematuria.  Skin: Negative for rash.  Neurological: Negative for weakness, numbness and headaches.     Physical Exam Updated Vital Signs BP (!) 149/80   Pulse (!) 47   Temp 98.3 F (36.8 C) (Oral)   Resp 17   SpO2 99%   Physical Exam  Constitutional: He is oriented to person, place, and time. He appears well-developed and well-nourished.  Sitting comfortably on examination table  HENT:  Head: Normocephalic and atraumatic.  Mouth/Throat: Oropharynx is clear and moist and mucous membranes are normal.  Eyes: Pupils are equal, round, and reactive to light. Conjunctivae, EOM and lids are normal.  Small pupils bilaterally    Neck: Full passive range of motion without pain.  Cardiovascular: Regular rhythm, normal heart sounds and normal pulses.  Bradycardia present.  Exam reveals no gallop and no friction rub.   No murmur heard. Pulses:      Radial pulses are 2+ on the right side, and 2+ on the left side.  Pulmonary/Chest: Effort normal and breath sounds normal.  No evidence of respiratory distress. Able to speak in full sentences without difficulty. Tenderness palpation to anterior chest wall. Pain is not reproduced with abduction of bilateral upper extremities.  Abdominal: Soft. Normal appearance. There is no tenderness. There is no rigidity and no guarding.  Musculoskeletal: Normal range of motion.  Neurological: He is alert and oriented to person, place, and time.  Cranial nerves III-XII intact Follows commands, Moves all extremities  5/5 strength to BUE and BLE  Sensation intact throughout all major nerve distributions No slurred speech.  No facial droop.   Skin: Skin is warm and dry. Capillary refill takes less than 2 seconds.  Psychiatric: He has a normal mood and affect. His speech is normal.  Nursing note and vitals reviewed.    ED Treatments / Results  Labs (all labs ordered are listed, but only abnormal results are displayed) Labs Reviewed  BASIC METABOLIC PANEL - Abnormal; Notable for the following:       Result Value   Glucose, Bld 110 (*)    All other components within normal limits  CBC - Abnormal; Notable for the following:    Hemoglobin 12.6 (*)    All other components within normal limits  I-STAT TROPONIN, ED  I-STAT TROPONIN, ED    EKG  EKG Interpretation  Date/Time:  Monday September 04 2017 08:35:04 EDT Ventricular Rate:  60 PR Interval:  228 QRS Duration: 94 QT Interval:  412 QTC Calculation: 412 R Axis:   30 Text Interpretation:  Sinus rhythm with 1st degree A-V block Otherwise normal ECG No significant change since last tracing Confirmed by Gwyneth Sprout ([id number])  on 09/04/2017 12:03:32 PM       Radiology Dg Chest 2 View  Result Date: 09/04/2017 CLINICAL DATA:  Left-sided chest pain beginning last evening. Personal history of myocardial infarction and coronary stent. EXAM: CHEST  2 VIEW COMPARISON:  Two-view chest x-ray in 12/30/2016. FINDINGS: The heart size and mediastinal contours are within normal limits. Both lungs are clear. The visualized skeletal structures are unremarkable. IMPRESSION: No active cardiopulmonary disease. Electronically Signed   By: Marin Roberts M.D.   On: 09/04/2017 09:24    Procedures Procedures (including critical care time)  Medications Ordered in ED Medications  aspirin chewable tablet 324 mg (324 mg Oral Given 09/04/17 1325)     Initial Impression / Assessment and Plan / ED Course  I have reviewed the triage vital signs and the nursing notes.  Pertinent labs & imaging results that were available during my care of the patient were reviewed by me and considered in my medical decision making (see chart for details).     76 year old male with past medical history of end-stage January 2018 he presents with chest pain that began at 1 AM this morning. Associated with diaphoresis but no nausea. No shortness of breath.  Patient is afebrile, non-toxic appearing, sitting comfortably on examination table. Vital signs reviewed and stable. Consider ACS etiology versus acute infectious etiology versus musculoskeletal pain. Initial labs and imaging ordered at triage including troponin, CBC, BMP, EKG, chest x-ray. Plan to give ASA and nitro in the department for pain.  Labs and imaging reviewed. Chest x-ray negative for any acute infectious etiology. Be unremarkable. CBC unremarkable. Initial troponin is negative. EKG shows first-degree AV block. When compared to 01/16/17. Appears similar. Given patient's presentation and risk factors, he has a heart score of 3. Discussed patient with Dr. Anitra Lauth. Will plan to consult cardiology  for further workup.   Discussed with Trish (Cardiology). Will come evaluate patinet in the ED for further evaluation.   He reports that pain has improved after initial nitro given in the ED.   Repeat troponin is negative. Cardiology is evaluating patient in the ED now. We'll await cardiology recommendations.  Discussed with NP [facility] (Cardiology) after evaluation. Would like to ambulate patient in the ED for evaluation of pain. If patient does not have return of exertional chest pain can be reasonably sent home. Recommends no NSAIDs for treatment at home but can be sent home with  tylenol and muscle relaxer. Will plan to arrange follow-up appointment in approximately 1 week.   I personally ambulated the patient around the emergency department without difficulty. Patient does not have any chest pain when we ambulated around the department. He denied any shortness of breath or any other symptoms. Return to the room, and patient did not have any following chest pain. Vitals stable. Patient denies any symptoms at this time. Per Cardiology recommendation, patient can be discharged home. He has an appointment with cardiology next week for evaluation. Will give a small dose of muscle relaxer for pain relief. Precautions regarding muscle relaxers discussed with patient. Instructed patient to refrain from using NSAIDs. Also instructed patient to continue taking his medications as directed. Strict return precautions discussed. Patient expresses understanding and agreement to plan.    Final Clinical Impressions(s) / ED Diagnoses   Final diagnoses:  Chest pain, unspecified type    New Prescriptions Discharge Medication List as of 09/04/2017  4:17 PM    START taking these medications   Details  cyclobenzaprine (FLEXERIL) 5 MG tablet Take 1 tablet (5 mg total) by mouth 2 (two) times daily as needed for muscle spasms., Starting Mon 09/04/2017, Print         Maxwell Caul, PA-C 09/04/17 2316      Gwyneth Sprout, MD 09/05/17 1527

## 2017-09-04 NOTE — ED Notes (Signed)
Pt wheeled back to room from waiting area. Pt assisted to bed and placed in gown and onto monitor.

## 2017-09-04 NOTE — Discharge Instructions (Signed)
Take Tylenol as directed for pain. Do not take any ibuprofen, Advil, Aleve, Motrin.   As we discussed, he can take a muscle relaxer for pain at home. Do not drink or drive while taking this medication.   You can schedule appointment with cardiology next week on October 2nd at 8:30 am.   Follow-up with your primary care doctor in next 2-4 days for further evaluation.  Return the emergency Department for any worsening chest pain, difficulty breathing, nausea/vomiting, sweating, numbness/weakness of her arms or legs or any other worsening or concerning symptoms.

## 2017-09-04 NOTE — Consult Note (Signed)
The patient has been seen in conjunction with Berton Bon, NP-C. All aspects of care have been considered and discussed. The patient has been personally interviewed, examined, and all clinical data has been reviewed.   Prolonged chest pain that started when he forcefully used his left arm to pull up the hood on his car. The discomfort has not resolved since that time. Still ongoing since last PM and different then with his MI.  Since arrival in ER POC Trop I negative x 2, ECG is non ischemic, and patient is comfortable.  MI has been ruled out. Have asked him to ambulate in ER and if feels well, will be okay to discharge and f/u in office in 5-7 days. If discomfort worsens with ambulation in ER or when he gets home he should notify us and will probably need admission.   Cardiology Consultation:   Patient ID: Dale [name]; [id number]; 1941-01-23   Admit date: 09/04/2017 Date of Consult: 09/04/2017  Primary Care Provider: Carney Living, MD Primary Cardiologist: Dr. Allyson Sabal   Patient Profile:   Dale [name] is a 76 y.o. male with a hx of NSTEMI s/p DES LCirc 12/2016, Hyperlipidemia and GERD  who is being seen today for the evaluation of chest pain at the request of Dr. Anitra Lauth.  History of Present Illness:   Dale [name] had a NSTEMI in January of this year and required a drug-eluting stent to left circumflex. He is treated with Brilinta 90 mg twice a day and aspirin 81 mg. He states that he's been taking his medication regularly and has not missed any doses of Brilinta recently. Yesterday evening his car battery went dead and when he opened the hood with his left arm, "snatching it up", he developed left-sided chest pain "behind the breast". At the time he did not note any other associated symptoms. He went on to bed last night. When he woke up this morning he felt that his left chest was very sore and worse than last night, 4-5/10 in intensity. He felt that it may be somewhat  similar to his symptoms prior to his MI. He was not overtly short of breath but he felt that he had to take a deep breath every few minutes. He had no lightheadedness, palpitations, nausea, or diaphoresis. This steady soreness in the left chest continued until he got to the ED. He thinks that the aspirin they gave him has eased off the pain now. He winces when he raises his left arm to show me the movement he made last night but he is not sure if what he feels with the movement is the chest soreness that he had this morning. I am not able to reproduce the pain with palpation.  Prior to this episode the patient denies any recent exertional chest pain or shortness of breath. He has had no orthopnea, PND or edema. He describes being quite active.  The patient is a previous smoker of one pack per day, having quit approximately 10 years ago. He denies striking alcohol.  Significant findings: Troponins  0.01, 0.01 SCr  1.11 K+ 3.6 Hgb 12.6,   WBC 8.7 CXR:  No active cardiopulmonary disease EKG: Normal sinus rhythm with first-degree AV block, 60 bpm, no acute ischemic changes  Past Medical History:  Diagnosis Date  . Coronary artery disease   . GERD (gastroesophageal reflux disease)   . Hyperlipemia   . Leg mass    rt  . Memory loss   .  Myocardial infarction less than 4 weeks ago (HCC) 12/29/2016  . Prostate disease   . S/P angioplasty with stent to LCX     Past Surgical History:  Procedure Laterality Date  . CARDIAC CATHETERIZATION N/A 12/30/2016   Procedure: Coronary Stent Intervention;  Surgeon: Runell Gess, MD;  Location: [facility] INVASIVE CV LAB;  Service: Cardiovascular;  Laterality: N/A;  . CARDIAC CATHETERIZATION N/A 12/30/2016   Procedure: Coronary Angiography;  Surgeon: Runell Gess, MD;  Location: [facility] INVASIVE CV LAB;  Service: Cardiovascular;  Laterality: N/A;  . CARDIAC CATHETERIZATION N/A 12/30/2016   Procedure: Coronary Balloon Angioplasty;  Surgeon: Runell Gess, MD;   Location: [facility] INVASIVE CV LAB;  Service: Cardiovascular;  Laterality: N/A;     Home Medications:  Prior to Admission medications   Medication Sig Start Date End Date Taking? Authorizing Provider  aspirin 81 MG chewable tablet Chew 1 tablet (81 mg total) by mouth daily. 08/08/14  Yes Caryl Ada Y, DO  calcium carbonate (TUMS) 500 MG chewable tablet Chew 1 tablet (200 mg of elemental calcium total) by mouth 3 (three) times daily as needed for indigestion or heartburn. 06/01/17  Yes McKeag, Janine Ores, MD  donepezil (ARICEPT) 10 MG tablet Take 1 tablet (10 mg total) by mouth at bedtime. 03/06/17  Yes Levert Feinstein, MD  loratadine (CLARITIN) 10 MG tablet Take 10 mg by mouth daily.   Yes [provider]  memantine (NAMENDA) 10 MG tablet Take 1 tablet (10 mg total) by mouth 2 (two) times daily. 03/06/17  Yes Levert Feinstein, MD  nitroGLYCERIN (NITROSTAT) 0.4 MG SL tablet Place 1 tablet (0.4 mg total) under the tongue every 5 (five) minutes x 3 doses as needed for chest pain. 01/01/17  Yes Bhagat, Bhavinkumar, PA  pantoprazole (PROTONIX) 40 MG tablet Take 1 tablet (40 mg total) by mouth daily. 01/09/17  Yes Leone Brand, NP  pravastatin (PRAVACHOL) 40 MG tablet TAKE 1 TABLET(40 MG) BY MOUTH DAILY 02/23/17  Yes Chambliss, Estill Batten, MD  tamsulosin (FLOMAX) 0.4 MG CAPS capsule TAKE 1 CAPSULE(0.4 MG) BY MOUTH DAILY AFTER SUPPER 02/23/17  Yes Chambliss, Estill Batten, MD  ticagrelor (BRILINTA) 90 MG TABS tablet Take 1 tablet (90 mg total) by mouth 2 (two) times daily. 01/01/17  Yes Bhagat, Sharrell Ku, PA    Inpatient Medications: Scheduled Meds:  Continuous Infusions:  PRN Meds: nitroGLYCERIN  Allergies:   No Known Allergies  Social History:   Social History   Social History  . Marital status: Widowed    Spouse name: N/A  . Number of children: 2  . Years of education: 6th   Occupational History  . Retired    Social History Main Topics  . Smoking status: Former Smoker    Packs/day: 1.00     Years: 20.00    Types: Cigarettes    Quit date: 10/23/2011  . Smokeless tobacco: Never Used  . Alcohol use No     Comment: occ  . Drug use: No  . Sexual activity: Not Currently     Comment: Patient would like to be. Discussed wearing condom if becomes sexually active   Other Topics Concern  . Not on file   Social History Narrative   Past Medical History:      EST approximately 2002         Family History:      Sister had CABG in her 63s       Social History:      He is retired form working  for city.  Does yard work with tractor and rides motorcycles. Has 2 children.  Sees his grandkids regularly.        Right-handed.   Occasional caffeine use.      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 Ducatte, RN, BSN                                                                                                     Family History:    Family History  Problem Relation Age of Onset  . Cancer Mother        Posey Rea of type  . Heart attack Father   . Alzheimer's disease Father      ROS:  Please see the history of present illness.  ROS  All other ROS reviewed and negative.     Physical Exam/Data:   Vitals:   09/04/17 1300 09/04/17 1315 09/04/17 1330 09/04/17 1345  BP: 125/87 133/77 137/69 125/70  Pulse: 68 (!) 53 (!) 57 (!) 51  Resp: Temp:      TempSrc:      SpO2: 98% 98% 97% 99%   No intake or output data in the 24 hours ending 09/04/17 1409 There were no vitals filed for this visit. There is no height or  weight on file to calculate BMI.  General:  Well nourished, well developed, in no acute distress HEENT: normal Lymph: no adenopathy Neck: no JVD Endocrine:  No thryomegaly Vascular: No carotid bruits; FA pulses 2+ bilaterally without bruits  Cardiac:  normal S1, S2; RRR; no murmur  Lungs:  clear to auscultation bilaterally, no wheezing, rhonchi or rales  Abd: soft, nontender, no hepatomegaly  Ext: no edema Musculoskeletal:  No deformities, BUE and BLE strength normal and equal Skin: warm and dry  Neuro:  CNs 2-12 intact, no focal abnormalities noted Psych:  Normal affect   EKG:  The EKG was personally reviewed and demonstrates:  Normal sinus rhythm with first-degree AV block, 60 bpm, no acute ischemic changes Telemetry:  Telemetry was personally reviewed and demonstrates:  Sinus bradycardia in the upper 40s to low 50s  Relevant CV Studies:  Left heart catheterization 12/30/16  Prox Cx to Mid Cx lesion, 100 %stenosed.  Post intervention, there is a 0% residual stenosis.  A stent was successfully placed. Impression : Successful PCI and drug-eluting stenting of an occluded nondominant circumflex in the setting of a non-STEMI with dilatation of an obtuse marginal branch through the struts. Patient had TIMI-3 flow in both the circumflex and obtuse marginal branch. He tolerated the  procedure well. The guidewire and catheter were removed. Sheath was removed and a TR band was placed on the right wrist. Angiomax will continue full dose for 4 hours. He will be treated with aspirin, and high-dose statin drugs. He was tachycardic and therefore beta blocker was not started. A 2-D echo will be performed. He'll be fast tracked most likely discharged Sunday or Monday.     Post-Intervention Diagram        ----------------------------------------------------------------------------------------------------------------------  Echocardiogram 12/31/2016 Study Conclusions  - Left ventricle: The  cavity size was normal. Wall thickness was   normal. Systolic function was normal. The estimated ejection   fraction was in the range of 55% to 60%. Wall motion was normal;   there were no regional wall motion abnormalities. Left   ventricular diastolic function parameters were normal.  Laboratory Data:  Chemistry Recent Labs Lab 09/04/17 0841  NA 143  K 3.6  CL 108  CO2 29  GLUCOSE 110*  BUN 12  CREATININE 1.11  CALCIUM 9.1  GFRNONAA >60  GFRAA >60  ANIONGAP 6    No results for input(s): PROT, ALBUMIN, AST, ALT, ALKPHOS, BILITOT in the last 168 hours. Hematology Recent Labs Lab 09/04/17 0841  WBC 8.7  RBC 4.62  HGB 12.6*  HCT 41.2  MCV 89.2  MCH 27.3  MCHC 30.6  RDW 12.8  PLT 220   Cardiac EnzymesNo results for input(s): TROPONINI in the last 168 hours.  Recent Labs Lab 09/04/17 0903 09/04/17 1315  TROPIPOC 0.01 0.01    BNPNo results for input(s): BNP, PROBNP in the last 168 hours.  DDimer No results for input(s): DDIMER in the last 168 hours.  Radiology/Studies:  Dg Chest 2 View  Result Date: 09/04/2017 CLINICAL DATA:  Left-sided chest pain beginning last evening. Personal history of myocardial infarction and coronary stent. EXAM: CHEST  2 VIEW COMPARISON:  Two-view chest x-ray in 12/30/2016. FINDINGS: The heart size and mediastinal contours are within normal limits. Both lungs are clear. The visualized skeletal structures are unremarkable. IMPRESSION: No active cardiopulmonary disease. Electronically Signed   By: Marin Roberts M.D.   On: 09/04/2017 09:24    Assessment and Plan:   Chest pain -History of NSTEMI in 12/2016 S/P drug-eluting stent to the left circumflex coronary artery, currently on Brilinta 90 mg twice a day and aspirin 81 mg and statin. Patient says he has not missed any of his doses of Brilinta. Patient is not on a beta blocker due to bradycardia. -CVD risk factors include advanced age, dyslipidemia, male gender, smoking (quit 10  years ago) -EKG is without ischemic changes, chest x-ray is normal, troponins are negative 2 -The patient's left sided chest pain started with lifting his car hood with his left arm last night. He had no associated symptoms. He was able to go to sleep and this morning his left chest was more sore and he felt that the discomfort might  be similar to prior to his MI in January. His discomfort was continuous and finally resolved after he got to the ED and received aspirin. Prior to this he has had no exertional chest pain or shortness of breath. -There is no objective evidence of myocardial ischemia and his chest discomfort may be related to musculoskeletal soreness after lifting a car hood with his left arm. -Pt still has some soreness in his chest. Advise having the pt walk around in the ED and assess his tolerance. If no significant exertional symptoms may discharge to home. He should not take  any NSAIDS. Advise patient on symptoms to return to the ED such as exertional chest pain or chest pain associated with difficulty breathing. I will arrange for follow up in 1 week at our office.   Hyperlipidemia -Currently treated with pravastatin 40 mg daily. LDL in 12/2016 was 51 which is at goal of less than 70 -Continue current statin therapy   For questions or updates, please contact [facility] Please consult [email or web address] for contact info under Cardiology/STEMI.   Signed, Berton Bon, NP  09/04/2017 2:09 PM

## 2017-09-04 NOTE — ED Triage Notes (Signed)
Pt to ER states onset of left chest pressure last night while at rest. States does not radiate but feels similar to previous MI with stent placement. States has been compliant with medications. NAD at triage. VSS.

## 2017-09-04 NOTE — ED Notes (Signed)
ED Provider at bedside. 

## 2017-09-05 NOTE — Progress Notes (Signed)
[facility]  PATIENT: Dale [name] DOB: [birth year]   REASON FOR VISIT: Follow-up for memory loss HISTORY FROM: Patient and daughter    HISTORY OF PRESENT ILLNESS:Dale [name] is a 89 right-handed male, accompanied by his daughter Dale [name], seen in refer by his primary care doctor Dale [name] for evaluation of memory loss, initial evaluation was on December 12th 2017.  I reviewed and summarized the referring note, he had a history of hyperlipidemia, he graduate from 6th grade, he lives by himself since 2007, he still drives, does lawn care. His father had Alzheimer's disease at age 60s.   He was noted to have memory loss since 2015, loosing his keys, forget to turn his car key off, he made mistake paying bills, he denies gait abnormality, has good appetite, sleeps well.   I reviewed laboratory evaluations, normal folic acid B12, CBC, TSH, CMP  UPDATE March 06 2017:YY He is with his daughter at visit, he still lives by himself, able to does lawn care, he feels anxious sometime, difficulty with multitasking, his Mini-Mental Status Examination has decreased from previous 24 in December 2017 2 current 25 L he was admitted to hospital in January 2018 for now non-SETMI, echocardiogram showed ejection fraction 55-60%, wall motion was normal. Angiogram showed circumferential 100% stenosis, he had a stent placement. He is now on aspirin 81 mg and Brilinta 90 mg twice a day I reviewed laboratory evaluation in August 2017, vitamin B12 544, folic acid 14 point 7, TSH 4.17 I have personally reviewed MRI of the brain in January 2018, mild generalized atrophy, supratentorium small vessel disease. UPDATE 09/26/2018CM [name], 76 year old male returns for follow-up with history of memory loss. He continues to live alone, does his own cooking with family members checking in on him. He continues to drive and has not lost. He continues to mow his grass do his chores around  the house. He walks for exercise. He has not been doing any memory stimulating exercises. His father had Alzheimer's disease He returns for reevaluation   REVIEW OF SYSTEMS: Full 14 system review of systems performed and notable only for those listed, all others are neg:  Constitutional: neg  Cardiovascular: neg Ear/Nose/Throat: neg  Skin: neg Eyes: neg Respiratory: neg Gastroitestinal: neg  Hematology/Lymphatic: neg  Endocrine: neg Musculoskeletal:neg Allergy/Immunology: neg Neurological: Memory loss Psychiatric: neg Sleep : neg   ALLERGIES: No Known Allergies  HOME MEDICATIONS: Outpatient Medications Prior to Visit  Medication Sig Dispense Refill  . aspirin 81 MG chewable tablet Chew 1 tablet (81 mg total) by mouth daily. 30 tablet 3  . calcium carbonate (TUMS) 500 MG chewable tablet Chew 1 tablet (200 mg of elemental calcium total) by mouth 3 (three) times daily as needed for indigestion or heartburn.    . donepezil (ARICEPT) 10 MG tablet Take 1 tablet (10 mg total) by mouth at bedtime. 90 tablet 4  . loratadine (CLARITIN) 10 MG tablet Take 10 mg by mouth daily.    . memantine (NAMENDA) 10 MG tablet Take 1 tablet (10 mg total) by mouth 2 (two) times daily. 180 tablet 4  . nitroGLYCERIN (NITROSTAT) 0.4 MG SL tablet Place 1 tablet (0.4 mg total) under the tongue every 5 (five) minutes x 3 doses as needed for chest pain. 25 tablet 12  . pantoprazole (PROTONIX) 40 MG tablet Take 1 tablet (40 mg total) by mouth daily. 30 tablet 11  . pravastatin (PRAVACHOL) 40 MG tablet TAKE 1 TABLET(40 MG) BY MOUTH DAILY 90  tablet 5  . tamsulosin (FLOMAX) 0.4 MG CAPS capsule TAKE 1 CAPSULE(0.4 MG) BY MOUTH DAILY AFTER SUPPER 90 capsule 2  . ticagrelor (BRILINTA) 90 MG TABS tablet Take 1 tablet (90 mg total) by mouth 2 (two) times daily. 180 tablet 3  . cyclobenzaprine (FLEXERIL) 5 MG tablet Take 1 tablet (5 mg total) by mouth 2 (two) times daily as needed for muscle spasms. (Patient not taking:  Reported on 09/06/2017) 10 tablet 0   No facility-administered medications prior to visit.     PAST MEDICAL HISTORY: Past Medical History:  Diagnosis Date  . Coronary artery disease   . Dementia   . GERD (gastroesophageal reflux disease)   . Hyperlipemia   . Leg mass    rt  . Memory loss   . Myocardial infarction less than 4 weeks ago (HCC) 12/29/2016  . Prostate disease   . S/P angioplasty with stent to LCX     PAST SURGICAL HISTORY: Past Surgical History:  Procedure Laterality Date  . CARDIAC CATHETERIZATION N/A 12/30/2016   Procedure: Coronary Stent Intervention;  Surgeon: Runell Gess, MD;  Location: [facility] INVASIVE CV LAB;  Service: Cardiovascular;  Laterality: N/A;  . CARDIAC CATHETERIZATION N/A 12/30/2016   Procedure: Coronary Angiography;  Surgeon: Runell Gess, MD;  Location: [facility] INVASIVE CV LAB;  Service: Cardiovascular;  Laterality: N/A;  . CARDIAC CATHETERIZATION N/A 12/30/2016   Procedure: Coronary Balloon Angioplasty;  Surgeon: Runell Gess, MD;  Location: [facility] INVASIVE CV LAB;  Service: Cardiovascular;  Laterality: N/A;    FAMILY HISTORY: Family History  Problem Relation Age of Onset  . Cancer Mother        Posey Rea of type  . Heart attack Father   . Alzheimer's disease Father     SOCIAL HISTORY: Social History   Social History  . Marital status: Widowed    Spouse name: N/A  . Number of children: 2  . Years of education: 6th   Occupational History  . Retired    Social History Main Topics  . Smoking status: Former Smoker    Packs/day: 1.00    Years: 20.00    Types: Cigarettes    Quit date: 10/23/2011  . Smokeless tobacco: Never Used  . Alcohol use No     Comment: occ  . Drug use: No  . Sexual activity: Not Currently     Comment: Patient would like to be. Discussed wearing condom if becomes sexually active   Other Topics Concern  . Not on file   Social History Narrative   Past Medical History:      EST approximately 2002          Family History:      Sister had CABG in her 59s       Social History:      He is retired frmm working for city.  Does yard work with tractor and rides motorcycles. Has 2 children.  Sees his grandkids regularly.        Right-handed.   Occasional caffeine use.      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 Ducatte, RN, BSN                                                                                                      PHYSICAL EXAM  Vitals:   09/06/17 1004  BP: 117/68  Pulse: (!) 55  Weight: 150 lb 6.4 oz (68.2 kg)   Body mass index is 25.82 kg/m.  Generalized: Well developed, in no acute distress , well-groomed Head: normocephalic and atraumatic,. Oropharynx benign  Neck: Supple,  Cardiac: Regular rate rhythm, no murmur  Musculoskeletal: No deformity   Neurological examination   Mentation: Alert  MMSE - Mini Mental State Exam 09/06/2017 03/06/2017 11/22/2016  Orientation to time Orientation to Place Registration Attention/ Calculation Recall Language- name 2 objects Language- repeat 0 1 1  Language- follow 3 step command Language- read & follow direction Write a sentence Copy design 0 0 1  Total score AFT 9. Clock drawing 1/4  Follows all commands speech and language fluent.   Cranial nerve II-XII: Pupils were equal round reactive to light extraocular movements were full, visual field were full on confrontational test. Facial sensation  and strength were normal. hearing was intact to finger rubbing bilaterally. Uvula tongue midline. head turning and shoulder shrug were normal and symmetric.Tongue protrusion into cheek strength was normal. Motor: normal bulk and tone, full strength in the BUE, BLE, fine finger movements normal, no pronator drift. No focal weakness Sensory: normal and symmetric to light touch, in the upper and lower extremities Coordination: finger-nose-finger, heel-to-shin bilaterally, no dysmetria Reflexes: Symmetric upper and lower, plantar responses were flexor bilaterally. Gait and Station: Rising up from seated position without assistance, normal stance,  moderate stride, good arm swing, smooth turning, able to perform tiptoe, and heel walking without difficulty. Tandem gait is steady. No assistive device  DIAGNOSTIC DATA (LABS, IMAGING, TESTING) - I reviewed patient records, labs, notes, testing and imaging myself where available.  Lab Results  Component Value Date   WBC 8.7 09/04/2017   HGB 12.6 (L) 09/04/2017   HCT 41.2 09/04/2017   MCV 89.2 09/04/2017   PLT 220 09/04/2017      Component Value Date/Time   NA 143 09/04/2017 0841   NA 143 05/16/2017 1106   K 3.6 09/04/2017 0841   CL 108 09/04/2017 0841   CO2 29 09/04/2017 0841   GLUCOSE 110 (H) 09/04/2017 0841   BUN 12 09/04/2017 0841   BUN 12 05/16/2017 1106   CREATININE 1.11 09/04/2017 0841   CREATININE 1.10 01/09/2017 1611   CALCIUM 9.1 09/04/2017 0841   PROT 6.4 05/16/2017 1106   ALBUMIN 4.0 05/16/2017 1106   AST 16 05/16/2017 1106   ALT 18 05/16/2017 1106   ALKPHOS 114 05/16/2017 1106   BILITOT 0.4  05/16/2017 1106   GFRNONAA >60 09/04/2017 0841   GFRAA >60 09/04/2017 0841   Lab Results  Component Value Date   CHOL 119 12/31/2016   HDL 60 12/31/2016   LDLCALC 51 12/31/2016   TRIG 41 12/31/2016   CHOLHDL 2.0 12/31/2016     ASSESSMENT AND PLAN Dale [name] is a 76 y.o. male  With diagnosis of mild cognitive impairment with  progressive worsening. Mini-Mental Status exam today 19/30. Previous 25/30. There is a family history of Alzheimer's dementia MRI of the brain, showed generalized atrophy   PLAN: Continue Aricept at current dose Continue Namenda at current dose Perform cognitively stimulating exercises to stimulate memory Patient is not interested in research study Follow-up in 6 months Walk daily for exercise I spent 25 minutes in total face to face time with the patient more than 50% of which was spent counseling and coordination of care, reviewing test results reviewing medications and discussing and reviewing the diagnosis of progressive Alzheimer's dementia, memory stimulating exercises, importance of safety measures Nilda Riggs, [facility], [facility], APRN  [facility] [address] [place], [place] [address] [phone]

## 2017-09-06 ENCOUNTER — Encounter: Payer: Self-pay | Admitting: Nurse Practitioner

## 2017-09-06 ENCOUNTER — Encounter: Payer: Self-pay | Admitting: *Deleted

## 2017-09-06 ENCOUNTER — Ambulatory Visit (INDEPENDENT_AMBULATORY_CARE_PROVIDER_SITE_OTHER): Payer: Medicare HMO | Admitting: Nurse Practitioner

## 2017-09-06 VITALS — BP 117/68 | HR 55 | Wt 150.4 lb

## 2017-09-06 DIAGNOSIS — G309 Alzheimer's disease, unspecified: Secondary | ICD-10-CM

## 2017-09-06 DIAGNOSIS — F0280 Dementia in other diseases classified elsewhere without behavioral disturbance: Secondary | ICD-10-CM

## 2017-09-06 NOTE — Patient Instructions (Signed)
Continue Aricept at current dose Continue Namenda at current dose Perform cognitively stimulating exercises to stimulate memory Patient is not interested in research study Follow-up in 6 months Walk daily for exercise

## 2017-09-06 NOTE — Progress Notes (Signed)
I have reviewed this visit and discussed with Alecia Lemming, RN, BSN, and agree with I have reviewed this visit and discussed with Alecia Lemming, RN, BSN, and agree with her documentation. her documentation.

## 2017-09-07 NOTE — Progress Notes (Signed)
I have reviewed and agreed above plan. 

## 2017-09-12 ENCOUNTER — Ambulatory Visit: Payer: Medicare HMO | Admitting: Cardiology

## 2017-09-20 ENCOUNTER — Ambulatory Visit: Payer: Medicare HMO | Admitting: Family Medicine

## 2017-09-25 ENCOUNTER — Ambulatory Visit: Payer: Medicare HMO | Admitting: Physician Assistant

## 2017-09-27 ENCOUNTER — Encounter: Payer: Self-pay | Admitting: Physician Assistant

## 2017-09-27 ENCOUNTER — Ambulatory Visit (INDEPENDENT_AMBULATORY_CARE_PROVIDER_SITE_OTHER): Payer: Medicare HMO | Admitting: Physician Assistant

## 2017-09-27 VITALS — BP 118/69 | HR 58 | Ht 64.0 in | Wt 149.8 lb

## 2017-09-27 DIAGNOSIS — E785 Hyperlipidemia, unspecified: Secondary | ICD-10-CM | POA: Diagnosis not present

## 2017-09-27 DIAGNOSIS — I2510 Atherosclerotic heart disease of native coronary artery without angina pectoris: Secondary | ICD-10-CM

## 2017-09-27 NOTE — Progress Notes (Signed)
Cardiology Office Note    Date:  09/29/2017   ID:  Dale [name], DOB 31-Dec-1940, MRN [id number]  PCP:  Dale Living, MD  Cardiologist:  Dr. Allyson [name]  Chief Complaint  Patient presents with  . Follow-up    seen for Dr. Allyson [name]    History of Present Illness:  Dale [name] is a 76 y.o. male with PMH of former smoker, CAD and HLD. Cardiac catheterization obtained on 12/30/2016 showed one vessel disease with occluded nondominant left circumflex, and this was treated with drug-eluting stent. Last echocardiogram obtained on 12/31/2016 showed EF 55-60%, normal wall motion. He is on aspirin and Brilinta. He was seen by Dr. Katrinka [name] in the emergency room, troponin negative 2. His chest pain was felt to be musculoskeletal in nature and he was discharged from the ED.  She denies any further chest discomfort since she left the hospital. He has been ambulating and does occasional jogging as well without exertional symptom. He will continue to observe for recurrent chest pain, if he does have recurrent chest pain, we can consider stress test. Otherwise he has no lower extremity edema, orthopnea or PND. He can follow-up with Dr. Allyson [name] in 3 month.   Past Medical History:  Diagnosis Date  . Coronary artery disease   . Dementia   . GERD (gastroesophageal reflux disease)   . Hyperlipemia   . Leg mass    rt  . Memory loss   . Myocardial infarction less than 4 weeks ago (HCC) 12/29/2016  . Prostate disease   . S/P angioplasty with stent to LCX     Past Surgical History:  Procedure Laterality Date  . CARDIAC CATHETERIZATION N/A 12/30/2016   Procedure: Coronary Stent Intervention;  Surgeon: Dale Gess, MD;  Location: [facility] INVASIVE CV LAB;  Service: Cardiovascular;  Laterality: N/A;  . CARDIAC CATHETERIZATION N/A 12/30/2016   Procedure: Coronary Angiography;  Surgeon: Dale Gess, MD;  Location: [facility] INVASIVE CV LAB;  Service: Cardiovascular;  Laterality: N/A;  . CARDIAC CATHETERIZATION N/A  12/30/2016   Procedure: Coronary Balloon Angioplasty;  Surgeon: Dale Gess, MD;  Location: [facility] INVASIVE CV LAB;  Service: Cardiovascular;  Laterality: N/A;    Current Medications: Outpatient Medications Prior to Visit  Medication Sig Dispense Refill  . aspirin 81 MG chewable tablet Chew 1 tablet (81 mg total) by mouth daily. 30 tablet 3  . calcium carbonate (TUMS) 500 MG chewable tablet Chew 1 tablet (200 mg of elemental calcium total) by mouth 3 (three) times daily as needed for indigestion or heartburn.    . donepezil (ARICEPT) 10 MG tablet Take 1 tablet (10 mg total) by mouth at bedtime. 90 tablet 4  . loratadine (CLARITIN) 10 MG tablet Take 10 mg by mouth daily.    . memantine (NAMENDA) 10 MG tablet Take 1 tablet (10 mg total) by mouth 2 (two) times daily. 180 tablet 4  . nitroGLYCERIN (NITROSTAT) 0.4 MG SL tablet Place 1 tablet (0.4 mg total) under the tongue every 5 (five) minutes x 3 doses as needed for chest pain. 25 tablet 12  . pantoprazole (PROTONIX) 40 MG tablet Take 1 tablet (40 mg total) by mouth daily. 30 tablet 11  . pravastatin (PRAVACHOL) 40 MG tablet TAKE 1 TABLET(40 MG) BY MOUTH DAILY 90 tablet 5  . tamsulosin (FLOMAX) 0.4 MG CAPS capsule TAKE 1 CAPSULE(0.4 MG) BY MOUTH DAILY AFTER SUPPER 90 capsule 2  . ticagrelor (BRILINTA) 90 MG TABS tablet Take 1 tablet (90 mg total) by  mouth 2 (two) times daily. 180 tablet 3  . cyclobenzaprine (FLEXERIL) 5 MG tablet Take 1 tablet (5 mg total) by mouth 2 (two) times daily as needed for muscle spasms. (Patient not taking: Reported on 09/06/2017) 10 tablet 0   No facility-administered medications prior to visit.      Allergies:   Patient has no known allergies.   Social History   Social History  . Marital status: Widowed    Spouse name: N/A  . Number of children: 2  . Years of education: 6th   Occupational History  . Retired    Social History Main Topics  . Smoking status: Former Smoker    Packs/day: 1.00    Years: 20.00     Types: Cigarettes    Quit date: 10/23/2011  . Smokeless tobacco: Never Used  . Alcohol use No     Comment: occ  . Drug use: No  . Sexual activity: Not Currently     Comment: Patient would like to be. Discussed wearing condom if becomes sexually active   Other Topics Concern  . None   Social History Narrative   Past Medical History:      EST approximately 2002         Family History:      Sister had CABG in her 50s       Social History:      He is retired frmm working for city.  Does yard work with tractor and rides motorcycles. Has 2 children.  Sees his grandkids regularly.        Right-handed.   Occasional caffeine use.      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 Ducatte, RN, BSN                                                                                                      Family History:  The patient's family history includes Alzheimer's disease in his father; Cancer in his mother; Heart attack in his father.   ROS:   Please see the history of present illness.    ROS All other systems reviewed and are negative.   PHYSICAL EXAM:   VS:  BP 118/69   Pulse (!) 58   Ht 5\' 4"  (1.626 m)   Wt 149 lb 12.8 oz (67.9 kg)   BMI 25.71 kg/m    GEN: Well nourished, well developed, in no acute distress  HEENT: normal  Neck: no JVD, carotid bruits, or masses Cardiac: RRR; no murmurs, rubs, or gallops,no edema  Respiratory:  clear to auscultation bilaterally,  normal work of breathing GI: soft, nontender,  nondistended, + BS MS: no deformity or atrophy  Skin: warm and dry, no rash Neuro:  Alert and Oriented x 3, Strength and sensation are intact Psych: euthymic mood, full affect  Wt Readings from Last 3 Encounters:  09/27/17 149 lb 12.8 oz (67.9 kg)  09/06/17 150 lb 6.4 oz (68.2 kg)  08/30/17 150 lb 6.4 oz (68.2 kg)      Studies/Labs Reviewed:   EKG:  EKG is not ordered today.    Recent Labs: 05/16/2017: ALT 18 09/04/2017: BUN 12; Creatinine, Ser 1.11; Hemoglobin 12.6; Platelets 220; Potassium 3.6; Sodium 143   Lipid Panel    Component Value Date/Time   CHOL 119 12/31/2016 0152   TRIG 41 12/31/2016 0152   HDL 60 12/31/2016 0152   CHOLHDL 2.0 12/31/2016 0152   VLDL 8 12/31/2016 0152   LDLCALC 51 12/31/2016 0152    Additional studies/ records that were reviewed today include:   Cath 12/30/2016 Conclusion     Prox Cx to Mid Cx lesion, 100 %stenosed.  Post intervention, there is a 0% residual stenosis.  A stent was successfully placed.      Echo 12/31/2016 LV EF: 55% -   60%  Study Conclusions  - Left ventricle: The cavity size was normal. Wall thickness was   normal. Systolic function was normal. The estimated ejection   fraction was in the range of 55% to 60%. Wall motion was normal;   there were no regional wall motion abnormalities. Left   ventricular diastolic function parameters were normal.    ASSESSMENT:    1. Coronary artery disease involving native coronary artery of native heart without angina pectoris   2. Hyperlipidemia, unspecified hyperlipidemia type      PLAN:  In order of problems listed above:  1. CAD: No further chest pain since recent hospitalization, will not pursue any further workup at this time unless any recurrence. Continue aspirin and Brilinta.  2. Hyperlipidemia: Continue Pravachol 40 mg daily    Medication Adjustments/Labs and Tests Ordered: Current medicines are reviewed at length with the patient today.  Concerns  regarding medicines are outlined above.  Medication changes, Labs and Tests ordered today are listed in the Patient Instructions below. Patient Instructions  Your physician recommends that you continue on your current medications as directed. Please refer to the Current Medication list given to you today.  Your physician recommends that you schedule a follow-up appointment in: THREE MONTHS with Dr. Allyson SabalBerry      Signed, Azalee CourseHao Stephnie Parlier, [place]  09/29/2017 9:08 AM    [facility] [address], [place], [place]  [address] Phone: [phone]; Fax: [phone]

## 2017-09-27 NOTE — Patient Instructions (Signed)
Your physician recommends that you continue on your current medications as directed. Please refer to the Current Medication list given to you today.  Your physician recommends that you schedule a follow-up appointment in: THREE MONTHS with Dr. Allyson Sabal

## 2017-09-29 ENCOUNTER — Encounter: Payer: Self-pay | Admitting: Physician Assistant

## 2017-10-11 ENCOUNTER — Ambulatory Visit: Payer: Medicare HMO | Admitting: Family Medicine

## 2017-10-16 ENCOUNTER — Ambulatory Visit (INDEPENDENT_AMBULATORY_CARE_PROVIDER_SITE_OTHER): Payer: Medicare HMO | Admitting: Family Medicine

## 2017-10-16 VITALS — BP 130/62 | HR 89 | Temp 99.6°F | Wt 149.0 lb

## 2017-10-16 DIAGNOSIS — L509 Urticaria, unspecified: Secondary | ICD-10-CM

## 2017-10-16 DIAGNOSIS — R21 Rash and other nonspecific skin eruption: Secondary | ICD-10-CM

## 2017-10-16 MED ORDER — CAMPHOR-MENTHOL 0.5-0.5 % EX LOTN: 1.0000 "application " | TOPICAL_LOTION | CUTANEOUS | 0 refills | Status: DC | PRN

## 2017-10-16 MED ORDER — NIZATIDINE 150 MG PO CAPS: 150.0000 mg | ORAL_CAPSULE | Freq: Two times a day (BID) | ORAL | 1 refills | Status: DC

## 2017-10-16 MED ORDER — PREDNISONE 20 MG PO TABS: 40.0000 mg | ORAL_TABLET | Freq: Every day | ORAL | 1 refills | Status: DC

## 2017-10-16 NOTE — Assessment & Plan Note (Signed)
New problem. Very Pruritic. Further workup with CBC w/diff, CMET, and ESR.  Working dx of acute urticaria, though it has persisted > 48 hours and there was a circular erythematous with dark curve edge over left medial bicep area of skin which rasies concerns for an acute hypersensitive vasculitis condition.   Advised on return precautions including difficulty breathing, worsening of rash, fever.   Start Prednisone 40 mg daily for 7 days, may repeat if condition persists or restart if condition recurs. Claritan 10 mg daily for for 7 days H2-blocker OTC Sarna lotion prn  RTC 2 days to assess response to empiric treatment and revisit working diagnosis.

## 2017-10-16 NOTE — Progress Notes (Signed)
   Subjective:    Patient ID: Dale [name], male    DOB: 10-16-1941, 76 y.o.   MRN: [id number] Dale [name] is accompanied by daughter Sources of clinical information for visit is/are patient, relative(s) and past medical records. Nursing assessment for this office visit was reviewed with the patient for accuracy and revision.  Depression screen PHQ 2/9 08/30/2017  Decreased Interest 0  Down, Depressed, Hopeless 0  PHQ - 2 Score 0   Fall Risk  09/06/2017 08/30/2017 08/16/2017 04/05/2017 02/28/2017  Falls in the past year? No No No No No    HPI  Onset: 11/2, Friday : Location generalized arms, neck, torso (front and back), bilateral groin and left foreleg Associated: Very Itchy, malaise Context: Exposure day before onset in dusty shed.   Primary Lesion (Macule/Nodule/Papule/Vesicule/Pustule/Wheal/Petechia) Wheal-like, blushes Lesion Color (Skin/Red/White/Brown-Black/Yellow) red Solitary Lesion (Yes/No) no Secondary Lesion   No lesions on palms or soles. Lower lip mildly edematous  Mild erythem soft palate No conjunctival injection, mild bilateral eyelid swelling Special Lesion (Comedome/Milia/Telangiectasia/Burrows) No targetoid lesions Dark semicircular edge of an erythematous lesion anteromedial proximal armn  Generalized distribution  Blushing (Yes/No): yes  Topology (Flat-topped/Domed/Filiform/Pedunculated/Smooth/Verrucous/Umbilicated) Smooth  SH: no smoking  Review of Systems No fever No N/V/d  No immunosuppressive medications    Objective:   Physical Exam VS reviewed GEN: Alert, Cooperative, Groomed, NAD COR: RRR, No M/G/R, No JVD, Normal PMI size and location LUNGS: BCTA, No Acc mm use, speaking in full sentences ABDOMEN: (+)BS, soft, NT, ND, No HSM, No palpable masses EXT: mild left erythema Feet without lesions.  SKIN: see above.  Neuro: Oriented to person, place, and time; Strength: 5/5 Bil. UE and LE symmetric;  Gait: slightly subnormal speed, No  significant path deviation, Step through +,  Psych: Normal affect/thought/speech/language    Assessment & Plan:  See problem list

## 2017-10-16 NOTE — Patient Instructions (Addendum)
I believe you may have a condition caller urticaria or its cousin illness called erythema multiforme.  Both conditions are allergic reactions, possibly to something you came in contact with in the shed.    To shut down the allergic reaction, take the Prednisone, Claritan (loratadine), and stomach acid blocker medicine for 7 days.  If the rash or itching flares back up, start all three medications for another week.    Use the Sarna lotion to help decrease the itching immediately.  You can use it as often as you like for the itching.

## 2017-10-17 ENCOUNTER — Telehealth: Payer: Self-pay | Admitting: Family Medicine

## 2017-10-17 NOTE — Telephone Encounter (Signed)
Pt was in yesterday and was given 3 medications. The pharmacy was out of the nizatidine and said they had to order more. Pt is now itching really bad, which wasn't happening yesterday. Wants to know if he can get something for the itching. Call pt back at [phone].

## 2017-10-18 ENCOUNTER — Ambulatory Visit (HOSPITAL_COMMUNITY)
Admission: RE | Admit: 2017-10-18 | Discharge: 2017-10-18 | Disposition: A | Discharge status: Home / Self Care | Payer: Medicare HMO | Source: Ambulatory Visit | Attending: Family Medicine | Admitting: Family Medicine

## 2017-10-18 ENCOUNTER — Other Ambulatory Visit: Payer: Self-pay

## 2017-10-18 ENCOUNTER — Ambulatory Visit (INDEPENDENT_AMBULATORY_CARE_PROVIDER_SITE_OTHER): Payer: Medicare HMO | Admitting: Internal Medicine

## 2017-10-18 ENCOUNTER — Encounter: Payer: Self-pay | Admitting: Internal Medicine

## 2017-10-18 VITALS — BP 110/62 | HR 55 | Temp 98.2°F | Ht 64.0 in | Wt 149.0 lb

## 2017-10-18 DIAGNOSIS — M7989 Other specified soft tissue disorders: Secondary | ICD-10-CM | POA: Insufficient documentation

## 2017-10-18 DIAGNOSIS — R21 Rash and other nonspecific skin eruption: Secondary | ICD-10-CM | POA: Diagnosis not present

## 2017-10-18 DIAGNOSIS — R590 Localized enlarged lymph nodes: Secondary | ICD-10-CM | POA: Insufficient documentation

## 2017-10-18 NOTE — Progress Notes (Signed)
LLE venous duplex prelim: negative for DVT. Farrel Demark, RDMS, RVT  Called results to Dr. Cathlean Cower.

## 2017-10-18 NOTE — Assessment & Plan Note (Addendum)
Left lower extremity swelling possibly due to rash.  Obtained a lower extremity Doppler which was negative for blood clots.  Was paged by ultrasound staff to let me know the this was negative.  Given it is not warm or painful to the touch less concerning for cellulitis

## 2017-10-18 NOTE — Progress Notes (Signed)
   [name] Kerrin Mo, MD Phone: [phone]  Reason For Visit: Follow-up for rash  #Patient states that last Thursday he developed a rash.  He denies any new medications or creams.  He states that this happened after he went into his dusty shed.  It worsened over the past several days.  It was itchy.  It started on his chest and moved to his legs, arms and down his buttocks. Spared his palms and soles. He states he has had some pain in his left leg but this has gone away. Patient notes swelling in left leg since Sunday. He was seen by Dr. McDiarmid on the 5th.  He states that on Saturday he had lip swelling and face swelling, this resolved by Sunday.  He denies any shortness of breath nausea vomiting.   He denies any ulcers on his mouth.  He notes his lips are dry he denies any current swelling in his lips.  Dr. McDiarmid thought that this was acute urticaria and advised him to start prednisone, Sarna lotion, tizandine.  Patient has been taking all 3 of these medications as prescribed. He states that since that time the rash has improved some.   Past Medical History Reviewed problem list.  Medications- reviewed and updated No additions to family history Social history- patient is a non- smoker  Objective: BP 110/62   Pulse (!) 55   Temp 98.2 F (36.8 C) (Oral)   Ht '5\' 4"'$  (1.626 m)   Wt 149 lb (67.6 kg)   SpO2 99%   BMI 25.58 kg/m  Gen: NAD, alert, cooperative with exam HEENT: Normal    Neck: No masses palpated. No lymphadenopathy    Nose: nasal turbinates moist    Throat: moist mucus membranes, no erythema-no ulcerations in mouth Cardio: regular rate and rhythm, S1S2 heard, no murmurs appreciated Pulm: clear to auscultation bilaterally, no wheezes, rhonchi or rales Skin: erythematous macules, with areas of confluence noted on the chest back arms and to a smaller extent on the legs, slight excoriation marks noted, patient denies these erythematous lesions  changing or moving around. Left lower extremity with 2+ pitting edema, confluence of redness on left lower extremity compared to the right though not warm to the touch.  Not painful.  No calf tenderness, no pain with dorsiflexion  Assessment/Plan:  See problem based a/p  Rash and nonspecific skin eruption Maculolar-papular rash with areas of confluence.  Possibly a allergic reaction the patient has not taken any medications or used any new products.  Possibly due to his walking the dusty shed.  Symptoms are improving on regimen that Dr. McDiarmid prescribed of prednisone tizanidine and Sarna lotion.  Patient denies any ulcers in his mouth or lesions on his hands or feet.  Unlikely severe erythema multiforme, though possibly erythema multiforme minore or possibly dress syndrome.  -Continue current medication regimen - Will obtain CMET, CBC and ESR   Swelling of lower leg Left lower extremity swelling possibly due to rash.  Obtained a lower extremity Doppler which was negative for blood clots.  Was paged by ultrasound staff to let me know the this was negative.  Given it is not warm or painful to the touch less concerning for cellulitis

## 2017-10-18 NOTE — Assessment & Plan Note (Addendum)
Maculolar-papular rash with areas of confluence.  Possibly a allergic reaction the patient has not taken any medications or used any new products.  Possibly due to his walking the dusty shed.  Symptoms are improving on regimen that Dr. McDiarmid prescribed of prednisone tizanidine and Sarna lotion.  Patient denies any ulcers in his mouth or lesions on his hands or feet.  Unlikely severe erythema multiforme, though possibly erythema multiforme minore or possibly dress syndrome.  -Continue current medication regimen - Will obtain CMET, CBC and ESR  - Follow up in 1 week

## 2017-10-18 NOTE — Patient Instructions (Signed)
Please continue the current medical regimen that we discussed.  I want you to get a ultrasound immediately after you leave the clinic today.  To rule out a clot that might be in in your left lower leg.  I am also going to do some lab work today that we were supposed to do at your last visit.  Please follow-up in 1 week.

## 2017-10-18 NOTE — Telephone Encounter (Signed)
Dale [name] has a scheduled follow up for rash on 11/7.  He does not appear to have his recommended labs drawn during office visit with me on 11/5. Will await reassessment of his rash and condition to see what may be appropriate intervention for his symptoms.

## 2017-10-19 ENCOUNTER — Telehealth: Payer: Self-pay | Admitting: Internal Medicine

## 2017-10-19 LAB — CMP14+EGFR
A/G RATIO: 1.5 (ref 1.2–2.2)
ALBUMIN: 4.1 g/dL (ref 3.5–4.8)
ALT: 173 IU/L — ABNORMAL HIGH (ref 0–44)
AST: 89 IU/L — ABNORMAL HIGH (ref 0–40)
Alkaline Phosphatase: 151 IU/L — ABNORMAL HIGH (ref 39–117)
BILIRUBIN TOTAL: 0.4 mg/dL (ref 0.0–1.2)
BUN / CREAT RATIO: 12 (ref 10–24)
BUN: 12 mg/dL (ref 8–27)
CHLORIDE: 102 mmol/L (ref 96–106)
CO2: 26 mmol/L (ref 20–29)
Calcium: 9.1 mg/dL (ref 8.6–10.2)
Creatinine, Ser: 1.01 mg/dL (ref 0.76–1.27)
GFR, EST AFRICAN AMERICAN: 84 mL/min/{1.73_m2} (ref >59)
GFR, EST NON AFRICAN AMERICAN: 73 mL/min/{1.73_m2} (ref >59)
GLOBULIN, TOTAL: 2.7 g/dL (ref 1.5–4.5)
Glucose: 100 mg/dL — ABNORMAL HIGH (ref 65–99)
POTASSIUM: 4.2 mmol/L (ref 3.5–5.2)
Sodium: 144 mmol/L (ref 134–144)
TOTAL PROTEIN: 6.8 g/dL (ref 6.0–8.5)

## 2017-10-19 LAB — CBC
HEMATOCRIT: 42.1 % (ref 37.5–51.0)
HEMOGLOBIN: 13 g/dL (ref 13.0–17.7)
MCH: 27.6 pg (ref 26.6–33.0)
MCHC: 30.9 g/dL — ABNORMAL LOW (ref 31.5–35.7)
MCV: 89 fL (ref 79–97)
Platelets: 274 10*3/uL (ref 150–379)
RBC: 4.71 x10E6/uL (ref 4.14–5.80)
RDW: 12.8 % (ref 12.3–15.4)
WBC: 15.9 10*3/uL — AB (ref 3.4–10.8)

## 2017-10-19 LAB — SEDIMENTATION RATE: Sed Rate: 29 mm/hr (ref 0–30)

## 2017-10-19 NOTE — Telephone Encounter (Signed)
See previous telephone note. 

## 2017-10-19 NOTE — Telephone Encounter (Signed)
Tried to call patient to let him know about the results of his lab work, left message letting him know that he should call the clinic back. If patient calls please make an appointment for him to be seen tomorrow. He will need repeat LFTs and hepatitis panel obtained tomorrow. Please let patient know if he calls back I would like to come in for a visit. Red team if you can try to get in touch with him to set up an appointment for tomorrow that would be great.

## 2017-10-19 NOTE — Telephone Encounter (Signed)
appt made for tomorrow. Dale [name], [name], CMA

## 2017-10-20 ENCOUNTER — Other Ambulatory Visit: Payer: Self-pay

## 2017-10-20 ENCOUNTER — Encounter: Payer: Self-pay | Admitting: Internal Medicine

## 2017-10-20 ENCOUNTER — Ambulatory Visit (INDEPENDENT_AMBULATORY_CARE_PROVIDER_SITE_OTHER): Payer: Medicare HMO | Admitting: Internal Medicine

## 2017-10-20 VITALS — BP 98/58 | HR 46 | Temp 98.1°F | Wt 149.0 lb

## 2017-10-20 DIAGNOSIS — R7989 Other specified abnormal findings of blood chemistry: Secondary | ICD-10-CM

## 2017-10-20 DIAGNOSIS — R21 Rash and other nonspecific skin eruption: Secondary | ICD-10-CM

## 2017-10-20 DIAGNOSIS — R945 Abnormal results of liver function studies: Secondary | ICD-10-CM

## 2017-10-20 MED ORDER — DIPHENHYDRAMINE HCL 2 % EX CREA: TOPICAL_CREAM | Freq: Three times a day (TID) | CUTANEOUS | 0 refills | Status: DC | PRN

## 2017-10-20 MED ORDER — PREDNISONE 20 MG PO TABS: 20.0000 mg | ORAL_TABLET | Freq: Every day | ORAL | 0 refills | Status: DC

## 2017-10-20 NOTE — Patient Instructions (Addendum)
Is nice seeing you today.  I am going to recheck your labs.  I want you to continue taking prednisone.  Take 20 mg daily after you are finished with your 7 days for 3 days and then take half a pill for another 4 days.  Please follow-up with me next week at the beginning of the week.  I have also prescribed you Benadryl cream.

## 2017-10-20 NOTE — Progress Notes (Signed)
   [name] Noralee Chars, MD Phone: [phone]  Reason For Visit: Follow-up appointment  #Rash continues to improve.  Patient still is having significant itching especially with heat.The swelling in his left leg is improving.  Though it is still present.  He feels overall he is getting better.  The rash on his neck which is where the rash initially started has peeled away.  Denies any fevers.  Denies any ulcers in mouth.  Denies any new worsening symptoms.  Everything seems to be resolving.  Past Medical History Reviewed problem list.  Medications- reviewed and updated No additions to family history Social history- patient is a non smoker  Objective: BP (!) 98/58   Pulse (!) 46   Temp 98.1 F (36.7 C) (Oral)   Wt 149 lb (67.6 kg)   SpO2 98%   BMI 25.58 kg/m  Gen: NAD, alert, cooperative with exam HEENT: Normal    Neck: No masses palpated. No lymphadenopathy    Mouth: No ulcers in mucous membranes.  Patient complains of dry lips  Extremities: Left leg still slightly swollen compared to right though significantly improved. Skin: Lesions on her neck have almost completely resolved now with flaking/peeling skin, lesions on chest resolving small erythematous macules with small scabs   Assessment/Plan: See problem based a/p  Rash and nonspecific skin eruption Improving rash,  Some areas of resolution including neck where rash initially started.  Patient with elevated LFTs and leukocytosis previously.  Leukocytosis possibly from prednisone.  Will repeat labs today.  Patient to have a prednisone taper following 7-day course. - CBC with Differential/Platelet - predniSONE (DELTASONE) 20 MG tablet;-will have patient do 3 days of 20 mg daily and then 4 days of 10 mg daily.  For a total of 14 days of steroids -Provided patient with Benadryl cream to help with itching will hold off on his steroid cream given we are providing oral steroids please - Hepatitis  panel, acute -Patient to follow-up next week

## 2017-10-20 NOTE — Assessment & Plan Note (Addendum)
Improving rash,  Some areas of resolution including neck where rash initially started.  Patient with elevated LFTs and leukocytosis previously.  Leukocytosis possibly from prednisone.  Will repeat labs today.  Patient to have a prednisone taper following 7-day course. - CBC with Differential/Platelet - predniSONE (DELTASONE) 20 MG tablet;-will have patient do 3 days of 20 mg daily and then 4 days of 10 mg daily.  For a total of 14 days of steroids -Provided patient with Benadryl cream to help with itching will hold off on his steroid cream given we are providing oral steroids please - Hepatitis panel, acute -Patient to follow-up next week

## 2017-10-25 ENCOUNTER — Other Ambulatory Visit: Payer: Medicare HMO

## 2017-10-25 DIAGNOSIS — R21 Rash and other nonspecific skin eruption: Secondary | ICD-10-CM | POA: Diagnosis not present

## 2017-10-25 DIAGNOSIS — R7989 Other specified abnormal findings of blood chemistry: Secondary | ICD-10-CM

## 2017-10-25 DIAGNOSIS — R945 Abnormal results of liver function studies: Secondary | ICD-10-CM

## 2017-10-27 ENCOUNTER — Ambulatory Visit: Payer: Medicare HMO | Admitting: Family Medicine

## 2017-10-27 ENCOUNTER — Other Ambulatory Visit: Payer: Self-pay

## 2017-10-27 ENCOUNTER — Encounter: Payer: Self-pay | Admitting: Family Medicine

## 2017-10-27 VITALS — BP 134/58 | HR 88 | Temp 98.1°F | Ht 64.0 in | Wt 151.0 lb

## 2017-10-27 DIAGNOSIS — K1379 Other lesions of oral mucosa: Secondary | ICD-10-CM

## 2017-10-27 DIAGNOSIS — R21 Rash and other nonspecific skin eruption: Secondary | ICD-10-CM

## 2017-10-27 MED ORDER — DIPHENHYDRAMINE HCL 2 % EX CREA: TOPICAL_CREAM | Freq: Three times a day (TID) | CUTANEOUS | 0 refills | Status: DC | PRN

## 2017-10-27 NOTE — Progress Notes (Signed)
Patient came back after visit was complete to ask for a refill of his benadryl cream to use on the itchy areas.  Order pended for provider to sign. Charnice Zwilling,CMA

## 2017-10-27 NOTE — Patient Instructions (Addendum)
   It was great seeing you today!  Please return to be seen if your symptoms worsen or if you develop high fevers.   If you have questions or concerns please do not hesitate to call at [phone].  Dolores Patty, DO PGY-2, [facility] 10/27/2017 4:00 PM    Rash A rash is a change in the color of the skin. A rash can also change the way your skin feels. There are many different conditions and factors that can cause a rash. Follow these instructions at home: Pay attention to any changes in your symptoms. Follow these instructions to help with your condition: Medicine Take or apply over-the-counter and prescription medicines only as told by your doctor. These may include:  Corticosteroid cream.  Anti-itch lotions.  Oral antihistamines.  Skin Care  Put cool compresses on the affected areas.  Try taking a bath with: ? Epsom salts. Follow the instructions on the packaging. You can get these at your local pharmacy or grocery store. ? Baking soda. Pour a small amount into the bath as told by your doctor. ? Colloidal oatmeal. Follow the instructions on the packaging. You can get this at your local pharmacy or grocery store.  Try putting baking soda paste onto your skin. Stir water into baking soda until it gets like a paste.  Do not scratch or rub your skin.  Avoid covering the rash. Make sure the rash is exposed to air as much as possible. General instructions  Avoid hot showers or baths, which can make itching worse. A cold shower may help.  Avoid scented soaps, detergents, and perfumes. Use gentle soaps, detergents, perfumes, and other cosmetic products.  Avoid anything that causes your rash. Keep a journal to help track what causes your rash. Write down: ? What you eat. ? What cosmetic products you use. ? What you drink. ? What you wear. This includes jewelry.  Keep all follow-up visits as told by your doctor. This is important. Contact a doctor  if:  You sweat at night.  You lose weight.  You pee (urinate) more than normal.  You feel weak.  You throw up (vomit).  Your skin or the whites of your eyes look yellow (jaundice).  Your skin: ? Tingles. ? Is numb.  Your rash: ? Does not go away after a few days. ? Gets worse.  You are: ? More thirsty than normal. ? More tired than normal.  You have: ? New symptoms. ? Pain in your belly (abdomen). ? A fever. ? Watery poop (diarrhea). Get help right away if:  Your rash covers all or most of your body. The rash may or may not be painful.  You have blisters that: ? Are on top of the rash. ? Grow larger. ? Grow together. ? Are painful. ? Are inside your nose or mouth.  You have a rash that: ? Looks like purple pinprick-sized spots all over your body. ? Has a "bull's eye" or looks like a target. ? Is red and painful, causes your skin to peel, and is not from being in the sun too long. This information is not intended to replace advice given to you by your health care provider. Make sure you discuss any questions you have with your health care provider. Document Released: 05/16/2008 Document Revised: 05/05/2016 Document Reviewed: 04/15/2015 Elsevier Interactive Patient Education  2018 [employer].

## 2017-10-27 NOTE — Progress Notes (Signed)
    Subjective:    Patient ID: Dale [name], male    DOB: [birth year], 76 y.o.   MRN: [id number]   CC: mouth sore  Has been seen 3x in past 11 days for rash. On steroids (tapering down now). Reports rash on his body has been improving but still itchy, looks a lot better. He feels okay overall. He came in today because he noticed a sore on his lip 2-3 days ago that is persisting and he also has tongue pain. The sore on his lip has stayed the same in size and appearance. His tongue he reports hurts, but there is no visible lesion that he can see. This has made eating difficult secondary to pain. No other sores in his mouth. He was concerned so he came in. He denies fevers or chills, no fatigue. He is eating and drinking okay despite pain with doing so. Normal urine and bowel movements. He has had no new medications, no new exposures he can think of.   Smoking status reviewed-former smoker  Review of Systems- see HPI   Objective:  BP (!) 134/58   Pulse 88   Temp 98.1 F (36.7 C) (Oral)   Ht 5\' 4"  (1.626 m)   Wt 151 lb (68.5 kg)   SpO2 99%   BMI 25.92 kg/m  Vitals and nursing note reviewed  General: well appearing, well nourished, in no acute distress HEENT: normocephalic, no scleral icterus or conjunctival pallor, no nasal discharge, moist mucous membranes, good dentition without erythema or discharge noted in posterior oropharynx. Small ulcer on bottom lip present. No mucosal sloughing or blisters appreciated. Neck: supple, non-tender, without lymphadenopathy Cardiac: RRR, clear S1 and S2, no murmurs, rubs, or gallops Respiratory: clear to auscultation bilaterally, no increased work of breathing Extremities: no edema or cyanosis. Skin: warm and dry, numerous scattered lesions on chest in various stages of healing, some skin peeling noted, no erythema, no discharge. There are some hyperpigmented lesions on palms with rough hyperkeratotic skin on hands Neuro: alert and oriented, no focal  deficits  Assessment & Plan:    Rash and nonspecific skin eruption  Overall rash on body appears to be improving, patient is on steroid taper and feels well. New sore on lip and reports of sore tongue (although nothing visualized on exam) are concerning. The sore appeared 3 days ago and has not progressed, no other lesions seen in mouth. Work up has been largely negative to date. Unclear cause for this diffuse rash.  -check CBC, HIV, RPR today -urgent referral made to derm -continue steroid taper -continue topical benadryl cream -ED precautions given for ANY new oral involvement, patient verbalized understanding   Return in about 1 week (around 11/03/2017), or if symptoms worsen or fail to improve.   Dolores Patty, DO Family Medicine Resident PGY-2

## 2017-10-28 ENCOUNTER — Encounter: Payer: Self-pay | Admitting: Family Medicine

## 2017-10-28 LAB — CBC WITH DIFFERENTIAL/PLATELET
BASOS ABS: 0 10*3/uL (ref 0.0–0.2)
Basos: 0 %
EOS (ABSOLUTE): 0.1 10*3/uL (ref 0.0–0.4)
Eos: 1 %
Hematocrit: 39.8 % (ref 37.5–51.0)
Hemoglobin: 12.2 g/dL — ABNORMAL LOW (ref 13.0–17.7)
IMMATURE GRANS (ABS): 0 10*3/uL (ref 0.0–0.1)
IMMATURE GRANULOCYTES: 0 %
LYMPHS: 17 %
Lymphocytes Absolute: 2 10*3/uL (ref 0.7–3.1)
MCH: 27.1 pg (ref 26.6–33.0)
MCHC: 30.7 g/dL — ABNORMAL LOW (ref 31.5–35.7)
MCV: 88 fL (ref 79–97)
MONOS ABS: 0.6 10*3/uL (ref 0.1–0.9)
Monocytes: 5 %
NEUTROS PCT: 77 %
Neutrophils Absolute: 9.1 10*3/uL — ABNORMAL HIGH (ref 1.4–7.0)
PLATELETS: 330 10*3/uL (ref 150–379)
RBC: 4.5 x10E6/uL (ref 4.14–5.80)
RDW: 12.9 % (ref 12.3–15.4)
WBC: 11.9 10*3/uL — AB (ref 3.4–10.8)

## 2017-10-28 LAB — ACUTE HEP PANEL AND HEP B SURFACE AB

## 2017-10-28 LAB — CMP14+EGFR
A/G RATIO: 1.5 (ref 1.2–2.2)
ALT: 27 IU/L (ref 0–44)
AST: 20 IU/L (ref 0–40)
Albumin: 4 g/dL (ref 3.5–4.8)
Alkaline Phosphatase: 137 IU/L — ABNORMAL HIGH (ref 39–117)
BILIRUBIN TOTAL: 0.5 mg/dL (ref 0.0–1.2)
BUN/Creatinine Ratio: 13 (ref 10–24)
BUN: 16 mg/dL (ref 8–27)
CALCIUM: 9.5 mg/dL (ref 8.6–10.2)
CO2: 20 mmol/L (ref 20–29)
Chloride: 105 mmol/L (ref 96–106)
Creatinine, Ser: 1.28 mg/dL — ABNORMAL HIGH (ref 0.76–1.27)
GFR, EST AFRICAN AMERICAN: 63 mL/min/{1.73_m2} (ref >59)
GFR, EST NON AFRICAN AMERICAN: 55 mL/min/{1.73_m2} — AB (ref >59)
GLOBULIN, TOTAL: 2.7 g/dL (ref 1.5–4.5)
Glucose: 122 mg/dL — ABNORMAL HIGH (ref 65–99)
POTASSIUM: 4.2 mmol/L (ref 3.5–5.2)
SODIUM: 147 mmol/L — AB (ref 134–144)
Total Protein: 6.7 g/dL (ref 6.0–8.5)

## 2017-10-28 LAB — RPR: RPR: NONREACTIVE

## 2017-10-28 LAB — HIV ANTIBODY (ROUTINE TESTING W REFLEX): HIV SCREEN 4TH GENERATION: NONREACTIVE

## 2017-10-28 NOTE — Assessment & Plan Note (Addendum)
  Overall rash on body appears to be improving, patient is on steroid taper and feels well. New sore on lip and reports of sore tongue (although nothing visualized on exam) are concerning. The sore appeared 3 days ago and has not progressed, no other lesions seen in mouth. Work up has been largely negative to date. Unclear cause for this diffuse rash.  -check CBC, HIV, RPR today -urgent referral made to derm -continue steroid taper -continue topical benadryl cream -ED precautions given for ANY new oral involvement, patient verbalized understanding

## 2017-10-30 ENCOUNTER — Telehealth: Payer: Self-pay | Admitting: Internal Medicine

## 2017-10-30 NOTE — Telephone Encounter (Addendum)
Called Mr. Glavin,  talked extensively to his daughter.  Discussed lab results with patient.  Indicated that liver function was improving.  Discussed outcomes of visit with Dr. Wonda Olds, as they had some questions about the plan.  Indicated that patient should have his lab work pulled up on one more time to ensure resolution of elevated liver function and also check on kidney function.

## 2017-11-03 ENCOUNTER — Encounter (HOSPITAL_COMMUNITY): Payer: Self-pay | Admitting: Emergency Medicine

## 2017-11-03 ENCOUNTER — Other Ambulatory Visit: Payer: Self-pay

## 2017-11-03 ENCOUNTER — Ambulatory Visit (HOSPITAL_COMMUNITY)
Admission: EM | Admit: 2017-11-03 | Discharge: 2017-11-03 | Disposition: A | Discharge status: Home / Self Care | Payer: Medicare HMO | Attending: Physician Assistant | Admitting: Physician Assistant

## 2017-11-03 DIAGNOSIS — T783XXA Angioneurotic edema, initial encounter: Secondary | ICD-10-CM

## 2017-11-03 MED ORDER — RANITIDINE HCL 150 MG PO TABS: 150.0000 mg | ORAL_TABLET | Freq: Two times a day (BID) | ORAL | 0 refills | Status: DC

## 2017-11-03 MED ORDER — METHYLPREDNISOLONE SODIUM SUCC 125 MG IJ SOLR
80.0000 mg | Freq: Once | INTRAMUSCULAR | Status: AC
  Administered 2017-11-03: 80 mg via INTRAMUSCULAR

## 2017-11-03 MED ORDER — METHYLPREDNISOLONE SODIUM SUCC 125 MG IJ SOLR
INTRAMUSCULAR | Status: AC
  Filled 2017-11-03: qty 2

## 2017-11-03 NOTE — ED Triage Notes (Signed)
Pt states he feels like something is in his throat, drank a coke which helped him burp. Pt now states this morning he woke up and his lower lip was swollen.

## 2017-11-03 NOTE — Discharge Instructions (Signed)
If any throat or tongue swelling, or difficulty breathing then please call 911.  Apply ICE to the swelling as needed.

## 2017-11-03 NOTE — ED Provider Notes (Signed)
11/03/2017 12:05 PM   DOB: 08-23-1941 / MRN: [id number]  SUBJECTIVE:  Dale [name] is a 76 y.o. male presenting for lower lip swelling that started last night at roughly 4 am and he tells me his is getting better at this time. States that he had a similar but worse reaction about 2 weeks ago and was prescribed prednisone which he took for about 7 days and this ameliorated the problem. He daughter tells me that he can't take benadryl, as this has caused his BP to drop in the past. He does not take an ace or an arb. He denies any throat tightness or tongue swelling. He has good access to his PCP.  Denies a history of diabetes.   He has No Known Allergies.   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    He  reports that he quit smoking about 6 years ago. His smoking use included cigarettes. He has a 20.00 pack-year smoking history. he has never used smokeless tobacco. He reports that he does not drink alcohol or use drugs. He  reports that he does not currently engage in sexual activity. The patient  has a past surgical history that includes Cardiac catheterization (N/A, 12/30/2016); Cardiac catheterization (N/A, 12/30/2016); and Cardiac catheterization (N/A, 12/30/2016).  His family history includes Alzheimer's disease in his father; Cancer in his mother; Heart attack in his father.  Review of Systems  Constitutional: Negative for chills and fever.  Skin: Negative for itching and rash.  Neurological: Negative for dizziness.    OBJECTIVE:  BP (!) 111/59   Pulse 70   Temp 98.8 F (37.1 C)   Resp 14   SpO2 100%   Physical Exam  Constitutional: He is oriented to person, place, and time. He appears well-developed and well-nourished.  HENT:    Mouth/Throat:    Cardiovascular: Normal rate and regular rhythm.  Pulmonary/Chest: Effort normal and breath sounds normal.  Musculoskeletal: Normal range of motion.  Neurological: He is alert and oriented to person, place, and time.   Lab Results  Component Value Date   HGBA1C 5.3 08/07/2014     No results found for this or any previous visit (from the past 72 hour(s)).  No results found.  ASSESSMENT AND PLAN:  The encounter diagnosis was Angioedema, initial encounter. Possibly medication related given he had a similar reaction about 3 weeks ago that was remedied with pred.  He is likely going to need an allergy referral. Started H2 blocker and DepoMedrol.  F/U PCP ASAP to start the referral process.     The patient is advised to call or return to clinic if he does not see an improvement in symptoms, or to seek the care of the closest emergency department if he worsens with the above plan.   Deliah Boston, MHS, PA-C 11/03/2017 12:05 PM    Ofilia Neas, PA-C 11/03/17 1209

## 2017-11-15 ENCOUNTER — Ambulatory Visit: Payer: Medicare HMO | Admitting: Family Medicine

## 2017-11-18 ENCOUNTER — Other Ambulatory Visit: Payer: Self-pay | Admitting: Family Medicine

## 2017-11-18 DIAGNOSIS — R350 Frequency of micturition: Secondary | ICD-10-CM

## 2017-11-18 DIAGNOSIS — N400 Enlarged prostate without lower urinary tract symptoms: Secondary | ICD-10-CM

## 2017-11-21 ENCOUNTER — Other Ambulatory Visit: Payer: Self-pay | Admitting: Family Medicine

## 2017-11-21 DIAGNOSIS — R350 Frequency of micturition: Secondary | ICD-10-CM

## 2017-11-21 DIAGNOSIS — N400 Enlarged prostate without lower urinary tract symptoms: Secondary | ICD-10-CM

## 2017-11-21 MED ORDER — TAMSULOSIN HCL 0.4 MG PO CAPS: ORAL_CAPSULE | ORAL | 2 refills | Status: DC

## 2017-11-22 ENCOUNTER — Ambulatory Visit: Payer: Medicare HMO | Admitting: Family Medicine

## 2017-11-22 ENCOUNTER — Encounter: Payer: Self-pay | Admitting: Family Medicine

## 2017-11-22 ENCOUNTER — Other Ambulatory Visit: Payer: Self-pay

## 2017-11-22 DIAGNOSIS — E7800 Pure hypercholesterolemia, unspecified: Secondary | ICD-10-CM | POA: Diagnosis not present

## 2017-11-22 DIAGNOSIS — R21 Rash and other nonspecific skin eruption: Secondary | ICD-10-CM | POA: Diagnosis not present

## 2017-11-22 DIAGNOSIS — N400 Enlarged prostate without lower urinary tract symptoms: Secondary | ICD-10-CM

## 2017-11-22 NOTE — Patient Instructions (Addendum)
Good to see you today!  Thanks for coming in.  If the swelling or the rash comes back the call me and I will refer you to an allergist  Keep active !  Come back in 6 months

## 2017-11-22 NOTE — Assessment & Plan Note (Signed)
Stable

## 2017-11-22 NOTE — Progress Notes (Signed)
Subjective  Patient is presenting with the following illnesses  RASH ANGIOEDEMA Over the last few months several episodes of urticarial rash and angioedema of lips.  Last was 3-4 weeks ago and has had no new episodes.  Has stopped all allergy medications except his usual loratadine.  No problems swallowing or lip swelling or itching.  All lab work and testing has been normal  HYPERLIPIDEMIA Symptoms Chest pain on exertion:  no   Leg claudication:   no Medications (modifying factor): Compliance- daily pravastatin Right upper quadrant pain- no  Muscle aches- no Duration - years   Timing - continuous    Component Value Date/Time   CHOL 119 12/31/2016 0152   TRIG 41 12/31/2016 0152   HDL 60 12/31/2016 0152   VLDL 8 12/31/2016 0152   CHOLHDL 2.0 12/31/2016 0152    BPH Takes flomax daily.  Urinating ok.  No burning or straining no edema   Chief Complaint noted Review of Symptoms - see HPI PMH - Smoking status noted.     Objective Vital Signs reviewed Heart - Regular rate and rhythm.  No murmurs, gallops or rubs.    Lungs:  Normal respiratory effort, chest expands symmetrically. Lungs are clear to auscultation, no crackles or wheezes. Abdomen: soft and non-tender without masses, organomegaly or hernias noted.  No guarding or rebound Skin:  Intact without suspicious lesions or rashes Mouth - no lesions, mucous membranes are moist, no decaying teeth   Extrem - soft mobile mass mid anterior right shin about 2 cm in diameter - present for many years    Assessments/Plans  BPH (benign prostatic hyperplasia) Stable continue current medications   Rash and nonspecific skin eruption Improved - see after visit summary   HLD (hyperlipidemia) Stable     See after visit summary for details of patient instuctions

## 2017-11-22 NOTE — Assessment & Plan Note (Signed)
Stable continue current medications

## 2017-11-22 NOTE — Assessment & Plan Note (Signed)
Improved see after visit summary  

## 2017-11-24 NOTE — Progress Notes (Signed)
Subjective  Patient is presenting with the following illnesses     Chief Complaint noted Review of Symptoms - see HPI PMH - Smoking status noted.     Objective Vital Signs reviewed     Assessments/Plans  No problem-specific Assessment & Plan notes found for this encounter.   See after visit summary for details of patient instuctions 

## 2017-12-06 ENCOUNTER — Other Ambulatory Visit: Payer: Self-pay | Admitting: Physician Assistant

## 2017-12-16 ENCOUNTER — Other Ambulatory Visit: Payer: Self-pay | Admitting: Neurology

## 2017-12-16 ENCOUNTER — Other Ambulatory Visit: Payer: Self-pay | Admitting: Family Medicine

## 2017-12-16 DIAGNOSIS — R350 Frequency of micturition: Secondary | ICD-10-CM

## 2017-12-16 DIAGNOSIS — N400 Enlarged prostate without lower urinary tract symptoms: Secondary | ICD-10-CM

## 2017-12-18 ENCOUNTER — Other Ambulatory Visit: Payer: Self-pay | Admitting: *Deleted

## 2017-12-18 NOTE — Telephone Encounter (Signed)
Patient's daughter was seen in office today and asked for refills on all patient's meds. Neurology refilled Aricept and Namenda today. Flomax refilled by PCP on 12/16/2017. Verified pharm is [facility] on [address]. Kinnie Feil, RN, BSN

## 2017-12-19 MED ORDER — PRAVASTATIN SODIUM 40 MG PO TABS: ORAL_TABLET | ORAL | 3 refills | Status: DC

## 2017-12-19 MED ORDER — PANTOPRAZOLE SODIUM 40 MG PO TBEC
40.0000 mg | DELAYED_RELEASE_TABLET | Freq: Every day | ORAL | 1 refills | Status: DC
Start: 2017-12-19 — End: 2019-01-02

## 2017-12-19 MED ORDER — TICAGRELOR 90 MG PO TABS: 90.0000 mg | ORAL_TABLET | Freq: Two times a day (BID) | ORAL | 1 refills | Status: DC

## 2017-12-30 ENCOUNTER — Other Ambulatory Visit: Payer: Self-pay | Admitting: Cardiology

## 2018-01-03 ENCOUNTER — Other Ambulatory Visit: Payer: Self-pay

## 2018-01-03 MED ORDER — TICAGRELOR 90 MG PO TABS: 90.0000 mg | ORAL_TABLET | Freq: Two times a day (BID) | ORAL | 0 refills | Status: DC

## 2018-01-18 ENCOUNTER — Encounter: Payer: Self-pay | Admitting: Nurse Practitioner

## 2018-03-03 ENCOUNTER — Other Ambulatory Visit: Payer: Self-pay | Admitting: Family Medicine

## 2018-03-07 ENCOUNTER — Ambulatory Visit: Payer: Medicare HMO | Admitting: Nurse Practitioner

## 2018-03-26 NOTE — Progress Notes (Signed)
[facility]  PATIENT: Dale [name] DOB: June 08, 1941   REASON FOR VISIT: Follow-up for memory loss HISTORY FROM: Patient and friend    HISTORY OF PRESENT ILLNESS:Dale [name] is a 59 right-handed male, accompanied by his daughter [name], seen in refer by his primary care doctor Carney Living for evaluation of memory loss, initial evaluation was on December 12th 2017.  I reviewed and summarized the referring note, he had a history of hyperlipidemia, he graduate from 6th grade, he lives by himself since 2007, he still drives, does lawn care. His father had Alzheimer's disease at age 85s.   He was noted to have memory loss since 2015, loosing his keys, forget to turn his car key off, he made mistake paying bills, he denies gait abnormality, has good appetite, sleeps well.   I reviewed laboratory evaluations, normal folic acid B12, CBC, TSH, CMP  UPDATE March 06 2017:YY He is with his daughter at visit, he still lives by himself, able to does lawn care, he feels anxious sometime, difficulty with multitasking, his Mini-Mental Status Examination has decreased from previous 72 in December 2017 2 current 25 L he was admitted to hospital in January 2018 for now non-SETMI, echocardiogram showed ejection fraction 55-60%, wall motion was normal. Angiogram showed circumferential 100% stenosis, he had a stent placement. He is now on aspirin 81 mg and Brilinta 90 mg twice a day I reviewed laboratory evaluation in August 2017, vitamin B12 544, folic acid 14 point 7, TSH 1.61 I have personally reviewed MRI of the brain in January 2018, mild generalized atrophy, supratentorium small vessel disease. UPDATE 09/26/2018CM Dale [name], 77 year old male returns for follow-up with history of memory loss. He continues to live alone, does his own cooking with family members checking in on him. He continues to drive and has not lost. He continues to mow his grass do his chores around  the house. He walks for exercise. He has not been doing any memory stimulating exercises. His father had Alzheimer's disease He returns for reevaluation UPDATE 4/16/2019CM Dale [name], 77 year old male returns for follow-up with a history of memory loss.  He continues to live alone independent in activities of daily living does his own cooking.  Family checks on him.  He continues to drive has not gotten lost.  He continues to mow his grass , walks for exercise.  He remains on Namenda and Aricept without side effects.MRI of the brain in January 2018, mild generalized atrophy, supratentorium small vessel disease.  He returns for reevaluation   REVIEW OF SYSTEMS: Full 14 system review of systems performed and notable only for those listed, all others are neg:  Constitutional: neg  Cardiovascular: neg Ear/Nose/Throat: Allergies Skin: neg Eyes: neg Respiratory: neg Gastroitestinal: Urinary frequency Hematology/Lymphatic: neg  Endocrine: neg Musculoskeletal:neg Allergy/Immunology: neg Neurological: Memory loss Psychiatric: neg Sleep : neg   ALLERGIES: No Known Allergies  HOME MEDICATIONS: Outpatient Medications Prior to Visit  Medication Sig Dispense Refill  . aspirin 81 MG chewable tablet Chew 1 tablet (81 mg total) by mouth daily. 30 tablet 3  . donepezil (ARICEPT) 10 MG tablet TAKE 1 TABLET(10 MG) BY MOUTH AT BEDTIME 90 tablet 1  . loratadine (CLARITIN) 10 MG tablet Take 10 mg by mouth daily.    . memantine (NAMENDA) 10 MG tablet Take 1 tablet (10 mg total) by mouth 2 (two) times daily. 180 tablet 4  . nitroGLYCERIN (NITROSTAT) 0.4 MG SL tablet Place 1 tablet (0.4 mg total) under the tongue  every 5 (five) minutes x 3 doses as needed for chest pain. 25 tablet 12  . pantoprazole (PROTONIX) 40 MG tablet Take 1 tablet (40 mg total) by mouth daily. 90 tablet 1  . pravastatin (PRAVACHOL) 40 MG tablet TAKE 1 TABLET(40 MG) BY MOUTH DAILY 90 tablet 0  . tamsulosin (FLOMAX) 0.4 MG CAPS capsule  TAKE 1 CAPSULE(0.4 MG) BY MOUTH DAILY AFTER SUPPER 90 capsule 2  . ticagrelor (BRILINTA) 90 MG TABS tablet Take 1 tablet (90 mg total) by mouth 2 (two) times daily. 180 tablet 0  . donepezil (ARICEPT) 10 MG tablet Take 1 tablet (10 mg total) by mouth at bedtime. 90 tablet 4  . memantine (NAMENDA) 10 MG tablet TAKE 1 TABLET(10 MG) BY MOUTH TWICE DAILY 180 tablet 1   No facility-administered medications prior to visit.     PAST MEDICAL HISTORY: Past Medical History:  Diagnosis Date  . Coronary artery disease   . Dementia   . GERD (gastroesophageal reflux disease)   . Hyperlipemia   . Leg mass    rt  . Memory loss   . Myocardial infarction less than 4 weeks ago (HCC) 12/29/2016  . Prostate disease   . Quit smoking 11/20/2008   Qualifier: Diagnosis of  By: Deirdre Priest MD, Gaynell Face    . S/P angioplasty with stent to LCX     PAST SURGICAL HISTORY: Past Surgical History:  Procedure Laterality Date  . CARDIAC CATHETERIZATION N/A 12/30/2016   Procedure: Coronary Stent Intervention;  Surgeon: Runell Gess, MD;  Location: [facility] INVASIVE CV LAB;  Service: Cardiovascular;  Laterality: N/A;  . CARDIAC CATHETERIZATION N/A 12/30/2016   Procedure: Coronary Angiography;  Surgeon: Runell Gess, MD;  Location: [facility] INVASIVE CV LAB;  Service: Cardiovascular;  Laterality: N/A;  . CARDIAC CATHETERIZATION N/A 12/30/2016   Procedure: Coronary Balloon Angioplasty;  Surgeon: Runell Gess, MD;  Location: [facility] INVASIVE CV LAB;  Service: Cardiovascular;  Laterality: N/A;    FAMILY HISTORY: Family History  Problem Relation Age of Onset  . Cancer Mother        Posey Rea of type  . Heart attack Father   . Alzheimer's disease Father     SOCIAL HISTORY: Social History   Socioeconomic History  . Marital status: Widowed    Spouse name: Not on file  . Number of children: 2  . Years of education: 6th  . Highest education level: Not on file  Occupational History  . Occupation: Retired  Engineer, production  .  Financial resource strain: Not on file  . Food insecurity:    Worry: Not on file    Inability: Not on file  . Transportation needs:    Medical: Not on file    Non-medical: Not on file  Tobacco Use  . Smoking status: Former Smoker    Packs/day: 1.00    Years: 20.00    Pack years: 20.00    Types: Cigarettes    Last attempt to quit: 10/23/2011    Years since quitting: 6.4  . Smokeless tobacco: Never Used  Substance and Sexual Activity  . Alcohol use: No    Comment: occ  . Drug use: No  . Sexual activity: Not Currently    Comment: Patient would like to be. Discussed wearing condom if becomes sexually active  Lifestyle  . Physical activity:    Days per week: Not on file    Minutes per session: Not on file  . Stress: Not on file  Relationships  . Social connections:  Talks on phone: Not on file    Gets together: Not on file    Attends religious service: Not on file    Active member of club or organization: Not on file    Attends meetings of clubs or organizations: Not on file    Relationship status: Not on file  . Intimate partner violence:    Fear of current or ex partner: Not on file    Emotionally abused: Not on file    Physically abused: Not on file    Forced sexual activity: Not on file  Other Topics Concern  . Not on file  Social History Narrative   Past Medical History:      EST approximately 2002         Family History:      Sister had CABG in her 78s       Social History:      He is retired frmm working for city.  Does yard work with tractor and rides motorcycles. Has 2 children.  Sees his grandkids regularly.        Right-handed.   Occasional caffeine use.      Current Social History 08/30/2017        Who lives at home: Patient lives alone in one level home 08/30/2017    Transportation: Patient has own vehicle 08/30/2017   Important Relationships Children, friend Technical brewer Cancer) and daughter-in-law Eather Colas) 08/30/2017    Pets: None 08/30/2017    Education / Work:  6 th grade/ Mows lawns 08/30/2017   Interests / Fun: Likes to go for drives 08/30/2017   Current Stressors: Kids telling him what to do 08/30/2017   Religious / Personal Beliefs: AME 08/30/2017   L. Ducatte, RN, BSN                                                                                                   PHYSICAL EXAM  Vitals:   03/27/18 1356  BP: 104/62  Pulse: (!) 55  Weight: 143 lb 12.8 oz (65.2 kg)  Height: 5\' 4"  (1.626 m)   Body mass index is 24.68 kg/m.  Generalized: Well developed, in no acute distress , well-groomed Head: normocephalic and atraumatic,. Oropharynx benign  Neck: Supple,  Cardiac: Regular rate rhythm, no murmur  Musculoskeletal: No deformity   Neurological examination   Mentation: Alert  MMSE - Mini Mental State Exam 03/27/2018 09/06/2017 03/06/2017  Orientation to time 4 3 4   Orientation to Place 3 4 4   Registration 3 3 3   Attention/ Calculation 0 1 4  Recall 1 1 2   Language- name 2 objects 2 2 2   Language- repeat 1 0 1  Language- follow 3 step command 3 3 3   Language- read & follow direction 1 1 1   Write a sentence 1 1 1   Copy design 0 0 0  Total score 19 19 25   AFT 8. Clock drawing 4/4  Follows all commands speech and language fluent.   Cranial nerve II-XII: Pupils were equal round reactive to light extraocular movements were full, visual field were full on confrontational test. Facial  sensation and strength were normal. hearing was intact to finger rubbing bilaterally. Uvula tongue midline. head turning and shoulder shrug were normal and symmetric.Tongue protrusion into cheek strength was normal. Motor: normal bulk and tone, full strength in the BUE, BLE, fine finger movements normal, no pronator drift. No focal weakness Sensory: normal and symmetric to light touch, in the upper and lower extremities Coordination: finger-nose-finger, heel-to-shin bilaterally, no dysmetria Reflexes: Symmetric upper and lower, plantar  responses were flexor bilaterally. Gait and Station: Rising up from seated position without assistance, normal stance,  moderate stride, good arm swing, smooth turning, able to perform tiptoe, and heel walking without difficulty. Tandem gait is steady. No assistive device  DIAGNOSTIC DATA (LABS, IMAGING, TESTING) - I reviewed patient records, labs, notes, testing and imaging myself where available.  Lab Results  Component Value Date   WBC 11.9 (H) 10/27/2017   HGB 12.2 (L) 10/27/2017   HCT 39.8 10/27/2017   MCV 88 10/27/2017   PLT 330 10/27/2017      Component Value Date/Time   NA 147 (H) 10/25/2017 0956   K 4.2 10/25/2017 0956   CL 105 10/25/2017 0956   CO2 20 10/25/2017 0956   GLUCOSE 122 (H) 10/25/2017 0956   GLUCOSE 110 (H) 09/04/2017 0841   BUN 16 10/25/2017 0956   CREATININE 1.28 (H) 10/25/2017 0956   CREATININE 1.10 01/09/2017 1611   CALCIUM 9.5 10/25/2017 0956   PROT 6.7 10/25/2017 0956   ALBUMIN 4.0 10/25/2017 0956   AST 20 10/25/2017 0956   ALT 27 10/25/2017 0956   ALKPHOS 137 (H) 10/25/2017 0956   BILITOT 0.5 10/25/2017 0956   GFRNONAA 55 (L) 10/25/2017 0956   GFRAA 63 10/25/2017 0956   Lab Results  Component Value Date   CHOL 119 12/31/2016   HDL 60 12/31/2016   LDLCALC 51 12/31/2016   TRIG 41 12/31/2016   CHOLHDL 2.0 12/31/2016     ASSESSMENT AND PLAN Dale [name] is a 77 y.o. male  With diagnosis of mild cognitive impairment with progressive worsening. Mini-Mental Status exam today 19/30. Previous 19/30. There is a family history of Alzheimer's dementia MRI of the brain, showed generalized atrophy Patient is not interested in research study.  PLAN: Continue Aricept at current dose will refill Continue Namenda at current dose will refill  Perform cognitively stimulating exercises to stimulate memory Follow-up in 6 months Walk daily for exercise this will also help your memory I spent 25 minutes in total face to face time with the patient/friend   more than 50% of which was spent counseling and coordination of care, reviewing test results reviewing medications and discussing and reviewing the diagnosis of progressive Alzheimer's dementia, memory stimulating exercises, importance of safety measures.  Be careful with driving Nilda Riggs, [facility], [facility], APRN  [facility] [address], [address] [place], [place] [address] [phone]

## 2018-03-27 ENCOUNTER — Ambulatory Visit: Payer: Medicare HMO | Admitting: Nurse Practitioner

## 2018-03-27 ENCOUNTER — Encounter: Payer: Self-pay | Admitting: Nurse Practitioner

## 2018-03-27 DIAGNOSIS — R413 Other amnesia: Secondary | ICD-10-CM | POA: Diagnosis not present

## 2018-03-27 DIAGNOSIS — F0390 Unspecified dementia without behavioral disturbance: Secondary | ICD-10-CM | POA: Insufficient documentation

## 2018-03-27 MED ORDER — MEMANTINE HCL 10 MG PO TABS: 10.0000 mg | ORAL_TABLET | Freq: Two times a day (BID) | ORAL | 3 refills | Status: DC

## 2018-03-27 MED ORDER — DONEPEZIL HCL 10 MG PO TABS: ORAL_TABLET | ORAL | 3 refills | Status: DC

## 2018-03-27 NOTE — Patient Instructions (Signed)
Continue Aricept at current dose Continue Namenda at current dose Perform cognitively stimulating exercises to stimulate memory Follow-up in 6 months Walk daily for exercise

## 2018-03-28 NOTE — Progress Notes (Signed)
I have reviewed and agreed above plan. 

## 2018-04-08 ENCOUNTER — Other Ambulatory Visit: Payer: Self-pay | Admitting: Family Medicine

## 2018-05-15 ENCOUNTER — Ambulatory Visit (INDEPENDENT_AMBULATORY_CARE_PROVIDER_SITE_OTHER): Payer: Medicare HMO | Admitting: Family Medicine

## 2018-05-15 ENCOUNTER — Telehealth: Payer: Self-pay | Admitting: Family Medicine

## 2018-05-15 ENCOUNTER — Encounter: Payer: Self-pay | Admitting: Family Medicine

## 2018-05-15 DIAGNOSIS — R0789 Other chest pain: Secondary | ICD-10-CM

## 2018-05-15 NOTE — Patient Instructions (Addendum)
Good to see you today!  Thanks for coming in.  For Your Daughter - check to make sure he has all the medications on this list - Bring all the medications in their bottles on the trip   Call me Dr Deirdre Priest if you have any questions before you all go  Have a great time

## 2018-05-15 NOTE — Progress Notes (Deleted)
Date of Visit: 05/15/2018   HPI:  ***  ROS: See HPI.  PMFSH: ***  PHYSICAL EXAM: There were no vitals taken for this visit. Gen: *** HEENT: *** Heart: *** Lungs: *** Neuro: *** Ext: ***  ASSESSMENT/PLAN:  Health maintenance:  -***  No problem-specific Assessment & Plan notes found for this encounter.  FOLLOW UP: Follow up in *** for ***  [place] J. Pollie Meyer, MD [facility]

## 2018-05-15 NOTE — Assessment & Plan Note (Signed)
Transient episode of poorly described chest discomfort.  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

## 2018-05-15 NOTE — Progress Notes (Signed)
Subjective  Dale [name] is a 77 y.o. male is presenting with the following  CHEST PAIN  Chest pain began several days ago one episode of chest jerking twinging that lasted a short time.   Hard to describe and hard to remember exactly.  He has been working with his tractor and doing chores and has not had any chest pain or shortness of breath   PMH - had NSTEMI with stent of one affected vessel   Symptoms Nausea/vomiting: no Shortness of breath: no Pleuritic pain: no Cough: no Swelling of legs: no Syncope: no Heart burn or food sticking: no Immobility: no  Review of Symptoms - see HPI PMH - Smoking status noted.     Chief Complaint noted Review of Symptoms - see HPI PMH - Smoking status noted.    Objective Vital Signs reviewed BP 100/60 (BP Location: Left Arm, Patient Position: Sitting, Cuff Size: Normal)   Pulse (!) 57   Temp 98.2 F (36.8 C) (Oral)   Ht 5\' 4"  (1.626 m)   Wt 143 lb 6.4 oz (65 kg)   SpO2 100%   BMI 24.61 kg/m    Alert interactive Can not remember any medications names Does remember he is leaving on a cruise in a few days Heart - slow rate and normal  rhythm.  No murmurs, gallops or rubs.    Lungs:  Normal respiratory effort, chest expands symmetrically. Lungs are clear to auscultation, no crackles or wheezes. Extremities:  No cyanosis, edema, or deformity noted with good range of motion of all major joints.   Able to get up and down from exam table without assistance   Assessments/Plans  See after visit summary for details of patient instuctions  Chest discomfort Transient episode of poorly described chest discomfort.  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

## 2018-05-24 NOTE — Telephone Encounter (Signed)
error 

## 2018-06-19 ENCOUNTER — Other Ambulatory Visit: Payer: Self-pay | Admitting: Neurology

## 2018-07-24 IMAGING — CR DG ORBITS FOR FOREIGN BODY
2 series · 2 of 2 positions shown · non-contrast
Comparison: CT brain scan of 08/03/2016

CLINICAL DATA: Screening for MRI, remote history of working with
metal

EXAM:
ORBITS FOR FOREIGN BODY - 2 VIEW

[w orbit pa (1 of 2)]
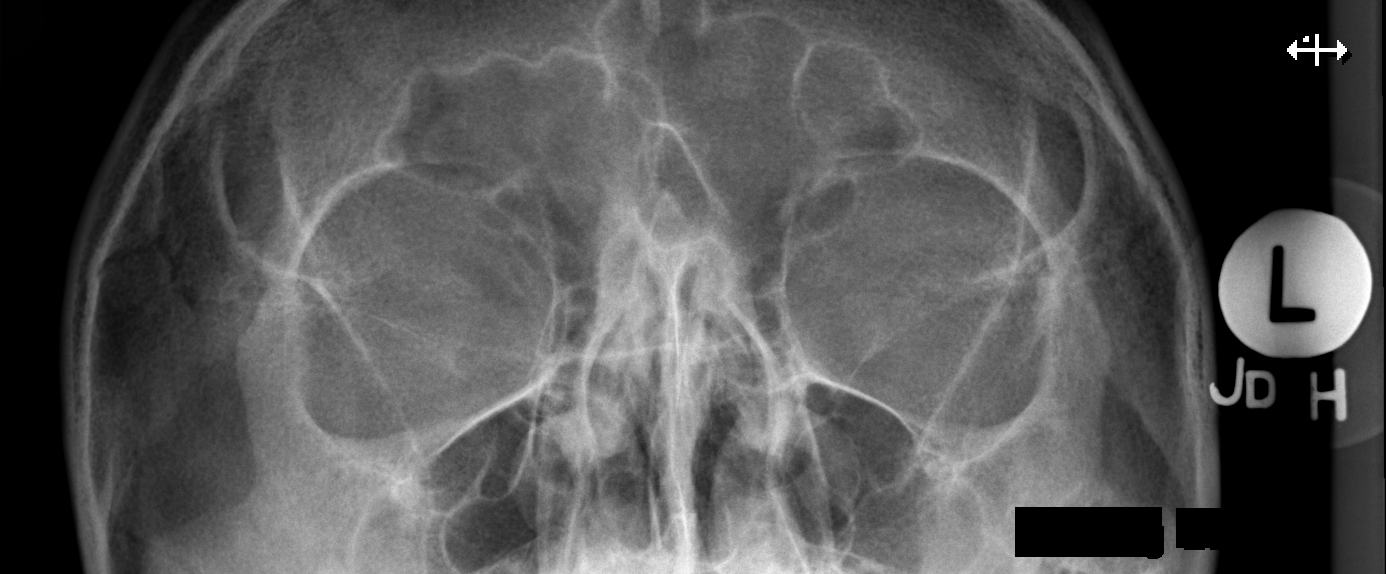

[w orbit pa (2 of 2)]
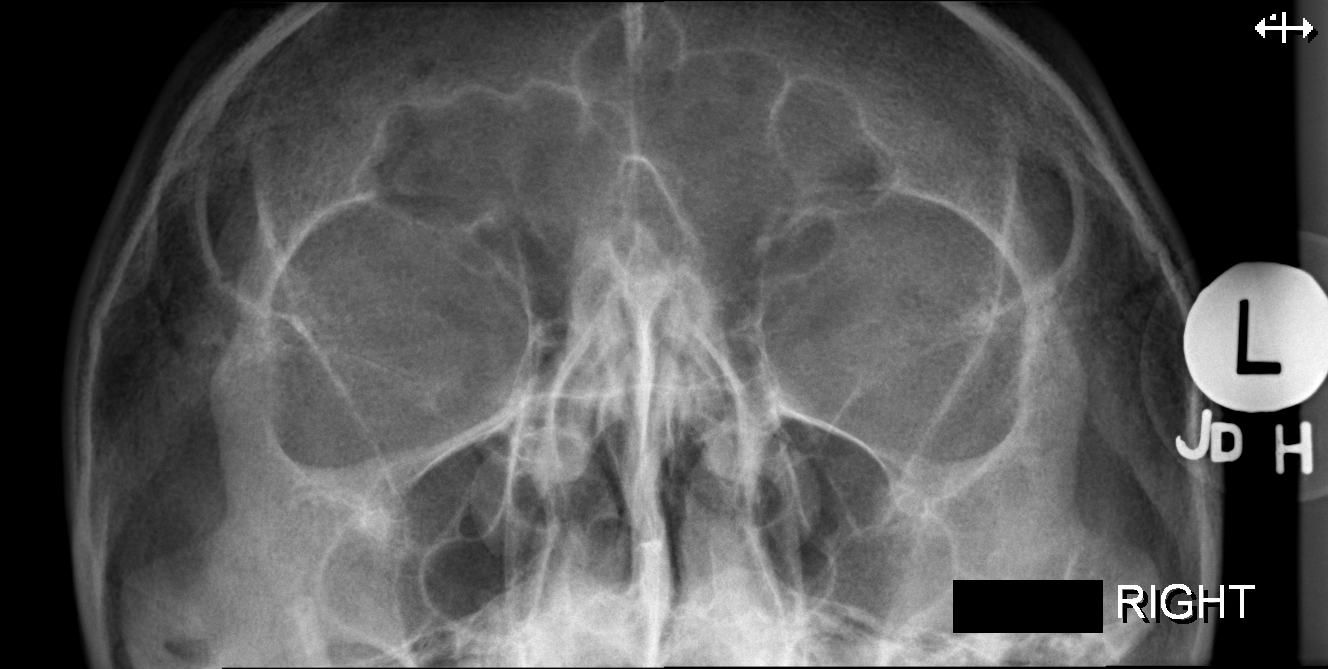

[2 of 2 positions shown; findings below may reference images not displayed]

FINDINGS: Views of the orbits were obtained with the patient looking to the
left and looking to the right. No orbital metallic foreign body is
seen. No bony abnormality is noted. The sinuses that are visualized
appear clear.
IMPRESSION: No evidence of metallic foreign body within the orbits.

## 2018-09-10 ENCOUNTER — Ambulatory Visit (HOSPITAL_COMMUNITY)
Admission: RE | Admit: 2018-09-10 | Discharge: 2018-09-10 | Disposition: A | Discharge status: Home / Self Care | Payer: Medicare HMO | Source: Ambulatory Visit | Attending: Family Medicine | Admitting: Family Medicine

## 2018-09-10 ENCOUNTER — Other Ambulatory Visit: Payer: Self-pay

## 2018-09-10 ENCOUNTER — Encounter (HOSPITAL_COMMUNITY): Payer: Self-pay | Admitting: Emergency Medicine

## 2018-09-10 ENCOUNTER — Ambulatory Visit (INDEPENDENT_AMBULATORY_CARE_PROVIDER_SITE_OTHER): Payer: Medicare HMO | Admitting: Student in an Organized Health Care Education/Training Program

## 2018-09-10 ENCOUNTER — Emergency Department (HOSPITAL_COMMUNITY): Payer: Medicare HMO

## 2018-09-10 ENCOUNTER — Observation Stay (HOSPITAL_COMMUNITY)
Admission: EM | Admit: 2018-09-10 | Discharge: 2018-09-11 | Disposition: A | Discharge status: Home / Self Care | Payer: Medicare HMO | Attending: Family Medicine | Admitting: Family Medicine

## 2018-09-10 DIAGNOSIS — E785 Hyperlipidemia, unspecified: Secondary | ICD-10-CM | POA: Diagnosis not present

## 2018-09-10 DIAGNOSIS — Z809 Family history of malignant neoplasm, unspecified: Secondary | ICD-10-CM | POA: Insufficient documentation

## 2018-09-10 DIAGNOSIS — K219 Gastro-esophageal reflux disease without esophagitis: Secondary | ICD-10-CM | POA: Insufficient documentation

## 2018-09-10 DIAGNOSIS — F419 Anxiety disorder, unspecified: Secondary | ICD-10-CM | POA: Diagnosis not present

## 2018-09-10 DIAGNOSIS — R0789 Other chest pain: Secondary | ICD-10-CM | POA: Diagnosis not present

## 2018-09-10 DIAGNOSIS — I10 Essential (primary) hypertension: Secondary | ICD-10-CM | POA: Diagnosis not present

## 2018-09-10 DIAGNOSIS — N400 Enlarged prostate without lower urinary tract symptoms: Secondary | ICD-10-CM | POA: Diagnosis not present

## 2018-09-10 DIAGNOSIS — I252 Old myocardial infarction: Secondary | ICD-10-CM | POA: Insufficient documentation

## 2018-09-10 DIAGNOSIS — R4189 Other symptoms and signs involving cognitive functions and awareness: Secondary | ICD-10-CM

## 2018-09-10 DIAGNOSIS — I25119 Atherosclerotic heart disease of native coronary artery with unspecified angina pectoris: Secondary | ICD-10-CM

## 2018-09-10 DIAGNOSIS — Z79899 Other long term (current) drug therapy: Secondary | ICD-10-CM | POA: Diagnosis not present

## 2018-09-10 DIAGNOSIS — Z955 Presence of coronary angioplasty implant and graft: Secondary | ICD-10-CM | POA: Insufficient documentation

## 2018-09-10 DIAGNOSIS — R001 Bradycardia, unspecified: Secondary | ICD-10-CM

## 2018-09-10 DIAGNOSIS — Z820 Family history of epilepsy and other diseases of the nervous system: Secondary | ICD-10-CM | POA: Insufficient documentation

## 2018-09-10 DIAGNOSIS — R072 Precordial pain: Secondary | ICD-10-CM | POA: Diagnosis not present

## 2018-09-10 DIAGNOSIS — Z87891 Personal history of nicotine dependence: Secondary | ICD-10-CM | POA: Insufficient documentation

## 2018-09-10 DIAGNOSIS — I2510 Atherosclerotic heart disease of native coronary artery without angina pectoris: Secondary | ICD-10-CM | POA: Insufficient documentation

## 2018-09-10 DIAGNOSIS — F0390 Unspecified dementia without behavioral disturbance: Secondary | ICD-10-CM | POA: Diagnosis not present

## 2018-09-10 DIAGNOSIS — Z7982 Long term (current) use of aspirin: Secondary | ICD-10-CM | POA: Insufficient documentation

## 2018-09-10 DIAGNOSIS — I440 Atrioventricular block, first degree: Secondary | ICD-10-CM

## 2018-09-10 DIAGNOSIS — Z23 Encounter for immunization: Secondary | ICD-10-CM

## 2018-09-10 DIAGNOSIS — Z9109 Other allergy status, other than to drugs and biological substances: Secondary | ICD-10-CM | POA: Diagnosis not present

## 2018-09-10 DIAGNOSIS — R079 Chest pain, unspecified: Secondary | ICD-10-CM | POA: Diagnosis not present

## 2018-09-10 DIAGNOSIS — Z8249 Family history of ischemic heart disease and other diseases of the circulatory system: Secondary | ICD-10-CM | POA: Insufficient documentation

## 2018-09-10 DIAGNOSIS — Z9861 Coronary angioplasty status: Secondary | ICD-10-CM

## 2018-09-10 LAB — CBC
HCT: 39.9 % (ref 39.0–52.0)
HEMATOCRIT: 44.8 % (ref 39.0–52.0)
HEMOGLOBIN: 11.8 g/dL — AB (ref 13.0–17.0)
Hemoglobin: 13.1 g/dL (ref 13.0–17.0)
MCH: 26.6 pg (ref 26.0–34.0)
MCH: 26.7 pg (ref 26.0–34.0)
MCHC: 29.2 g/dL — AB (ref 30.0–36.0)
MCHC: 29.6 g/dL — AB (ref 30.0–36.0)
MCV: 90.9 fL (ref 78.0–100.0)
MCV: 90.3 fL (ref 78.0–100.0)
PLATELETS: 220 10*3/uL (ref 150–400)
PLATELETS: 201 10*3/uL (ref 150–400)
RBC: 4.93 MIL/uL (ref 4.22–5.81)
RBC: 4.42 MIL/uL (ref 4.22–5.81)
RDW: 12.4 % (ref 11.5–15.5)
RDW: 12.5 % (ref 11.5–15.5)
WBC: 7.5 10*3/uL (ref 4.0–10.5)
WBC: 6.1 10*3/uL (ref 4.0–10.5)

## 2018-09-10 LAB — TROPONIN I: Troponin I: <0.03 ng/mL (ref <0.03)

## 2018-09-10 LAB — CREATININE, SERUM: CREATININE: 1.09 mg/dL (ref 0.61–1.24)

## 2018-09-10 LAB — BASIC METABOLIC PANEL
Anion gap: 12 (ref 5–15)
BUN: 13 mg/dL (ref 8–23)
CHLORIDE: 100 mmol/L (ref 98–111)
CO2: 28 mmol/L (ref 22–32)
CREATININE: 1.11 mg/dL (ref 0.61–1.24)
Calcium: 9.2 mg/dL (ref 8.9–10.3)
GFR calc Af Amer: >60 mL/min (ref >60)
GFR calc non Af Amer: >60 mL/min (ref >60)
GLUCOSE: 95 mg/dL (ref 70–99)
POTASSIUM: 4 mmol/L (ref 3.5–5.1)
SODIUM: 140 mmol/L (ref 135–145)

## 2018-09-10 LAB — I-STAT TROPONIN, ED: Troponin i, poc: 0 ng/mL (ref 0.00–0.08)

## 2018-09-10 MED ORDER — LORATADINE 10 MG PO TABS
10.0000 mg | ORAL_TABLET | Freq: Every day | ORAL | Status: DC
  Administered 2018-09-10 – 2018-09-11 (×2): 10 mg via ORAL
  Filled 2018-09-10 (×2): qty 1

## 2018-09-10 MED ORDER — ONDANSETRON HCL 4 MG/2ML IJ SOLN: 4.0000 mg | Freq: Four times a day (QID) | INTRAMUSCULAR | Status: DC | PRN

## 2018-09-10 MED ORDER — GI COCKTAIL ~~LOC~~: 30.0000 mL | Freq: Four times a day (QID) | ORAL | Status: DC | PRN

## 2018-09-10 MED ORDER — ACETAMINOPHEN 325 MG PO TABS: 650.0000 mg | ORAL_TABLET | ORAL | Status: DC | PRN

## 2018-09-10 MED ORDER — TICAGRELOR 90 MG PO TABS
90.0000 mg | ORAL_TABLET | Freq: Two times a day (BID) | ORAL | Status: DC
  Administered 2018-09-10 – 2018-09-11 (×2): 90 mg via ORAL
  Filled 2018-09-10 (×2): qty 1

## 2018-09-10 MED ORDER — ISOSORBIDE MONONITRATE ER 30 MG PO TB24
15.0000 mg | ORAL_TABLET | Freq: Every day | ORAL | Status: DC
  Administered 2018-09-10 – 2018-09-11 (×2): 15 mg via ORAL
  Filled 2018-09-10 (×2): qty 1

## 2018-09-10 MED ORDER — NITROGLYCERIN 0.4 MG SL SUBL: 0.4000 mg | SUBLINGUAL_TABLET | SUBLINGUAL | Status: DC | PRN

## 2018-09-10 MED ORDER — ASPIRIN 81 MG PO CHEW
81.0000 mg | CHEWABLE_TABLET | Freq: Every day | ORAL | Status: DC
  Administered 2018-09-10 – 2018-09-11 (×2): 81 mg via ORAL
  Filled 2018-09-10 (×2): qty 1

## 2018-09-10 MED ORDER — ASPIRIN 325 MG PO TABS
325.0000 mg | ORAL_TABLET | Freq: Once | ORAL | Status: AC
  Administered 2018-09-10: 325 mg via ORAL

## 2018-09-10 MED ORDER — DONEPEZIL HCL 5 MG PO TABS: 10.0000 mg | ORAL_TABLET | Freq: Every day | ORAL | Status: DC

## 2018-09-10 MED ORDER — MEMANTINE HCL 10 MG PO TABS
10.0000 mg | ORAL_TABLET | Freq: Two times a day (BID) | ORAL | Status: DC
  Administered 2018-09-10 – 2018-09-11 (×2): 10 mg via ORAL
  Filled 2018-09-10 (×2): qty 1

## 2018-09-10 MED ORDER — PRAVASTATIN SODIUM 40 MG PO TABS
40.0000 mg | ORAL_TABLET | Freq: Every day | ORAL | Status: DC
  Administered 2018-09-10 – 2018-09-11 (×2): 40 mg via ORAL
  Filled 2018-09-10 (×2): qty 1

## 2018-09-10 MED ORDER — PANTOPRAZOLE SODIUM 40 MG PO TBEC
40.0000 mg | DELAYED_RELEASE_TABLET | Freq: Every day | ORAL | Status: DC
  Administered 2018-09-11: 40 mg via ORAL
  Filled 2018-09-10: qty 1

## 2018-09-10 MED ORDER — ENOXAPARIN SODIUM 40 MG/0.4ML ~~LOC~~ SOLN
40.0000 mg | SUBCUTANEOUS | Status: DC
  Administered 2018-09-10: 40 mg via SUBCUTANEOUS
  Filled 2018-09-10: qty 0.4

## 2018-09-10 MED ORDER — TAMSULOSIN HCL 0.4 MG PO CAPS
0.4000 mg | ORAL_CAPSULE | Freq: Every day | ORAL | Status: DC
  Administered 2018-09-10: 0.4 mg via ORAL
  Filled 2018-09-10: qty 1

## 2018-09-10 NOTE — ED Triage Notes (Signed)
Pt to ER for evaluation of central chest pressure onset "a while ago." reports shortness of breath. Reports hx of same "when I had a heart attack." pt reports resting makes it better. Was sent here from family medicine.

## 2018-09-10 NOTE — Progress Notes (Addendum)
Family Medicine Teaching Service Daily Progress Note Intern Pager: [phone]  [name] Medical record number: [id number] Date of birth: 05/08/1941 Age: 77 y.o. Gender: male  Primary Care Provider: Carney Living, MD Consultants: Cards Code Status: Full  Pt Overview and Major Events to Date:  9/30 Admitted to FPTS  Assessment and Plan: Dale [name] is a 77 y.o. male presenting with abdominal pain while talking to people. PMH is significant for previous NSTEMI with PCI Jan 2018, GERD, HTN, Dementia, and BPH3  Chest Pain: Resolved S/P stent 12/2016.  Trops negative x3. EKG this AM stable from yesterday, 1st degree AVB. Overnight chest pain resolved.  He states "When I don't think about my family, I don't have chest pain." Cardiology notes can d/c after myovue if normal. - lexiscan myovue today - cards consulted, appreciate recs - cont imdur - cont home ASA, brilinta - cont cardiac monitoring, continuous pulse ox - will touch base with cards regarding continuation of DAPT, as stent placed 12/2016  Bradycardia: Stable Chart review shows bradycardia prior to starting aricept. Last TSH 0.86 in 2017. HR 40s on admit.  Pulse this AM recorded as 27 at 0301, suspect this is incorrect as ECG heart rate 56.  On exam, noted to be in 87s.  - holding aricept - cardiac monitoring - check TSH today, is 1.785  GERD: Chronic On protonix QD, unsure of compliance. - cont protonix 40mg  QD  HLD: Chronic Lipid Panel total 153, LDL 70, HDL 65. On Pravastatin 40mg  QD. - cont pravastatin  BPH: Chronic - cont home tamsulosin   Allergies: Chronic - cont home loratadine 10mg  QD  FEN/GI: NPO as of 0000 PPx: lovenow  Disposition: pending clinical improvement  Subjective:  Denies complaints this AM.  He states "When I don't think about my family, I don't have chest pain." No shortness of breath.  Objective: Temp:  [97.6 F (36.4 C)-98.9 F (37.2 C)] 98.9 F (37.2 C)  (10/01 0321) Pulse Rate:  [27-66] 27 (10/01 0321) Resp:  [10-62] 22 (10/01 0321) BP: (100-172)/(52-92) 103/64 (10/01 0321) SpO2:  [96 %-100 %] 99 % (10/01 0321) Weight:  [64.1 kg-66.2 kg] 64.1 kg (10/01 0346)  Physical Exam:  General: 77 y.o. male in NAD Cardio: Bradycardic to 4s, no m/r/g Lungs: CTAB, no wheezing, no rhonchi, no crackles Abdomen: Soft, non-tender to palpation, positive bowel sounds Skin: warm and dry Extremities: No edema   Laboratory: Recent Labs  Lab 09/10/18 1220 09/10/18 2139  WBC 7.5 6.1  HGB 13.1 11.8*  HCT 44.8 39.9  PLT 220 201   Recent Labs  Lab 09/10/18 1220 09/10/18 2139  NA 140  --   K 4.0  --   CL 100  --   CO2 28  --   BUN 13  --   CREATININE 1.11 1.09  CALCIUM 9.2  --   GLUCOSE 95  --      Imaging/Diagnostic Tests: Dg Chest 2 View  Result Date: 09/10/2018 CLINICAL DATA:  Chest pain for 2 days EXAM: CHEST - 2 VIEW COMPARISON:  09/04/2017 FINDINGS: Heart is normal size and configuration. Left coronary artery stent stable from the prior study. Normal mediastinal and hilar contours. Lungs are clear.  No pleural effusion or pneumothorax. Skeletal structures are intact. IMPRESSION: No active cardiopulmonary disease. Electronically Signed   By: Amie Portland M.D.   On: 09/10/2018 13:33    Meccariello, Solmon Ice, DO 09/11/2018, 7:47 AM PGY-1, [facility] FPTS Intern pager: [phone],  text pages welcome

## 2018-09-10 NOTE — Progress Notes (Signed)
   CC: chest pain  HPI: Dale [name] is a 77 y.o. male with PMH significant for NSTEMI, GERD, BPH, HLD who presents to [facility] today with intermittent chest pain worsening over several weeks duration.   He was out in his yard raking the other day when he noticed non-radiating burning pain in the middle of his chest which lasted 15 minutes and resolved when he sat down. He felt short of breath. It was very hot out. Chest pain went away on its own. The pain then moved to his stomach. This improved over the course of the afternoon. He did not take anything for this. No nausea, vomiting, or sweating. He has not had any similar episodes in the past. This feels different than when he had his heart attack. He does have a history of acid reflux charted but he cannot remember any episodes of this in the past.  As he sits in the office the patient reports he currently does have substernal chest pain. He reports the pain comes on "just talking about it." 8/10. No shortness of breath now.   These episodes of chest pain are happening when he starts talking to people. Frequency of episodes depends on how frequently he talks to people throughout the day. He feels the episodes of chest pain seem to be becoming more frequent.  Follows with cardiology yearly.  Stent placement of January of 2018.  Remote tobacco abuse. No cough, no swelling, no difficulty lying flat at night.  Review of Symptoms - see HPI PMH - Smoking status noted.    Review of Symptoms:  See HPI for ROS.   CC, SH/smoking status, and VS noted.  Objective: BP (!) 100/52   Pulse 66   Temp 98.2 F (36.8 C) (Oral)   Ht 5\' 4"  (1.626 m)   Wt 146 lb (66.2 kg)   SpO2 96%   BMI 25.06 kg/m  GEN: NAD, alert, cooperative, and pleasant. NECK: full ROM RESPIRATORY: clear to auscultation bilaterally with no wheezes, rhonchi or rales, good effort CV: RRR, no m/r/g, no peripheral edema GI: soft, non-tender, non-distended, no hepatosplenomegaly SKIN:  warm and dry, no rashes or lesions NEURO: II-XII grossly intact, normal gait, peripheral sensation intact PSYCH: AAOx3, appropriate affect  Assessment and plan:  Chest pain - concern for ACS, and patient is complaining of chest pain acutely in the office. He is high risk for a cardiac event given his history of NSTEMI with previous stent placement. No ST-T changes on EKG from office. He reports that he has not taken his medications today. We will send straight to the emergency department (one of our CMAs will wheel him there). - patient was given 325 mg ASA - patient was wheeled to the emergency department for ACS rule out  Howard Pouch, MD,MS,  PGY3 09/10/2018 10:41 AM

## 2018-09-10 NOTE — ED Notes (Signed)
Attempted report x1. 

## 2018-09-10 NOTE — Consult Note (Signed)
Cardiology Consultation:   Patient ID: Dale [name] MRN: [id number]; DOB: [birth year]  Admit date: 09/10/2018 Date of Consult: 09/10/2018  Primary Care Provider: Carney Living, MD Primary Cardiologist:  Allyson Sabal Primary Electrophysiologist:  None    History of Present Illness:   Dale [name] is a 77 y.o. male with a hx of CAD who is being seen today for the evaluation of chest pain  at the request of Dr Adela Lank.  Patient had complex intervention to totally occluded OM1 in January of 2018.  Stenting of inferior branch of large OM1 awith POB between struts of superior branch. Admitted with SEMI.  Has been having epigastric pain radiating to chest last couple of weeks. Not entirely like previous angina. Has not taken nitro for it. Can walk and do yard work with no issues. Worse when talking about kids. I think the primary issue is his grand daughter moving back into his house and bringing a bunch of "jokers" with her This seems to stress him out and make him want to move out of his own house.  Currently pain free troponin negative and ECG with no acute changes   Past Medical History:  Diagnosis Date  . Coronary artery disease   . Dementia   . GERD (gastroesophageal reflux disease)   . Hyperlipemia   . Leg mass    rt  . Memory loss   . Myocardial infarction less than 4 weeks ago (HCC) 12/29/2016  . Prostate disease   . Quit smoking 11/20/2008   Qualifier: Diagnosis of  By: Deirdre Priest MD, Gaynell Face    . S/P angioplasty with stent to LCX     Past Surgical History:  Procedure Laterality Date  . CARDIAC CATHETERIZATION N/A 12/30/2016   Procedure: Coronary Stent Intervention;  Surgeon: Runell Gess, MD;  Location: [facility] INVASIVE CV LAB;  Service: Cardiovascular;  Laterality: N/A;  . CARDIAC CATHETERIZATION N/A 12/30/2016   Procedure: Coronary Angiography;  Surgeon: Runell Gess, MD;  Location: [facility] INVASIVE CV LAB;  Service: Cardiovascular;  Laterality: N/A;  . CARDIAC CATHETERIZATION  N/A 12/30/2016   Procedure: Coronary Balloon Angioplasty;  Surgeon: Runell Gess, MD;  Location: [facility] INVASIVE CV LAB;  Service: Cardiovascular;  Laterality: N/A;     Home Medications:  Prior to Admission medications   Medication Sig Start Date End Date Taking? Authorizing Provider  aspirin 81 MG chewable tablet Chew 1 tablet (81 mg total) by mouth daily. 08/08/14  Yes Phelps, Sheran Spine Y, DO  donepezil (ARICEPT) 10 MG tablet TAKE 1 TABLET(10 MG) BY MOUTH AT BEDTIME Patient taking differently: Take 10 mg by mouth at bedtime.  03/27/18  Yes Nilda Riggs, NP  loratadine (CLARITIN) 10 MG tablet Take 10 mg by mouth daily.   Yes [provider]  memantine (NAMENDA) 10 MG tablet Take 1 tablet (10 mg total) by mouth 2 (two) times daily. 03/27/18  Yes Nilda Riggs, NP  nitroGLYCERIN (NITROSTAT) 0.4 MG SL tablet Place 1 tablet (0.4 mg total) under the tongue every 5 (five) minutes x 3 doses as needed for chest pain. 01/01/17  Yes Bhagat, Bhavinkumar, PA  pantoprazole (PROTONIX) 40 MG tablet Take 1 tablet (40 mg total) by mouth daily. 12/19/17  Yes Chambliss, Estill Batten, MD  pravastatin (PRAVACHOL) 40 MG tablet TAKE 1 TABLET(40 MG) BY MOUTH DAILY Patient taking differently: Take 40 mg by mouth daily.  03/05/18  Yes Chambliss, Estill Batten, MD  tamsulosin (FLOMAX) 0.4 MG CAPS capsule TAKE 1 CAPSULE(0.4 MG) BY MOUTH DAILY  AFTER SUPPER Patient taking differently: Take 0.4 mg by mouth daily after supper.  12/18/17  Yes Carney Living, MD  ticagrelor (BRILINTA) 90 MG TABS tablet Take 1 tablet (90 mg total) by mouth 2 (two) times daily. 01/03/18  Yes Tobey Grim, MD    Inpatient Medications: Scheduled Meds:  Continuous Infusions:  PRN Meds:   Allergies:   No Known Allergies  Social History:   Social History   Socioeconomic History  . Marital status: Widowed    Spouse name: Not on file  . Number of children: 2  . Years of education: 6th  . Highest education level: Not  on file  Occupational History  . Occupation: Retired  Engineer, production  . Financial resource strain: Not on file  . Food insecurity:    Worry: Not on file    Inability: Not on file  . Transportation needs:    Medical: Not on file    Non-medical: Not on file  Tobacco Use  . Smoking status: Former Smoker    Packs/day: 1.00    Years: 20.00    Pack years: 20.00    Types: Cigarettes    Last attempt to quit: 10/23/2011    Years since quitting: 6.8  . Smokeless tobacco: Never Used  Substance and Sexual Activity  . Alcohol use: No    Comment: occ  . Drug use: No  . Sexual activity: Not Currently    Comment: Patient would like to be. Discussed wearing condom if becomes sexually active  Lifestyle  . Physical activity:    Days per week: Not on file    Minutes per session: Not on file  . Stress: Not on file  Relationships  . Social connections:    Talks on phone: Not on file    Gets together: Not on file    Attends religious service: Not on file    Active member of club or organization: Not on file    Attends meetings of clubs or organizations: Not on file    Relationship status: Not on file  . Intimate partner violence:    Fear of current or ex partner: Not on file    Emotionally abused: Not on file    Physically abused: Not on file    Forced sexual activity: Not on file  Other Topics Concern  . Not on file  Social History Narrative   Past Medical History:      EST approximately 2002         Family History:      Sister had CABG in her 58s       Social History:      He is retired frmm working for city.  Does yard work with tractor and rides motorcycles. Has 2 children.  Sees his grandkids regularly.        Right-handed.   Occasional caffeine use.      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 Leward Quan, RN, BSN  Family History:    Family History  Problem Relation Age of Onset  . Cancer Mother        Posey Rea of type  . Heart attack Father   . Alzheimer's disease Father      ROS:  Please see the history of present illness.    All other ROS reviewed and negative.     Physical Exam/Data:   Vitals:   09/10/18 1207 09/10/18 1318 09/10/18 1400 09/10/18 1416  BP: 130/77 (!) 123/57 (!) 146/81 (!) 172/86  Pulse: (!) 47 (!) 49 (!) 47   Resp: 16 16 10    Temp: 98 F (36.7 C) 97.7 F (36.5 C)    TempSrc: Oral Oral    SpO2: 100% 100% 99% 100%   No intake or output data in the 24 hours ending 09/10/18 1458 There were no vitals filed for this visit. There is no height or weight on file to calculate BMI.  General:  Well nourished, well developed, in no acute distress  HEENT: normal Lymph: no adenopathy Neck: no JVD Endocrine:  No thryomegaly Vascular: No carotid bruits; FA pulses 2+ bilaterally without bruits  Cardiac:  normal S1, S2; RRR; no murmur   Lungs:  clear to auscultation bilaterally, no wheezing, rhonchi or rales  Abd: soft, nontender, no hepatomegaly  Ext: no edema Musculoskeletal:  No deformities, BUE and BLE strength normal and equal Skin: warm and dry  Neuro:  CNs 2-12 intact, no focal abnormalities noted Psych:  Normal affect   EKG:  The EKG was personally reviewed and demonstrates:  NSR /SB normal ECG  Telemetry:  Telemetry was personally reviewed and demonstrates:  NSR no arrhythmia   Relevant CV Studies: CAth 12/2016 100% OM1 with DES to inferior branch and POB through struts to  superior branch   Laboratory Data:  Chemistry Recent Labs  Lab 09/10/18 1220  NA 140  K 4.0  CL 100  CO2 28  GLUCOSE 95  BUN 13  CREATININE 1.11  CALCIUM 9.2  GFRNONAA >60  GFRAA >60  ANIONGAP 12    No results for input(s): PROT, ALBUMIN, AST, ALT, ALKPHOS, BILITOT in the last 168 hours. Hematology Recent Labs  Lab 09/10/18 1220  WBC 7.5  RBC 4.93  HGB 13.1  HCT 44.8  MCV 90.9  MCH 26.6  MCHC 29.2*  RDW 12.4  PLT 220   Cardiac EnzymesNo results for input(s): TROPONINI in the last 168 hours.  Recent Labs  Lab 09/10/18 1229  TROPIPOC 0.00    BNPNo results for input(s): BNP, PROBNP in the last 168 hours.  DDimer No results for input(s): DDIMER in the last 168 hours.  Radiology/Studies:  Dg Chest 2 View  Result Date: 09/10/2018 CLINICAL DATA:  Chest pain for 2 days EXAM: CHEST - 2 VIEW COMPARISON:  09/04/2017 FINDINGS: Heart is normal size and configuration. Left coronary artery stent stable from the prior study. Normal mediastinal and hilar contours. Lungs are clear.  No pleural effusion or pneumothorax. Skeletal structures are intact. IMPRESSION: No active cardiopulmonary disease. Electronically Signed   By: Amie Portland M.D.   On: 09/10/2018 13:33    Assessment and Plan:   1. Chest Pain:  Atypical r/o no ECG changes seems to be brought on by mental stress of grand daughter moving back into house. Given circumflex location ( can be silent on ECG) favor admit For r/o and perform lexiscan myovue in am R/O serial ECG and troponin add nitrates 2. Bradycardia:  Stop aricept  3. HLD:  Continue statin  4. Anxiety:  Needs some sort of help with home situation and dealing with children per primary service       For questions or updates, please contact [facility] Please consult [email or web address] for contact info under     Signed, Charlton Haws, MD  09/10/2018 2:58 PM

## 2018-09-10 NOTE — H&P (Addendum)
Family Medicine Teaching [facility] Admission History and Physical Service Pager: [phone]  [name] Medical record number: [id number] Date of birth: 06-08-1941 Age: 77 y.o. Gender: male  Primary Care Provider: Carney Living, MD Consultants: Cards Code Status: Full  Chief Complaint:   Assessment and Plan: Dale [name] is a 77 y.o. male presenting with abdominal pain while talking to people. PMH is significant for previous NSTEMI with PCI Jan 2018, GERD, HTN, Dementia, and BPH  Chest Pain: ACS rule out with history of stent in January 2018.  Patient remains on ticagrelor 90mg  BID and ASA. istat Troponin 0.03 and trop I negative on admission. HEART score 5 and no ST elevations on EKG. Chest Xray shows no cardiopulmonary disease. CBC and BMP wnl.  Differential diagnosis also includes atypical chest pain as patient states this feels very different from his NSTEMI in Jan 2018.  Due to patient's dementia as well, it is unclear if patient is compliant with medications.  The symptoms could also be due to acid reflux considering burning nature and abdominal focus as well as negative troponins x2. Suspect a large anxiety component given worsens with patient talking about his chest pain or his stressful home situation. - Admit to tele, attending Dr. Lum Babe - Cardiology consulted in ED - appreciate recs. Plan for Lexiscan Myoview 10/1. Start imdur.  - trend trops - Cardiac monitoring, continuous pulse ox - Vitals - NPO for now - Am BMP, CBC, EKG - Zofran PRN Nausea - Nitro subling PRN CP - continue home ASA and brilinta - PT/OT  Bradycardia. EKG on admit HR in 40s with 1st degree AV block - hold aricept per cards - monitor on tele - avoid AV node blocking agents   GERD: takes Protonix 40mg . Patient unsure if he is taking. - Protonix 40mg  qd  Dementia: Patient Memantine 10 MG tablet BID  - continue home namenda - holding aricept for bradycardia  HLD:  pravastatin 40mg  at home. Last lipid panel 12/2016 WNL. - Repeat Lipid Panel  -continue home pravastatin  BPH: Tamsulosin 0.4 MG CAPS capsule. - Continue home med  Allergies: Loratadine 10mg  qDaily - Continue home med  FEN/GI: Heart healthy diet, then NPO at midnight, GI Cocktail Prophylaxis: Lovenox  Disposition: Admit to tele  History of Present Illness:  Dale [name] is a 77 y.o. male presenting with 2 months of intermittent abdominal pain, acutely worse since Saturday 9/28.  He states the episodes come and go, and frequently occur when he is talking to people, especially about stressful topics.  He had the same abdominal pain while raking leaves recently, at which point he states he also became very sweaty.  He states he is not sure if this was caused by the heat and humidity or something else.  The patient states he had an NSTEMI in January 2018 that caused abdominal and chest pain to suddenly well up and feel like a crushing pain in his chest.  He states the pain he has now feels differently in that it starts gradually, comes and goes, and can happen when hes talking about something, especially stressful topics. Resting/stopping talking makes the pain go away or reduce in intensity. The pain starts in the bottom of his abdomen and moves through his abdomen towards his chest, where it remains in the epigastric region like a pit.  Reports the pain will make its way into the chest if he keeps talking. He describes it as burning that "builds up". He  doesn't feel like anything "comes up".   During the episiodes he denies HA, chest pain, nausea, vomiting, sweating, shortness of breath, or radiating pain into is left arm or jaw. He reports abdominal discomfort.   Of note, his daughter most recently moved close to him about 2 months ago.  The 2 of them do not get along and she is a constant source of stress for him.  He is often at odds with his 2 grown children because they want to make  decisions for him.  Review Of Systems: Review of Systems  Constitutional: Negative for chills and fever.  Respiratory: Negative for cough.   Cardiovascular: Negative for chest pain and palpitations.  Gastrointestinal: Positive for abdominal pain (Burning pain). Negative for constipation, diarrhea, nausea and vomiting.  Genitourinary: Negative for dysuria.  Neurological: Negative for headaches.    Patient Active Problem List   Diagnosis Date Noted  . CAD (coronary artery disease) 09/10/2018  . Chest discomfort 05/15/2018  . Memory loss 03/27/2018  . NSTEMI (non-ST elevated myocardial infarction) (HCC) 12/30/2016  . Mild cognitive impairment 07/27/2016  . HLD (hyperlipidemia) 02/04/2015  . BPH (benign prostatic hyperplasia) 10/15/2014  . GERD (gastroesophageal reflux disease) 08/07/2014    Past Medical History: Past Medical History:  Diagnosis Date  . Coronary artery disease   . Dementia   . GERD (gastroesophageal reflux disease)   . Hyperlipemia   . Leg mass    rt  . Memory loss   . Myocardial infarction less than 4 weeks ago (HCC) 12/29/2016  . Prostate disease   . Quit smoking 11/20/2008   Qualifier: Diagnosis of  By: Deirdre Priest MD, Gaynell Face    . S/P angioplasty with stent to LCX     Past Surgical History: Past Surgical History:  Procedure Laterality Date  . CARDIAC CATHETERIZATION N/A 12/30/2016   Procedure: Coronary Stent Intervention;  Surgeon: Runell Gess, MD;  Location: [facility] INVASIVE CV LAB;  Service: Cardiovascular;  Laterality: N/A;  . CARDIAC CATHETERIZATION N/A 12/30/2016   Procedure: Coronary Angiography;  Surgeon: Runell Gess, MD;  Location: [facility] INVASIVE CV LAB;  Service: Cardiovascular;  Laterality: N/A;  . CARDIAC CATHETERIZATION N/A 12/30/2016   Procedure: Coronary Balloon Angioplasty;  Surgeon: Runell Gess, MD;  Location: [facility] INVASIVE CV LAB;  Service: Cardiovascular;  Laterality: N/A;    Social History: Social History   Tobacco Use  .  Smoking status: Former Smoker    Packs/day: 1.00    Years: 20.00    Pack years: 20.00    Types: Cigarettes    Last attempt to quit: 10/23/2011    Years since quitting: 6.8  . Smokeless tobacco: Never Used  Substance Use Topics  . Alcohol use: No    Comment: occ  . Drug use: No   Please also refer to relevant sections of EMR.  Family History: Family History  Problem Relation Age of Onset  . Cancer Mother        Posey Rea of type  . Heart attack Father   . Alzheimer's disease Father    Allergies and Medications: No Known Allergies No current facility-administered medications on file prior to encounter.    Current Outpatient Medications on File Prior to Encounter  Medication Sig Dispense Refill  . aspirin 81 MG chewable tablet Chew 1 tablet (81 mg total) by mouth daily. 30 tablet 3  . donepezil (ARICEPT) 10 MG tablet TAKE 1 TABLET(10 MG) BY MOUTH AT BEDTIME (Patient taking differently: Take 10 mg by mouth at bedtime. )  90 tablet 3  . loratadine (CLARITIN) 10 MG tablet Take 10 mg by mouth daily.    . memantine (NAMENDA) 10 MG tablet Take 1 tablet (10 mg total) by mouth 2 (two) times daily. 180 tablet 3  . nitroGLYCERIN (NITROSTAT) 0.4 MG SL tablet Place 1 tablet (0.4 mg total) under the tongue every 5 (five) minutes x 3 doses as needed for chest pain. 25 tablet 12  . pantoprazole (PROTONIX) 40 MG tablet Take 1 tablet (40 mg total) by mouth daily. 90 tablet 1  . pravastatin (PRAVACHOL) 40 MG tablet TAKE 1 TABLET(40 MG) BY MOUTH DAILY (Patient taking differently: Take 40 mg by mouth daily. ) 90 tablet 0  . tamsulosin (FLOMAX) 0.4 MG CAPS capsule TAKE 1 CAPSULE(0.4 MG) BY MOUTH DAILY AFTER SUPPER (Patient taking differently: Take 0.4 mg by mouth daily after supper. ) 90 capsule 2  . ticagrelor (BRILINTA) 90 MG TABS tablet Take 1 tablet (90 mg total) by mouth 2 (two) times daily. 180 tablet 0   Objective: BP (!) 172/86   Pulse (!) 47   Temp 97.7 F (36.5 C) (Oral)   Resp 10   SpO2  100%   Physical Exam  Constitutional: He appears well-developed and well-nourished. He does not appear ill.  HENT:  Head: Normocephalic and atraumatic.  Cardiovascular: Regular rhythm, intact distal pulses and normal pulses. Bradycardia present.  HR in 40-50s  Pulmonary/Chest: Effort normal and breath sounds normal.  Abdominal: Soft. Bowel sounds are normal.  Musculoskeletal: Normal range of motion.  Neurological: He is alert.  Skin: Skin is warm. Capillary refill takes less than 2 seconds.  Psychiatric: He has a normal mood and affect.   Labs and Imaging: CBC BMET  Recent Labs  Lab 09/10/18 1220  WBC 7.5  HGB 13.1  HCT 44.8  PLT 220   Recent Labs  Lab 09/10/18 1220  NA 140  K 4.0  CL 100  CO2 28  BUN 13  CREATININE 1.11  GLUCOSE 95  CALCIUM 9.2     Troponin (Point of Care Test) Recent Labs    09/10/18 1229  TROPIPOC 0.00    EKG 1st degree AV block, sinus bradycardia  Dg Chest 2 View  Result Date: 09/10/2018 CLINICAL DATA:  Chest pain for 2 days EXAM: CHEST - 2 VIEW COMPARISON:  09/04/2017 FINDINGS: Heart is normal size and configuration. Left coronary artery stent stable from the prior study. Normal mediastinal and hilar contours. Lungs are clear.  No pleural effusion or pneumothorax. Skeletal structures are intact. IMPRESSION: No active cardiopulmonary disease. Electronically Signed   By: Amie Portland M.D.   On: 09/10/2018 13:33   Dollene Cleveland, DO 09/10/2018, 3:08 PM PGY-1, [facility] FPTS Intern pager: [phone], text pages welcome  FPTS Upper-Level Resident Addendum  I have independently interviewed and examined the patient. I have discussed the above with the original author and agree with their documentation. My edits for correction/addition/clarification are in blue. Please see also any attending notes.   Leland Her, DO PGY-3, [facility] 09/10/2018 7:09 PM  FPTS Service pager: [phone] (text pages welcome  through AMION)

## 2018-09-10 NOTE — ED Provider Notes (Signed)
[facility] EMERGENCY DEPARTMENT Provider Note   CSN: [id number] Arrival date & time: 09/10/18  1201     History   Chief Complaint Chief Complaint  Patient presents with  . Chest Pain    HPI JAVALE [name] is a 77 y.o. male.  77 yo M with a chief complaint of chest pain.  This feels like when he had his prior stent placed back in January.  Feels discomfort across the anterior part of his chest.  This is less persistent than it was then.  He has had some mild shortness of breath with it as well.  He feels that it starts usually when he is having a conversation that makes him stressed out he feels that it starts low in his abdomen and worked up into the chest.  Denies diaphoresis nausea or vomiting.  As he is sitting still and resting it gets better.  Has not noticed any exertional symptoms.  The history is provided by the patient.  Chest Pain   This is a new problem. The current episode started more than 1 week ago. The problem occurs constantly. The problem has been gradually worsening. The pain is present in the substernal region. The pain is at a severity of 2/10. The pain is moderate. The quality of the pain is described as brief. The pain does not radiate. Duration of episode(s) is 1 week. Associated symptoms include abdominal pain. Pertinent negatives include no fever, no headaches, no palpitations, no shortness of breath and no vomiting. He has tried nothing for the symptoms. The treatment provided no relief.  His past medical history is significant for MI.    Past Medical History:  Diagnosis Date  . Coronary artery disease   . Dementia   . GERD (gastroesophageal reflux disease)   . Hyperlipemia   . Leg mass    rt  . Memory loss   . Myocardial infarction less than 4 weeks ago (HCC) 12/29/2016  . Prostate disease   . Quit smoking 11/20/2008   Qualifier: Diagnosis of  By: Deirdre Priest MD, Gaynell Face    . S/P angioplasty with stent to LCX     Patient Active  Problem List   Diagnosis Date Noted  . CAD (coronary artery disease) 09/10/2018  . Chest discomfort 05/15/2018  . Memory loss 03/27/2018  . NSTEMI (non-ST elevated myocardial infarction) (HCC) 12/30/2016  . Mild cognitive impairment 07/27/2016  . HLD (hyperlipidemia) 02/04/2015  . BPH (benign prostatic hyperplasia) 10/15/2014  . GERD (gastroesophageal reflux disease) 08/07/2014    Past Surgical History:  Procedure Laterality Date  . CARDIAC CATHETERIZATION N/A 12/30/2016   Procedure: Coronary Stent Intervention;  Surgeon: Runell Gess, MD;  Location: [facility] INVASIVE CV LAB;  Service: Cardiovascular;  Laterality: N/A;  . CARDIAC CATHETERIZATION N/A 12/30/2016   Procedure: Coronary Angiography;  Surgeon: Runell Gess, MD;  Location: [facility] INVASIVE CV LAB;  Service: Cardiovascular;  Laterality: N/A;  . CARDIAC CATHETERIZATION N/A 12/30/2016   Procedure: Coronary Balloon Angioplasty;  Surgeon: Runell Gess, MD;  Location: [facility] INVASIVE CV LAB;  Service: Cardiovascular;  Laterality: N/A;        Home Medications    Prior to Admission medications   Medication Sig Start Date End Date Taking? Authorizing Provider  aspirin 81 MG chewable tablet Chew 1 tablet (81 mg total) by mouth daily. 08/08/14  Yes Caryl Ada Y, DO  donepezil (ARICEPT) 10 MG tablet TAKE 1 TABLET(10 MG) BY MOUTH AT BEDTIME Patient taking differently: Take 10 mg  by mouth at bedtime.  03/27/18  Yes Nilda Riggs, NP  loratadine (CLARITIN) 10 MG tablet Take 10 mg by mouth daily.   Yes [provider]  memantine (NAMENDA) 10 MG tablet Take 1 tablet (10 mg total) by mouth 2 (two) times daily. 03/27/18  Yes Nilda Riggs, NP  nitroGLYCERIN (NITROSTAT) 0.4 MG SL tablet Place 1 tablet (0.4 mg total) under the tongue every 5 (five) minutes x 3 doses as needed for chest pain. 01/01/17  Yes Bhagat, Bhavinkumar, PA  pantoprazole (PROTONIX) 40 MG tablet Take 1 tablet (40 mg total) by mouth daily. 12/19/17  Yes  Chambliss, Estill Batten, MD  pravastatin (PRAVACHOL) 40 MG tablet TAKE 1 TABLET(40 MG) BY MOUTH DAILY Patient taking differently: Take 40 mg by mouth daily.  03/05/18  Yes Chambliss, Estill Batten, MD  tamsulosin (FLOMAX) 0.4 MG CAPS capsule TAKE 1 CAPSULE(0.4 MG) BY MOUTH DAILY AFTER SUPPER Patient taking differently: Take 0.4 mg by mouth daily after supper.  12/18/17  Yes Carney Living, MD  ticagrelor (BRILINTA) 90 MG TABS tablet Take 1 tablet (90 mg total) by mouth 2 (two) times daily. 01/03/18  Yes Tobey Grim, MD    Family History Family History  Problem Relation Age of Onset  . Cancer Mother        Posey Rea of type  . Heart attack Father   . Alzheimer's disease Father     Social History Social History   Tobacco Use  . Smoking status: Former Smoker    Packs/day: 1.00    Years: 20.00    Pack years: 20.00    Types: Cigarettes    Last attempt to quit: 10/23/2011    Years since quitting: 6.8  . Smokeless tobacco: Never Used  Substance Use Topics  . Alcohol use: No    Comment: occ  . Drug use: No     Allergies   Patient has no known allergies.   Review of Systems Review of Systems  Constitutional: Negative for chills and fever.  HENT: Negative for congestion and facial swelling.   Eyes: Negative for discharge and visual disturbance.  Respiratory: Negative for shortness of breath.   Cardiovascular: Positive for chest pain. Negative for palpitations.  Gastrointestinal: Positive for abdominal pain. Negative for diarrhea and vomiting.  Musculoskeletal: Negative for arthralgias and myalgias.  Skin: Negative for color change and rash.  Neurological: Negative for tremors, syncope and headaches.  Psychiatric/Behavioral: Negative for confusion and dysphoric mood.     Physical Exam Updated Vital Signs BP (!) 171/92   Pulse (!) 51   Temp 97.7 F (36.5 C) (Oral)   Resp 16   SpO2 100%   Physical Exam  Constitutional: He is oriented to person, place, and time. He  appears well-developed and well-nourished.  HENT:  Head: Normocephalic and atraumatic.  Eyes: Pupils are equal, round, and reactive to light. EOM are normal.  Neck: Normal range of motion. Neck supple. No JVD present.  Cardiovascular: Normal rate and regular rhythm. Exam reveals no gallop and no friction rub.  No murmur heard. Pulmonary/Chest: No respiratory distress. He has no wheezes.  Abdominal: He exhibits no distension and no mass. There is no tenderness. There is no rebound and no guarding.  Benign abdominal exam, negative murphys  Musculoskeletal: Normal range of motion.  Neurological: He is alert and oriented to person, place, and time.  Skin: No rash noted. No pallor.  Psychiatric: He has a normal mood and affect. His behavior is normal.  Nursing  note and vitals reviewed.    ED Treatments / Results  Labs (all labs ordered are listed, but only abnormal results are displayed) Labs Reviewed  CBC - Abnormal; Notable for the following components:      Result Value   MCHC 29.2 (*)    All other components within normal limits  BASIC METABOLIC PANEL  I-STAT TROPONIN, ED    EKG EKG Interpretation  Date/Time:  Monday September 10 2018 12:05:08 EDT Ventricular Rate:  50 PR Interval:  256 QRS Duration: 94 QT Interval:  440 QTC Calculation: 401 R Axis:   55 Text Interpretation:  Sinus bradycardia with 1st degree A-V block Nonspecific T wave abnormality Abnormal ECG No significant change since last tracing Confirmed by Melene Plan [phone]) on 09/10/2018 2:02:12 PM   Radiology Dg Chest 2 View  Result Date: 09/10/2018 CLINICAL DATA:  Chest pain for 2 days EXAM: CHEST - 2 VIEW COMPARISON:  09/04/2017 FINDINGS: Heart is normal size and configuration. Left coronary artery stent stable from the prior study. Normal mediastinal and hilar contours. Lungs are clear.  No pleural effusion or pneumothorax. Skeletal structures are intact. IMPRESSION: No active cardiopulmonary disease.  Electronically Signed   By: Amie Portland M.D.   On: 09/10/2018 13:33    Procedures Procedures (including critical care time)  Medications Ordered in ED Medications - No data to display   Initial Impression / Assessment and Plan / ED Course  I have reviewed the triage vital signs and the nursing notes.  Pertinent labs & imaging results that were available during my care of the patient were reviewed by me and considered in my medical decision making (see chart for details).     77 yo M with a chief complaint of chest pain.  Going on for the past week and a half or so.  Seems to come and go.  Usually occurs when he is talking about something that is stressful in his life.  Improves when he rests.  Discussed with Dr. Eden Emms, will come and eval patient, recommended fam med admit.   The patients results and plan were reviewed and discussed.   Any x-rays performed were independently reviewed by myself.   Differential diagnosis were considered with the presenting HPI.  Medications - No data to display  Vitals:   09/10/18 1416 09/10/18 1430 09/10/18 1445 09/10/18 1500  BP: (!) 172/86 (!) 167/75 (!) 161/69 (!) 171/92  Pulse:  (!) 46 (!) 43 (!) 51  Resp:  13 11 16   Temp:      TempSrc:      SpO2: 100% 100% 100% 100%    Final diagnoses:  Chest pain with minimal risk for cardiac etiology    Admission/ observation were discussed with the admitting physician, patient and/or family and they are comfortable with the plan.    Final Clinical Impressions(s) / ED Diagnoses   Final diagnoses:  Chest pain with minimal risk for cardiac etiology    ED Discharge Orders    None       Melene Plan, DO 09/10/18 1521

## 2018-09-10 NOTE — Patient Instructions (Addendum)
It was a pleasure seeing you today in our clinic.   We need to make sure you are not having a heart attack. You will be taken straight to the emergency room.  Our clinic's number is [phone]. Please call with questions or concerns about what we discussed today.  Be well, Dr. Mosetta Putt

## 2018-09-10 NOTE — Progress Notes (Signed)
Pt received from ED. No CP at this time. A/ox2. CHG complete. Telemetry applied. VSS. Dinner tray ordered. Will continue to monitor.  Versie Starks, RN

## 2018-09-11 ENCOUNTER — Other Ambulatory Visit: Payer: Self-pay

## 2018-09-11 ENCOUNTER — Observation Stay (HOSPITAL_COMMUNITY): Payer: Medicare HMO

## 2018-09-11 ENCOUNTER — Encounter (HOSPITAL_COMMUNITY): Payer: Self-pay | Admitting: General Practice

## 2018-09-11 ENCOUNTER — Other Ambulatory Visit: Payer: Self-pay | Admitting: Family Medicine

## 2018-09-11 DIAGNOSIS — R001 Bradycardia, unspecified: Secondary | ICD-10-CM | POA: Diagnosis not present

## 2018-09-11 DIAGNOSIS — R079 Chest pain, unspecified: Secondary | ICD-10-CM | POA: Diagnosis not present

## 2018-09-11 DIAGNOSIS — I252 Old myocardial infarction: Secondary | ICD-10-CM | POA: Diagnosis not present

## 2018-09-11 DIAGNOSIS — F0390 Unspecified dementia without behavioral disturbance: Secondary | ICD-10-CM | POA: Diagnosis not present

## 2018-09-11 DIAGNOSIS — N401 Enlarged prostate with lower urinary tract symptoms: Secondary | ICD-10-CM | POA: Diagnosis not present

## 2018-09-11 DIAGNOSIS — K219 Gastro-esophageal reflux disease without esophagitis: Secondary | ICD-10-CM | POA: Diagnosis not present

## 2018-09-11 DIAGNOSIS — Z9109 Other allergy status, other than to drugs and biological substances: Secondary | ICD-10-CM | POA: Diagnosis not present

## 2018-09-11 DIAGNOSIS — I2510 Atherosclerotic heart disease of native coronary artery without angina pectoris: Secondary | ICD-10-CM | POA: Diagnosis not present

## 2018-09-11 DIAGNOSIS — I25119 Atherosclerotic heart disease of native coronary artery with unspecified angina pectoris: Secondary | ICD-10-CM | POA: Diagnosis not present

## 2018-09-11 DIAGNOSIS — E785 Hyperlipidemia, unspecified: Secondary | ICD-10-CM | POA: Diagnosis not present

## 2018-09-11 DIAGNOSIS — R4189 Other symptoms and signs involving cognitive functions and awareness: Secondary | ICD-10-CM | POA: Diagnosis not present

## 2018-09-11 DIAGNOSIS — N400 Enlarged prostate without lower urinary tract symptoms: Secondary | ICD-10-CM | POA: Diagnosis not present

## 2018-09-11 LAB — TROPONIN I: Troponin I: <0.03 ng/mL (ref <0.03)

## 2018-09-11 LAB — LIPID PANEL
CHOL/HDL RATIO: 2.4 ratio
CHOLESTEROL: 153 mg/dL (ref 0–200)
HDL: 65 mg/dL (ref >40)
LDL Cholesterol: 70 mg/dL (ref 0–99)
Triglycerides: 91 mg/dL (ref <150)
VLDL: 18 mg/dL (ref 0–40)

## 2018-09-11 LAB — NM MYOCAR MULTI W/SPECT W/WALL MOTION / EF
CSEPED: 5 min
CSEPEDS: 0 s
CSEPEW: 1 METS
MPHR: 145 {beats}/min
Peak HR: 80 {beats}/min
Percent HR: 55 %
Rest HR: 70 {beats}/min

## 2018-09-11 LAB — TSH: TSH: 1.785 u[IU]/mL (ref 0.350–4.500)

## 2018-09-11 MED ORDER — ISOSORBIDE MONONITRATE ER 30 MG PO TB24: 15.0000 mg | ORAL_TABLET | Freq: Every day | ORAL | 0 refills | Status: DC

## 2018-09-11 MED ORDER — TECHNETIUM TC 99M TETROFOSMIN IV KIT
10.0000 | PACK | Freq: Once | INTRAVENOUS | Status: AC | PRN
  Administered 2018-09-11: 10 via INTRAVENOUS

## 2018-09-11 MED ORDER — TECHNETIUM TC 99M TETROFOSMIN IV KIT
30.0000 | PACK | Freq: Once | INTRAVENOUS | Status: AC | PRN
  Administered 2018-09-11: 30 via INTRAVENOUS

## 2018-09-11 MED ORDER — REGADENOSON 0.4 MG/5ML IV SOLN
INTRAVENOUS | Status: AC
  Filled 2018-09-11: qty 5

## 2018-09-11 MED ORDER — REGADENOSON 0.4 MG/5ML IV SOLN
0.4000 mg | Freq: Once | INTRAVENOUS | Status: AC
  Administered 2018-09-11: 0.4 mg via INTRAVENOUS

## 2018-09-11 NOTE — Progress Notes (Addendum)
OT Cancellation Note  Patient Details Name: Dale [name] MRN: [id number] DOB: [birth year]   Cancelled Treatment:    Reason Eval/Treat Not Completed: Patient at procedure or test/ unavailable. Will reattempt.  Jeani Hawking, OTR/L Acute Rehabilitation Services Pager [phone] Office [phone]   Jeani Hawking M 09/11/2018, 10:16 AM

## 2018-09-11 NOTE — Progress Notes (Signed)
Progress Note  [name] Date of Encounter: 09/11/2018  Primary Cardiologist: No primary care provider on file. [place]   Subjective   Sitting in chair no chest pain   Inpatient Medications    Scheduled Meds: . aspirin  81 mg Oral Daily  . enoxaparin (LOVENOX) injection  40 mg Subcutaneous Q24H  . isosorbide mononitrate  15 mg Oral Daily  . loratadine  10 mg Oral Daily  . memantine  10 mg Oral BID  . pantoprazole  40 mg Oral Daily  . pravastatin  40 mg Oral Daily  . tamsulosin  0.4 mg Oral QPC supper  . ticagrelor  90 mg Oral BID   Continuous Infusions:  PRN Meds: acetaminophen, gi cocktail, nitroGLYCERIN, ondansetron (ZOFRAN) IV   Vital Signs    Vitals:   09/10/18 1946 09/11/18 0321 09/11/18 0346 09/11/18 0756  BP: (!) 129/91 103/64  101/65  Pulse: (!) 58 (!) 27  (!) 55  Resp: (!) 23 (!) 22  20  Temp: 98.8 F (37.1 C) 98.9 F (37.2 C)  97.9 F (36.6 C)  TempSrc: Oral Oral  Oral  SpO2: 99% 99%  99%  Weight:   64.1 kg     Intake/Output Summary (Last 24 hours) at 09/11/2018 0834 Last data filed at 09/11/2018 6578 Gross per 24 hour  Intake 120 ml  Output 950 ml  Net -830 ml   Filed Weights   09/11/18 0346  Weight: 64.1 kg    Telemetry    NSR 09/11/2018  - Personally Reviewed  ECG    NSR no acute changes  - Personally Reviewed  Physical Exam  Black male no distress  GEN: No acute distress.   Neck: No JVD Cardiac: RRR, no murmurs, rubs, or gallops.  Respiratory: Clear to auscultation bilaterally. GI: Soft, nontender, non-distended  MS: No edema; No deformity. Neuro:  Nonfocal  Psych: Normal affect   Labs    Chemistry Recent Labs  Lab 09/10/18 1220 09/10/18 2139  NA 140  --   K 4.0  --   CL 100  --   CO2 28  --   GLUCOSE 95  --   BUN 13  --   CREATININE 1.11 1.09  CALCIUM 9.2  --   GFRNONAA >60 >60  GFRAA >60 >60  ANIONGAP 12  --      Hematology Recent Labs  Lab 09/10/18 1220 09/10/18 2139  WBC 7.5 6.1    RBC 4.93 4.42  HGB 13.1 11.8*  HCT 44.8 39.9  MCV 90.9 90.3  MCH 26.6 26.7  MCHC 29.2* 29.6*  RDW 12.4 12.5  PLT 220 201    Cardiac Enzymes Recent Labs  Lab 09/10/18 1607 09/10/18 2139 09/11/18 0354  TROPONINI <0.03 <0.03 <0.03    Recent Labs  Lab 09/10/18 1229  TROPIPOC 0.00     BNPNo results for input(s): BNP, PROBNP in the last 168 hours.   DDimer No results for input(s): DDIMER in the last 168 hours.   Radiology    Dg Chest 2 View  Result Date: 09/10/2018 CLINICAL DATA:  Chest pain for 2 days EXAM: CHEST - 2 VIEW COMPARISON:  09/04/2017 FINDINGS: Heart is normal size and configuration. Left coronary artery stent stable from the prior study. Normal mediastinal and hilar contours. Lungs are clear.  No pleural effusion or pneumothorax. Skeletal structures are intact. IMPRESSION: No active cardiopulmonary disease. Electronically Signed   By: Amie Portland M.D.   On: 09/10/2018 13:33    Cardiac  Studies   Myovue pending   Patient Profile     77 y.o. male with history of complex stenting of totally occluded circ/OM January 2018 no significant Disease in RCA/LAD admitted with chest pain   Assessment & Plan    1. Chest Pain:  R/o no ECG changes pain free for myovue today Ok to d/c hime if low risk/ non ischemic  2. Bradycardia:  Stop aricept and follow  3. HLD:  Continue statin  4. Anxiety:  Needs help with home situation being displaced by daughter   [facility] will sign off.  If myovue normal can be d/c  Medication Recommendations:  Same  Other recommendations (labs, testing, etc):  Stop Aricept due to bradycardia  Follow up as an outpatient:  Allyson Sabal   For questions or updates, please contact [facility] Please consult [email or web address] for contact info under        Signed, Charlton Haws, MD  09/11/2018, 8:34 AM

## 2018-09-11 NOTE — Discharge Instructions (Signed)
Chest Wall Pain °Chest wall pain is pain in or around the bones and muscles of your chest. Sometimes, an injury causes this pain. Sometimes, the cause may not be known. This pain may take several weeks or longer to get better. °Follow these instructions at home: °Pay attention to any changes in your symptoms. Take these actions to help with your pain: °· Rest as told by your doctor. °· Avoid activities that cause pain. Try not to use your chest, belly (abdominal), or side muscles to lift heavy things. °· If directed, apply ice to the painful area: °? Put ice in a plastic bag. °? Place a towel between your skin and the bag. °? Leave the ice on for 20 minutes, 2-3 times per day. °· Take over-the-counter and prescription medicines only as told by your doctor. °· Do not use tobacco products, including cigarettes, chewing tobacco, and e-cigarettes. If you need help quitting, ask your doctor. °· Keep all follow-up visits as told by your doctor. This is important. ° °Contact a doctor if: °· You have a fever. °· Your chest pain gets worse. °· You have new symptoms. °Get help right away if: °· You feel sick to your stomach (nauseous) or you throw up (vomit). °· You feel sweaty or light-headed. °· You have a cough with phlegm (sputum) or you cough up blood. °· You are short of breath. °This information is not intended to replace advice given to you by your health care provider. Make sure you discuss any questions you have with your health care provider. °Document Released: 05/16/2008 Document Revised: 05/05/2016 Document Reviewed: 02/23/2015 °Elsevier Interactive Patient Education © 2018 Elsevier Inc. ° °

## 2018-09-11 NOTE — Progress Notes (Signed)
Patient denies any complaints during or post stress test.

## 2018-09-11 NOTE — Discharge Summary (Signed)
Family Medicine Teaching [facility] Discharge Summary  [name] Medical record number: [id number] Date of birth: 05-04-1941 Age: 77 y.o. Gender: male Date of Admission: 09/10/2018  Date of Discharge: 09/11/2018 Admitting Physician: Westley Chandler, MD  Primary Care Provider: Carney Living, MD Consultants: Cardiology  Indication for Hospitalization: abdominal pain radiating to chest  Discharge Diagnoses/Problem List:  Chest Pain Bradycardia GERD HLD BPH Allergies   Disposition: home  Discharge Condition: stable  Discharge Exam:  General: 77 y.o. male in NAD Cardio: Bradycardic to 72s, no m/r/g Lungs: CTAB, no wheezing, no rhonchi, no crackles Abdomen: Soft, non-tender to palpation, positive bowel sounds Skin: warm and dry Extremities: No edema   Brief Hospital Course:  Dale [name] a 77 y.o.malepresenting with abdominal pain whiletalking to people. PMH is significant forpreviousNSTEMI with PCI Jan 2018, GERD, HTN, Dementia,andBPH.  His hospital course is outline below.  Patient admitted for observation and cardiac work up given worsening abdominal pain that radiated to his chest.  He stated that this had worsened following his daughter moving in with him and that they are not getting along well.  Troponins were negative, EKG showed a first degree AV block without ST elevations.  TSH was rechecked and WNL.  Cardiology was consulted and recommended starting imdur, discontinuation of Aricept given his bradycardia and to perform lexiscan myovue, which was normal.  His chest pain resolved spontaneously.  His vital signs were stable and he was medically cleared for discharge.  He should follow up with cardiology as an outpatient.  As it seems this episode of pain may have been brought on by anxiety, it is recommended that his PCP consider re-assessing his anxiety as outpatient.  Issues for Follow Up:  1. Consider re-assessment for need for  DAPT. 2. Aricept discontinued in the setting of bradycardia. 3. Will need follow up with cardiology. 4. Follow up and continued management of bradycardia. 5. Will need follow up of possible anxiety and his home situation.  Significant Procedures: Lexiscan Myovue  Significant Labs and Imaging:  Recent Labs  Lab 09/10/18 1220 09/10/18 2139  WBC 7.5 6.1  HGB 13.1 11.8*  HCT 44.8 39.9  PLT 220 201   Recent Labs  Lab 09/10/18 1220 09/10/18 2139  NA 140  --   K 4.0  --   CL 100  --   CO2 28  --   GLUCOSE 95  --   BUN 13  --   CREATININE 1.11 1.09  CALCIUM 9.2  --     Troponins negative x3 TSH 1.785 Lipid Panel     Component Value Date/Time   CHOL 153 09/11/2018 0354   TRIG 91 09/11/2018 0354   HDL 65 09/11/2018 0354   CHOLHDL 2.4 09/11/2018 0354   VLDL 18 09/11/2018 0354   LDLCALC 70 09/11/2018 0354    Dg Chest 2 View  Result Date: 09/10/2018 CLINICAL DATA:  Chest pain for 2 days EXAM: CHEST - 2 VIEW COMPARISON:  09/04/2017 FINDINGS: Heart is normal size and configuration. Left coronary artery stent stable from the prior study. Normal mediastinal and hilar contours. Lungs are clear.  No pleural effusion or pneumothorax. Skeletal structures are intact. IMPRESSION: No active cardiopulmonary disease. Electronically Signed   By: Amie Portland M.D.   On: 09/10/2018 13:33   Nm Myocar Multi W/spect W/wall Motion / Ef  Result Date: 09/11/2018 CLINICAL DATA:  Prior myocardial infarction. Abnormal EKG with first degree av block. Hyperlipidemia. Coronary artery disease. EXAM: MYOCARDIAL IMAGING  WITH SPECT (REST AND PHARMACOLOGIC-STRESS) GATED LEFT VENTRICULAR WALL MOTION STUDY LEFT VENTRICULAR EJECTION FRACTION TECHNIQUE: Standard myocardial SPECT imaging was performed after resting intravenous injection of 10 mCi Tc-68m tetrofosmin. Subsequently, intravenous infusion of Lexiscan was performed under the supervision of the Cardiology staff. At peak effect of the drug, 30 mCi Tc-66m  tetrofosmin was injected intravenously and standard myocardial SPECT imaging was performed. Quantitative gated imaging was also performed to evaluate left ventricular wall motion, and estimate left ventricular ejection fraction. COMPARISON:  08/07/2014 FINDINGS: Perfusion: Reduced activity in the inferior wall extending into the inferolateral base on stress and rest images, suspicious for moderate-sized scar. There is also some reduced activity at the cardiac apex compatible with apical thinning or scar. No inducible ischemia is identified. Wall Motion: Normal left ventricular wall motion. No left ventricular dilation. Left Ventricular Ejection Fraction: 58 % End diastolic volume 88 ml End systolic volume 37 ml IMPRESSION: 1. No inducible ischemia. Matched reduced activity in the inferior wall extending into the inferolateral base, compatible with moderate-sized scar. Possible scarring along the cardiac apex. 2. Normal left ventricular wall motion. 3. Left ventricular ejection fraction 58% 4. Non invasive risk stratification*: Low` *2012 Appropriate Use Criteria for Coronary Revascularization Focused Update: J Am Coll Cardiol. 2012;59(9):857-881. http://content.dementiazones.com.aspx?articleid=[email or web address] Electronically Signed   By: Gaylyn Rong M.D.   On: 09/11/2018 13:30   Results/Tests Pending at Time of Discharge: None  Discharge Medications:  Allergies as of 09/11/2018   No Known Allergies     Medication List    STOP taking these medications   donepezil 10 MG tablet Commonly known as:  ARICEPT     TAKE these medications   aspirin 81 MG chewable tablet Chew 1 tablet (81 mg total) by mouth daily.   isosorbide mononitrate 30 MG 24 hr tablet Commonly known as:  IMDUR Take 0.5 tablets (15 mg total) by mouth daily.   loratadine 10 MG tablet Commonly known as:  CLARITIN Take 10 mg by mouth daily.   memantine 10 MG tablet Commonly known as:  NAMENDA Take 1 tablet (10 mg total) by  mouth 2 (two) times daily.   nitroGLYCERIN 0.4 MG SL tablet Commonly known as:  NITROSTAT Place 1 tablet (0.4 mg total) under the tongue every 5 (five) minutes x 3 doses as needed for chest pain.   pantoprazole 40 MG tablet Commonly known as:  PROTONIX Take 1 tablet (40 mg total) by mouth daily.   pravastatin 40 MG tablet Commonly known as:  PRAVACHOL TAKE 1 TABLET(40 MG) BY MOUTH DAILY What changed:  See the new instructions.   tamsulosin 0.4 MG Caps capsule Commonly known as:  FLOMAX TAKE 1 CAPSULE(0.4 MG) BY MOUTH DAILY AFTER SUPPER What changed:  See the new instructions.   ticagrelor 90 MG Tabs tablet Commonly known as:  BRILINTA Take 1 tablet (90 mg total) by mouth 2 (two) times daily.       Discharge Instructions: Please refer to Patient Instructions section of EMR for full details.  Patient was counseled important signs and symptoms that should prompt return to medical care, changes in medications, dietary instructions, activity restrictions, and follow up appointments.   Follow-Up Appointments: Follow-up Information    Abelino Derrick, PA-C Follow up on 10/02/2018.   Specialties:  Cardiology, Radiology Why:  Please arrive 15 minutes early for your 9:30am appointment Contact information: [address] [place] [address] [phone]        Carney Living, MD. Go on 09/26/2018.  Specialty:  Family Medicine Why:  at 8:30am Contact information: [address] [place] [address] [phone]           Unknown Jim, DO 09/12/2018, 5:44 PM PGY-1, [facility]

## 2018-09-11 NOTE — Progress Notes (Signed)
PCP Social Visit  Appreciate great care of inpt team  He feels well but is very concerned about his son daughter interfering with his life  Difficult situation regarding changing abilities and maintaining independence.   He does not want to hear that they have his best interest in mind.  He can follow up with me as needed  Carney Living

## 2018-09-11 NOTE — Progress Notes (Signed)
OT Cancellation Note  Patient Details Name: Dale [name] MRN: [id number] DOB: [birth year]   Cancelled Treatment:    Reason Eval/Treat Not Completed: OT screened, no needs identified, will sign off.  Pt up ambulating in room, putting on clothing.  Denies deficits.  Jeani Hawking, OTR/L Acute Rehabilitation Services Pager [phone] Office [phone]   Jeani Hawking M 09/11/2018, 12:48 PM

## 2018-09-11 NOTE — Progress Notes (Signed)
PT Cancellation Note  Patient Details Name: Dale [name] MRN: [id number] DOB: 13-Mar-1941   Cancelled Treatment:    Reason Eval/Treat Not Completed: PT screened, no needs identified, will sign off. Pt independent with mobility without difficulty.   Angelina Ok [facility] 09/11/2018, 12:47 PM Skip Mayer PT Acute Rehabilitation Services Pager [phone] Office [phone]

## 2018-09-11 NOTE — Progress Notes (Signed)
Pt educated and provided discharge instructions. IV removed and intact. Pt has all belongings. Vitals stable. Pt denies any complaints. Pt tx via wheelchair to valet to meet family. Lacy Duverney

## 2018-09-21 ENCOUNTER — Other Ambulatory Visit: Payer: Self-pay | Admitting: Family Medicine

## 2018-09-21 ENCOUNTER — Other Ambulatory Visit: Payer: Self-pay | Admitting: Neurology

## 2018-09-21 DIAGNOSIS — N400 Enlarged prostate without lower urinary tract symptoms: Secondary | ICD-10-CM

## 2018-09-21 DIAGNOSIS — R350 Frequency of micturition: Secondary | ICD-10-CM

## 2018-09-24 ENCOUNTER — Telehealth: Payer: Self-pay | Admitting: Nurse Practitioner

## 2018-09-24 MED ORDER — MEMANTINE HCL 10 MG PO TABS: 10.0000 mg | ORAL_TABLET | Freq: Two times a day (BID) | ORAL | 3 refills | Status: DC

## 2018-09-24 NOTE — Progress Notes (Signed)
[facility]  PATIENT: Dale [name] DOB: 12/17/1940   REASON FOR VISIT: Follow-up for memory loss HISTORY FROM: Patient and daughter   HISTORY OF PRESENT ILLNESS:Dale [name] is a 89 right-handed male, accompanied by his daughter Dale [name], seen in refer by his primary care doctor Carney Living for evaluation of memory loss, initial evaluation was on December 12th 2017.  I reviewed and summarized the referring note, he had a history of hyperlipidemia, he graduate from 6th grade, he lives by himself since 2007, he still drives, does lawn care. His father had Alzheimer's disease at age 77s.   He was noted to have memory loss since 2015, loosing his keys, forget to turn his car key off, he made mistake paying bills, he denies gait abnormality, has good appetite, sleeps well.   I reviewed laboratory evaluations, normal folic acid B12, CBC, TSH, CMP  UPDATE March 06 2017:YY He is with his daughter at visit, he still lives by himself, able to does lawn care, he feels anxious sometime, difficulty with multitasking, his Mini-Mental Status Examination has decreased from previous 34 in December 2017 2 current 25 L he was admitted to hospital in January 2018 for now non-SETMI, echocardiogram showed ejection fraction 55-60%, wall motion was normal. Angiogram showed circumferential 100% stenosis, he had a stent placement. He is now on aspirin 81 mg and Brilinta 90 mg twice a day I reviewed laboratory evaluation in August 2017, vitamin B12 544, folic acid 14 point 7, TSH 9.02 I have personally reviewed MRI of the brain in January 2018, mild generalized atrophy, supratentorium small vessel disease. UPDATE 09/26/2018CM [name], 77 year old male returns for follow-up with history of memory loss. He continues to live alone, does his own cooking with family members checking in on him. He continues to drive and has not lost. He continues to mow his grass do his chores around  the house. He walks for exercise. He has not been doing any memory stimulating exercises. His father had Alzheimer's disease He returns for reevaluation UPDATE 4/16/2019CM [name], 77 year old male returns for follow-up with a history of memory loss.  He continues to live alone independent in activities of daily living does his own cooking.  Family checks on him.  He continues to drive has not gotten lost.  He continues to mow his grass , walks for exercise.  He remains on Namenda and Aricept without side effects.MRI of the brain in January 2018, mild generalized atrophy, supratentorium small vessel disease.  He returns for reevaluation UPDATE 10/16/2019CM [name], 77 year old male returns for follow-up with history of memory loss.  He is currently living with his daughter.  Hearing remains independent in activities of daily living.  He no longer drives he continues to walk for exercise.  He does not do any memory challenging exercises.  He had an admission to the hospital the end of September for chest pain.  He was taken off Aricept due to bradycardia.  He remains on Namenda 10 mg twice daily MMSE is stable.  No recent falls.  Appetite is good and he is sleeping well.  He returns for reevaluation  REVIEW OF SYSTEMS: Full 14 system review of systems performed and notable only for those listed, all others are neg:  Constitutional: neg  Cardiovascular: Bradycardia Ear/Nose/Throat: Allergies Skin: neg Eyes: neg Respiratory: neg Gastroitestinal: Urinary frequency Hematology/Lymphatic: neg  Endocrine: neg Musculoskeletal:neg Allergy/Immunology: neg Neurological: Memory loss Psychiatric: neg Sleep : neg   ALLERGIES: No Known Allergies  HOME MEDICATIONS: Outpatient Medications Prior to Visit  Medication Sig Dispense Refill  . aspirin 81 MG chewable tablet Chew 1 tablet (81 mg total) by mouth daily. 30 tablet 3  . isosorbide mononitrate (IMDUR) 30 MG 24 hr tablet Take 0.5 tablets (15 mg  total) by mouth daily. 30 tablet 0  . loratadine (CLARITIN) 10 MG tablet Take 10 mg by mouth daily.    . memantine (NAMENDA) 10 MG tablet Take 1 tablet (10 mg total) by mouth 2 (two) times daily. 180 tablet 3  . nitroGLYCERIN (NITROSTAT) 0.4 MG SL tablet Place 1 tablet (0.4 mg total) under the tongue every 5 (five) minutes x 3 doses as needed for chest pain. 25 tablet 12  . pantoprazole (PROTONIX) 40 MG tablet Take 1 tablet (40 mg total) by mouth daily. 90 tablet 1  . pravastatin (PRAVACHOL) 40 MG tablet Take 1 tablet (40 mg total) by mouth daily. 90 tablet 3  . tamsulosin (FLOMAX) 0.4 MG CAPS capsule TAKE 1 CAPSULE(0.4 MG) BY MOUTH DAILY AFTER SUPPER 90 capsule 0  . ticagrelor (BRILINTA) 90 MG TABS tablet Take 1 tablet (90 mg total) by mouth 2 (two) times daily. 180 tablet 0   No facility-administered medications prior to visit.     PAST MEDICAL HISTORY: Past Medical History:  Diagnosis Date  . Coronary artery disease   . Dementia (HCC)   . GERD (gastroesophageal reflux disease)   . Hyperlipemia   . Leg mass    rt  . Memory loss   . Myocardial infarction less than 4 weeks ago (HCC) 12/29/2016  . Prostate disease   . Quit smoking 11/20/2008   Qualifier: Diagnosis of  By: Deirdre Priest MD, Gaynell Face    . S/P angioplasty with stent to LCX     PAST SURGICAL HISTORY: Past Surgical History:  Procedure Laterality Date  . CARDIAC CATHETERIZATION N/A 12/30/2016   Procedure: Coronary Stent Intervention;  Surgeon: Runell Gess, MD;  Location: [facility] INVASIVE CV LAB;  Service: Cardiovascular;  Laterality: N/A;  . CARDIAC CATHETERIZATION N/A 12/30/2016   Procedure: Coronary Angiography;  Surgeon: Runell Gess, MD;  Location: [facility] INVASIVE CV LAB;  Service: Cardiovascular;  Laterality: N/A;  . CARDIAC CATHETERIZATION N/A 12/30/2016   Procedure: Coronary Balloon Angioplasty;  Surgeon: Runell Gess, MD;  Location: [facility] INVASIVE CV LAB;  Service: Cardiovascular;  Laterality: N/A;  . CORONARY STENT  PLACEMENT      FAMILY HISTORY: Family History  Problem Relation Age of Onset  . Cancer Mother        Posey Rea of type  . Heart attack Father   . Alzheimer's disease Father     SOCIAL HISTORY: Social History   Socioeconomic History  . Marital status: Widowed    Spouse name: Not on file  . Number of children: 2  . Years of education: 6th  . Highest education level: Not on file  Occupational History  . Occupation: Retired  Engineer, production  . Financial resource strain: Not on file  . Food insecurity:    Worry: Not on file    Inability: Not on file  . Transportation needs:    Medical: Not on file    Non-medical: Not on file  Tobacco Use  . Smoking status: Former Smoker    Packs/day: 1.00    Years: 20.00    Pack years: 20.00    Types: Cigarettes    Last attempt to quit: 10/23/2011    Years since quitting: 6.9  . Smokeless tobacco: Never Used  Substance and Sexual Activity  . Alcohol use: No    Comment: occ  . Drug use: No  . Sexual activity: Not Currently    Comment: Patient would like to be. Discussed wearing condom if becomes sexually active  Lifestyle  . Physical activity:    Days per week: Not on file    Minutes per session: Not on file  . Stress: Not on file  Relationships  . Social connections:    Talks on phone: Not on file    Gets together: Not on file    Attends religious service: Not on file    Active member of club or organization: Not on file    Attends meetings of clubs or organizations: Not on file    Relationship status: Not on file  . Intimate partner violence:    Fear of current or ex partner: Not on file    Emotionally abused: Not on file    Physically abused: Not on file    Forced sexual activity: Not on file  Other Topics Concern  . Not on file  Social History Narrative   Past Medical History:      EST approximately 2002         Family History:      Sister had CABG in her 69s       Social History:      He is retired frmm working for  city.  Does yard work with tractor and rides motorcycles. Has 2 children.  Sees his grandkids regularly.        Right-handed.   Occasional caffeine use.      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 Ducatte, RN, BSN                                                                                                   PHYSICAL EXAM  Vitals:   09/26/18 0925  BP: (!) 141/62  Pulse: (!) 51  Weight: 151 lb 6.4 oz (68.7 kg)  Height: 5\' 4"  (1.626 m)   Body mass index is 25.99 kg/m.  Generalized: Well developed, in no acute distress , well-groomed Head: normocephalic and atraumatic,. Oropharynx benign  Neck: Supple,  Cardiac: Regular rate rhythm, no murmur  Musculoskeletal: No deformity   Neurological examination   Mentation: Alert  MMSE - Mini Mental State Exam 09/26/2018 03/27/2018 09/06/2017  Not completed: (No Data) - -  Orientation to time 2 4 3   Orientation to Place 3 3 4   Registration 3 3 3   Attention/ Calculation 2 0 1  Recall 2 1 1  Language- name 2 objects 2 2 2   Language- repeat 1 1 0  Language- follow 3 step command 3 3 3   Language- read & follow direction 0 1 1  Write a sentence 0 1 1  Copy design 0 0 0  Total score 18 19 19   . Clock drawing 4/4  Follows all commands speech and language fluent.   Cranial nerve II-XII: Pupils were equal round reactive to light extraocular movements were full, visual field were full on confrontational test. Facial sensation and strength were normal. hearing was intact to finger  rubbing bilaterally. Uvula tongue midline. head turning and shoulder shrug were normal and symmetric.Tongue protrusion into cheek strength was normal. Motor: normal bulk and tone, full strength in the BUE, BLE, fine finger movements normal, no pronator drift. No focal weakness Sensory: normal and symmetric to light touch, in the upper and lower extremities Coordination: finger-nose-finger, heel-to-shin bilaterally, no dysmetria Reflexes: Symmetric upper and lower, plantar responses were flexor bilaterally. Gait and Station: Rising up from seated position without assistance, normal stance,  moderate stride, good arm swing, smooth turning, able to perform tiptoe, and heel walking without difficulty. Tandem gait is steady. No assistive device  DIAGNOSTIC DATA (LABS, IMAGING, TESTING) - I reviewed patient records, labs, notes, testing and imaging myself where available.  Lab Results  Component Value Date   WBC 6.1 09/10/2018   HGB 11.8 (L) 09/10/2018   HCT 39.9 09/10/2018   MCV 90.3 09/10/2018   PLT 201 09/10/2018      Component Value Date/Time   NA 140 09/10/2018 1220   NA 147 (H) 10/25/2017 0956   K 4.0 09/10/2018 1220   CL 100 09/10/2018 1220   CO2 28 09/10/2018 1220   GLUCOSE 95 09/10/2018 1220   BUN 13 09/10/2018 1220   BUN 16 10/25/2017 0956   CREATININE 1.09 09/10/2018 2139   CREATININE 1.10 01/09/2017 1611   CALCIUM 9.2 09/10/2018 1220   PROT 6.7 10/25/2017 0956   ALBUMIN 4.0 10/25/2017 0956   AST 20 10/25/2017 0956   ALT 27 10/25/2017 0956   ALKPHOS 137 (H) 10/25/2017 0956   BILITOT 0.5 10/25/2017 0956   GFRNONAA >60 09/10/2018 2139   GFRAA >60 09/10/2018 2139   Lab Results  Component Value Date   CHOL 153 09/11/2018   HDL 65 09/11/2018   LDLCALC 70 09/11/2018   TRIG 91 09/11/2018   CHOLHDL 2.4 09/11/2018     ASSESSMENT AND PLAN SINAN TUCH is a 77 y.o. male  With diagnosis of mild cognitive impairment with progressive worsening. Mini-Mental Status exam today  18/30. Previous 19/30. There is a family history of Alzheimer's dementia MRI of the brain, showed generalized atrophy.  Recent hospitalization for chest pain and bradycardia.  Patient was taken off Aricept.  He remains on Namenda.MMSE 18/30 last 19/30.   PLAN:  Aricept stopped due to bradycardia Continue Namenda at current dose   Perform cognitively stimulating exercises to stimulate memory No multitasking Follow-up in 6 months Walk daily for exercise this will also help your memory I spent 25 minutes in total face to face time with the patient/friend  more than 50% of which was spent counseling and coordination of care, reviewing test results reviewing hospital records reviewing medications and discussing and reviewing the diagnosis of progressive Alzheimer's dementia, memory stimulating exercises, importance of safety measures.  Patient should not be driving  Nilda Riggs, [facility], [facility], APRN  [facility] [address], [address] [place], [place] [address] [phone]

## 2018-09-24 NOTE — Addendum Note (Signed)
Addended by: Manuela Schwartz on: 09/24/2018 01:13 PM   Modules accepted: Orders

## 2018-09-24 NOTE — Telephone Encounter (Signed)
Patient's daughter [name] (on [place]) requesting refill for memantine (NAMENDA) 10 MG tablet sent to [facility] on [address]

## 2018-09-26 ENCOUNTER — Ambulatory Visit: Payer: Medicare HMO | Admitting: Nurse Practitioner

## 2018-09-26 ENCOUNTER — Other Ambulatory Visit: Payer: Self-pay

## 2018-09-26 ENCOUNTER — Encounter: Payer: Self-pay | Admitting: Nurse Practitioner

## 2018-09-26 ENCOUNTER — Encounter: Payer: Self-pay | Admitting: Family Medicine

## 2018-09-26 ENCOUNTER — Ambulatory Visit (INDEPENDENT_AMBULATORY_CARE_PROVIDER_SITE_OTHER): Payer: Medicare HMO | Admitting: Family Medicine

## 2018-09-26 VITALS — BP 141/62 | HR 51 | Ht 64.0 in | Wt 151.4 lb

## 2018-09-26 DIAGNOSIS — R4189 Other symptoms and signs involving cognitive functions and awareness: Secondary | ICD-10-CM

## 2018-09-26 DIAGNOSIS — R413 Other amnesia: Secondary | ICD-10-CM

## 2018-09-26 DIAGNOSIS — R001 Bradycardia, unspecified: Secondary | ICD-10-CM | POA: Diagnosis not present

## 2018-09-26 DIAGNOSIS — R0789 Other chest pain: Secondary | ICD-10-CM

## 2018-09-26 NOTE — Assessment & Plan Note (Signed)
Seems stable off Aricept.  Defer to neurology who is managing his medications

## 2018-09-26 NOTE — Patient Instructions (Signed)
Aricept stopped due to bradycardia Continue Namenda at current dose   Perform cognitively stimulating exercises to stimulate memory No multitasking Follow-up in 6 months Walk daily for exercise this will also help your memory

## 2018-09-26 NOTE — Assessment & Plan Note (Signed)
History could be consistent with angina but not unstable.  Continue his current medications.  Given his significant bradycardia would not  Add BB or CCB.  He will follow up with cardiology and knows when to go to ER.

## 2018-09-26 NOTE — Progress Notes (Signed)
Subjective  Dale [name] is a 77 y.o. male is presenting with the following  CHEST PAIN Hospital follow up for recent admission.  Chest pain thought to be anxiety related Issues for Follow Up:  1. Consider re-assessment for need for DAPT. 2. Aricept discontinued in the setting of bradycardia. 3. Will need follow up with cardiology. 4. Follow up and continued management of bradycardia. 5. Will need follow up of possible anxiety and his home situation.  He feels he is having chest pain at times when he exercises or is anxious.  It lasts for a few minutes and then resolves.  Is not progressive and does not interfere with his activities  BRADYCARDIA Has mild lightheadness if stands up quickly.  Thinks he has stopped his Aricept but is not sure of all his medications.  No syncope or near falls.  Has appointment with cardiology next week  DEMENTIA Thinks he is only taking namenda now. Has appointment with neurology this afternoon   Chief Complaint noted Review of Symptoms - see HPI PMH - Smoking status noted.    Objective Vital Signs reviewed BP 132/80   Pulse (!) 47   Temp 97.9 F (36.6 C) (Oral)   Wt 150 lb 3.2 oz (68.1 kg)   SpO2 99%   BMI 25.78 kg/m  Heart - brady RR not mgr Lungs:  Normal respiratory effort, chest expands symmetrically. Lungs are clear to auscultation, no crackles or wheezes. Extremities:  No cyanosis, edema, or deformity noted with good range of motion of all major joints.   Knows some of his medications, Not exactly sure of date he was in the hospital  Assessments/Plans  See after visit summary for details of patient instuctions  Bradycardia Persistent despite stopping Aricept (this was confirmed by his daughter)  Had work up in hospital.  Relatively asymptomatic - he will follow up with cardiology.    Chest discomfort History could be consistent with angina but not unstable.  Continue his current medications.  Given his significant bradycardia would  not  Add BB or CCB.  He will follow up with cardiology and knows when to go to ER.    Memory loss Seems stable off Aricept.  Defer to neurology who is managing his medications

## 2018-09-26 NOTE — Assessment & Plan Note (Signed)
Persistent despite stopping Aricept (this was confirmed by his daughter)  Had work up in hospital.  Relatively asymptomatic - he will follow up with cardiology.

## 2018-09-26 NOTE — Patient Instructions (Signed)
Good to see you today!  Thanks for coming in.  Please review to make sure you are only taking the medications on this list. Particularly that you stopped the Aricept due to your low heart rate.  Let your neurologist know this   If you develop chest pain or shortness of breath  that does not go away when you rest you should go to the ER  Your heart doctor will discuss your low heart rate.  If you start to feel really lightheaded or dizzy you need to go to the ER  Come back in 3 months

## 2018-09-27 ENCOUNTER — Other Ambulatory Visit: Payer: Self-pay | Admitting: Family Medicine

## 2018-10-02 ENCOUNTER — Ambulatory Visit: Payer: Medicare HMO | Admitting: Cardiology

## 2018-10-02 NOTE — Progress Notes (Signed)
I have reviewed and agreed above plan. 

## 2018-10-04 ENCOUNTER — Ambulatory Visit: Payer: Medicare HMO | Admitting: Cardiology

## 2018-10-18 ENCOUNTER — Encounter: Payer: Self-pay | Admitting: Cardiology

## 2018-10-18 ENCOUNTER — Ambulatory Visit: Payer: Medicare HMO | Admitting: Cardiology

## 2018-10-18 VITALS — BP 117/65 | HR 48 | Resp 16 | Ht 65.5 in | Wt 145.6 lb

## 2018-10-18 DIAGNOSIS — Z9861 Coronary angioplasty status: Secondary | ICD-10-CM | POA: Diagnosis not present

## 2018-10-18 DIAGNOSIS — R0789 Other chest pain: Secondary | ICD-10-CM | POA: Diagnosis not present

## 2018-10-18 DIAGNOSIS — R001 Bradycardia, unspecified: Secondary | ICD-10-CM | POA: Diagnosis not present

## 2018-10-18 DIAGNOSIS — E785 Hyperlipidemia, unspecified: Secondary | ICD-10-CM

## 2018-10-18 DIAGNOSIS — R413 Other amnesia: Secondary | ICD-10-CM

## 2018-10-18 DIAGNOSIS — I2510 Atherosclerotic heart disease of native coronary artery without angina pectoris: Secondary | ICD-10-CM | POA: Diagnosis not present

## 2018-10-18 MED ORDER — TICAGRELOR 90 MG PO TABS: 90.0000 mg | ORAL_TABLET | Freq: Two times a day (BID) | ORAL | 0 refills | Status: DC

## 2018-10-18 MED ORDER — ISOSORBIDE MONONITRATE ER 30 MG PO TB24: 15.0000 mg | ORAL_TABLET | Freq: Every day | ORAL | 3 refills | Status: DC

## 2018-10-18 NOTE — Progress Notes (Signed)
10/18/2018 [name]   07/08/1941  [id number]  Primary Physician Dale Living, MD Primary Cardiologist: Dr Dale [name]  HPI:  77 y.o. male with a hx of CAD who was seen in the hospital 09/10/18 for the evaluation of chest pain  .  Patient had complex intervention to totally occluded OM1 in January of 2018 with stenting of inferior branch of large OM1. He has a history of memory deficit and is followed by neurology. Myoview done 09/11/18 was low risk with scar but no ischemia.  Low dose Imdur was added.  His Aricept was stopped during this admission secondary to bradycardia. He is in the office today for follow up.  He says he came alone but actually his daughter was with him.  He denies chest pain and has not had syncope or near syncope.    Current Outpatient Medications  Medication Sig Dispense Refill  . aspirin 81 MG chewable tablet Chew 1 tablet (81 mg total) by mouth daily. 30 tablet 3  . isosorbide mononitrate (IMDUR) 30 MG 24 hr tablet Take 0.5 tablets (15 mg total) by mouth daily. 90 tablet 3  . loratadine (CLARITIN) 10 MG tablet Take 10 mg by mouth daily.    . memantine (NAMENDA) 10 MG tablet Take 1 tablet (10 mg total) by mouth 2 (two) times daily. 180 tablet 3  . nitroGLYCERIN (NITROSTAT) 0.4 MG SL tablet Place 1 tablet (0.4 mg total) under the tongue every 5 (five) minutes x 3 doses as needed for chest pain. 25 tablet 12  . pantoprazole (PROTONIX) 40 MG tablet Take 1 tablet (40 mg total) by mouth daily. 90 tablet 1  . pravastatin (PRAVACHOL) 40 MG tablet Take 1 tablet (40 mg total) by mouth daily. 90 tablet 3  . tamsulosin (FLOMAX) 0.4 MG CAPS capsule TAKE 1 CAPSULE(0.4 MG) BY MOUTH DAILY AFTER SUPPER 90 capsule 0  . ticagrelor (BRILINTA) 90 MG TABS tablet Take 1 tablet (90 mg total) by mouth 2 (two) times daily. 60 tablet 0   No current facility-administered medications for this visit.     No Known Allergies  Past Medical History:  Diagnosis Date  . Coronary artery  disease   . Dementia (HCC)   . GERD (gastroesophageal reflux disease)   . Hyperlipemia   . Leg mass    rt  . Memory loss   . Myocardial infarction less than 4 weeks ago (HCC) 12/29/2016  . Prostate disease   . Quit smoking 11/20/2008   Qualifier: Diagnosis of  By: Dale Priest MD, Dale [name]    . S/P angioplasty with stent to LCX     Social History   Socioeconomic History  . Marital status: Widowed    Spouse name: Not on file  . Number of children: 2  . Years of education: 6th  . Highest education level: Not on file  Occupational History  . Occupation: Retired  Engineer, production  . Financial resource strain: Not on file  . Food insecurity:    Worry: Not on file    Inability: Not on file  . Transportation needs:    Medical: Not on file    Non-medical: Not on file  Tobacco Use  . Smoking status: Former Smoker    Packs/day: 1.00    Years: 20.00    Pack years: 20.00    Types: Cigarettes    Last attempt to quit: 10/23/2011    Years since quitting: 6.9  . Smokeless tobacco: Never Used  Substance and Sexual Activity  .  Alcohol use: No    Comment: occ  . Drug use: No  . Sexual activity: Not Currently    Comment: Patient would like to be. Discussed wearing condom if becomes sexually active  Lifestyle  . Physical activity:    Days per week: Not on file    Minutes per session: Not on file  . Stress: Not on file  Relationships  . Social connections:    Talks on phone: Not on file    Gets together: Not on file    Attends religious service: Not on file    Active member of club or organization: Not on file    Attends meetings of clubs or organizations: Not on file    Relationship status: Not on file  . Intimate partner violence:    Fear of current or ex partner: Not on file    Emotionally abused: Not on file    Physically abused: Not on file    Forced sexual activity: Not on file  Other Topics Concern  . 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 Has 2 children.  Sees his grandkids regularly.        Right-handed.   Occasional caffeine use.      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 Ducatte, RN, BSN                                                                                                   Family History  Problem Relation Age of Onset  . Cancer Mother        Dale [name] of type  . Heart attack Father   . Alzheimer's disease Father      Review of Systems: General: negative for chills, fever, night sweats or weight changes.  Cardiovascular: negative for chest pain, dyspnea on exertion, edema, orthopnea, palpitations, paroxysmal nocturnal dyspnea or shortness of breath Dermatological: negative for rash Respiratory: negative for cough or wheezing Urologic: negative for hematuria Abdominal: negative for nausea, vomiting, diarrhea, bright red blood per rectum, melena, or hematemesis Neurologic: negative for visual changes, syncope, or dizziness All other systems reviewed and are otherwise negative except as noted above.    Blood pressure 117/65, pulse (!) 48, resp. rate 16,  height 5' 5.5" (1.664 m), weight 145 lb 9.6 oz (66 kg).  General appearance: alert, cooperative and no distress Neck: no carotid bruit and no JVD Lungs: clear to auscultation bilaterally Heart:  regular rate and rhythm Extremities: extremities normal, atraumatic, no cyanosis or edema Skin: warm and dry Neurologic: Grossly normal  EKG NSR- SB 47  ASSESSMENT AND PLAN:   Chest discomfort Admitted for chest pain -MI r/o 9/31/19-  felt to be secondary to stress at home  CAD S/P percutaneous coronary angioplasty STEMI- CFX PCI Jan 2018 Myoview 09/11/18 for chest pain- low risk, EF 58%  Bradycardia Asymptomatic. Aricept stopped 09/11/18  Dyslipidemia LDL 70 09/11/18  Memory loss Suspect he has early dementia    PLAN  Continue Imdur 15 mg.  I'll ask Dr Dale [name] if we can change his Brilinta to Plavix as his DES was > a year ago. The family will watch for any symptoms from his bradycardia.  F/U Dr Dale [name] in 2-3 months.   Corine Shelter PA-C 10/18/2018 9:55 AM

## 2018-10-18 NOTE — Assessment & Plan Note (Signed)
LDL 70 09/11/18

## 2018-10-18 NOTE — Patient Instructions (Signed)
Medication Instructions:  No Changes. If you need a refill on your cardiac medications before your next appointment, please call your pharmacy.   Lab work: None Ordered. If you have labs (blood work) drawn today and your tests are completely normal, you will receive your results only by: Marland Kitchen MyChart Message (if you have MyChart) OR . A paper copy in the mail If you have any lab test that is abnormal or we need to change your treatment, we will call you to review the results.  Testing/Procedures: None Ordered.  Follow-Up: At [facility], you and your health needs are our priority.  As part of our continuing mission to provide you with exceptional heart care, we have created designated Provider Care Teams.  These Care Teams include your primary Cardiologist (physician) and Advanced Practice Providers (APPs -  Physician Assistants and Nurse Practitioners) who all work together to provide you with the care you need, when you need it. You will need a follow up appointment in 4 months.  Please call our office 1 month in advance to schedule this appointment.  You may see Dr.Berry or one of the following Advanced Practice Providers on your designated Care Team:   Corine Shelter, PA-C Judy Pimple, [place] . Marjie Skiff, PA-C  Any Other Special Instructions Will Be Listed Below (If Applicable). None

## 2018-10-18 NOTE — Assessment & Plan Note (Signed)
Suspect he has early dementia

## 2018-10-18 NOTE — Assessment & Plan Note (Signed)
Admitted for chest pain -MI r/o 9/31/19-  felt to be secondary to stress at home

## 2018-10-18 NOTE — Assessment & Plan Note (Signed)
STEMI- CFX PCI Jan 2018 Myoview 09/11/18 for chest pain- low risk, EF 58%

## 2018-10-18 NOTE — Assessment & Plan Note (Signed)
Asymptomatic. Aricept stopped 09/11/18

## 2018-10-26 ENCOUNTER — Telehealth: Payer: Self-pay

## 2018-10-26 MED ORDER — CLOPIDOGREL BISULFATE 75 MG PO TABS: 75.0000 mg | ORAL_TABLET | Freq: Every day | ORAL | 8 refills | Status: DC

## 2018-10-26 NOTE — Telephone Encounter (Signed)
Left detail message with recommendations, ok per DPR, and to call back if any questions. Brilinta discontinued and new rx for plavix sent to the pharmacy.

## 2018-10-26 NOTE — Telephone Encounter (Signed)
-----   Message from Abelino Derrick, [place] sent at 10/22/2018  5:44 PM EST ----- T can you let Mr Catchings know we can change him to Plavix- no loading dose, 75 mg daily.  Corine Shelter PA-C 10/22/2018 5:45 PM ----- Message ----- From: Chrystie Nose, MD Sent: 10/18/2018   8:43 PM EST To: Abelino Derrick, PA-C  That should be fine - no need to load him on plavix since stent was >1 year ago.  -[place] (covering for [employer])  ----- Message ----- From: Lorin Mercy Sent: 10/18/2018   9:57 AM EST To: Runell Gess, MD  Can we change Brilinta to Plavix-?

## 2018-11-05 ENCOUNTER — Other Ambulatory Visit: Payer: Self-pay | Admitting: Family Medicine

## 2018-11-09 ENCOUNTER — Other Ambulatory Visit: Payer: Self-pay | Admitting: Family Medicine

## 2018-12-07 ENCOUNTER — Other Ambulatory Visit: Payer: Self-pay | Admitting: Cardiology

## 2018-12-13 ENCOUNTER — Other Ambulatory Visit: Payer: Self-pay | Admitting: Cardiology

## 2018-12-13 ENCOUNTER — Other Ambulatory Visit: Payer: Self-pay | Admitting: Family Medicine

## 2018-12-13 NOTE — Telephone Encounter (Signed)
Patient is having trouble getting the Brilinta refilled, is now out of meds and is not sure how to fix the problem.  Please call patient at [phone].

## 2018-12-14 ENCOUNTER — Other Ambulatory Visit: Payer: Self-pay | Admitting: *Deleted

## 2018-12-14 MED ORDER — CLOPIDOGREL BISULFATE 75 MG PO TABS: 75.0000 mg | ORAL_TABLET | Freq: Every day | ORAL | 8 refills | Status: DC

## 2018-12-14 NOTE — Telephone Encounter (Signed)
Talked to pt's daughter Noralee Chars and she has resolved the issue.  She spoke to patient's cardiologist and all has been taken care of.  She thanks [place] for the call.  Glennie Hawk, CMA

## 2018-12-14 NOTE — Telephone Encounter (Signed)
Please call patient and let him know this is a medication given to him by his cardiologist. He should contact them to see if he should still be taking it  Thanks  LC

## 2018-12-23 ENCOUNTER — Other Ambulatory Visit: Payer: Self-pay | Admitting: Neurology

## 2018-12-23 DIAGNOSIS — N400 Enlarged prostate without lower urinary tract symptoms: Secondary | ICD-10-CM

## 2018-12-23 DIAGNOSIS — R350 Frequency of micturition: Secondary | ICD-10-CM

## 2019-01-02 ENCOUNTER — Other Ambulatory Visit: Payer: Self-pay | Admitting: Family Medicine

## 2019-01-08 ENCOUNTER — Other Ambulatory Visit: Payer: Self-pay | Admitting: Neurology

## 2019-01-08 DIAGNOSIS — N400 Enlarged prostate without lower urinary tract symptoms: Secondary | ICD-10-CM

## 2019-01-08 DIAGNOSIS — R350 Frequency of micturition: Secondary | ICD-10-CM

## 2019-01-09 ENCOUNTER — Other Ambulatory Visit: Payer: Self-pay | Admitting: Family Medicine

## 2019-01-09 DIAGNOSIS — N400 Enlarged prostate without lower urinary tract symptoms: Secondary | ICD-10-CM

## 2019-01-09 DIAGNOSIS — R350 Frequency of micturition: Secondary | ICD-10-CM

## 2019-03-06 ENCOUNTER — Telehealth: Payer: Self-pay | Admitting: Family Medicine

## 2019-03-06 NOTE — Telephone Encounter (Signed)
Spoke with daughter and then him on cell Doing well no problems  Asked him to call if needed and stay away from people

## 2019-04-01 ENCOUNTER — Other Ambulatory Visit: Payer: Self-pay

## 2019-04-01 ENCOUNTER — Ambulatory Visit (INDEPENDENT_AMBULATORY_CARE_PROVIDER_SITE_OTHER): Payer: Medicare HMO | Admitting: Neurology

## 2019-04-01 DIAGNOSIS — R413 Other amnesia: Secondary | ICD-10-CM | POA: Diagnosis not present

## 2019-04-01 MED ORDER — MEMANTINE HCL 10 MG PO TABS: 10.0000 mg | ORAL_TABLET | Freq: Two times a day (BID) | ORAL | 4 refills | Status: DC

## 2019-04-01 NOTE — Progress Notes (Signed)
[facility]  PATIENT: Dale [name] DOB: [birth year]  HISTORY OF PRESENT ILLNESS:Dale [name] is a 61 right-handed male, accompanied by his daughter Dale [name], seen in refer by his primary care doctor Carney Living for evaluation of memory loss, initial evaluation was on December 12th 2017.  I reviewed and summarized the referring note, he had a history of hyperlipidemia, he graduate from 6th grade, he lives by himself since 2007, he still drives, does lawn care. His father had Alzheimer's disease at age 55s.   He was noted to have memory loss since 2015, loosing his keys, forget to turn his car key off, he made mistake paying bills, he denies gait abnormality, has good appetite, sleeps well.   I reviewed laboratory evaluations, normal folic acid B12, CBC, TSH, CMP  UPDATE March 06 2017:YY He is with his daughter at visit, he still lives by himself, able to does lawn care, he feels anxious sometime, difficulty with multitasking, his Mini-Mental Status Examination has decreased from previous 74 in December 2017 2 current 25 L he was admitted to hospital in January 2018 for now non-SETMI, echocardiogram showed ejection fraction 55-60%, wall motion was normal. Angiogram showed circumferential 100% stenosis, he had a stent placement. He is now on aspirin 81 mg and Brilinta 90 mg twice a day I reviewed laboratory evaluation in August 2017, vitamin B12 544, folic acid 14 point 7, TSH 4.15 I have personally reviewed MRI of the brain in January 2018, mild generalized atrophy, supratentorium small vessel disease. UPDATE 09/26/2018CM Dale [name], 78 year old male returns for follow-up with history of memory loss. He continues to live alone, does his own cooking with family members checking in on him. He continues to drive and has not lost. He continues to mow his grass do his chores around the house. He walks for exercise. He has not been doing any memory stimulating  exercises. His father had Alzheimer's disease He returns for reevaluation UPDATE 4/16/2019CM Dale [name], 78 year old male returns for follow-up with a history of memory loss.  He continues to live alone independent in activities of daily living does his own cooking.  Family checks on him.  He continues to drive has not gotten lost.  He continues to mow his grass , walks for exercise.  He remains on Namenda and Aricept without side effects.MRI of the brain in January 2018, mild generalized atrophy, supratentorium small vessel disease.  He returns for reevaluation  UPDATE 10/16/2019CM Dale [name], 78 year old male returns for follow-up with history of memory loss.  He is currently living with his daughter.  Hearing remains independent in activities of daily living.  He no longer drives he continues to walk for exercise.  He does not do any memory challenging exercises.  He had an admission to the hospital the end of September for chest pain.  He was taken off Aricept due to bradycardia.  He remains on Namenda 10 mg twice daily MMSE is stable.  No recent falls.  Appetite is good and he is sleeping well.  He returns for reevaluation  Virtual Visit via Video  I connected with Dale [name] on 04/01/19 at  by video and verified that I am speaking with the correct person using two identifiers.   I discussed the limitations, risks, security and privacy concerns of performing an evaluation and management service by video and the availability of in person appointments. I also discussed with the patient that there may be a patient responsible charge related to this  service. The patient expressed understanding and agreed to proceed.  History of Present Illness: He is with his daughter at today's interview, overall he is fairly stable, taking Namenda 10 mg twice a day, there is no significant agitations   Observations/Objective: I have reviewed problem lists, medications, allergies.  MMSE - Mini Mental State Exam  09/26/2018 03/27/2018 09/06/2017  Not completed: (No Data) - -  Orientation to time 2 4 3   Orientation to Place 3 3 4   Registration 3 3 3   Attention/ Calculation 2 0 1  Recall 2 1 1   Language- name 2 objects 2 2 2   Language- repeat 1 1 0  Language- follow 3 step command 3 3 3   Language- read & follow direction 0 1 1  Write a sentence 0 1 1  Copy design 0 0 0  Total score 18 19 19     Assessment and Plan: Dementia  Overall stable over the past few months,  Continue Namenda 10 mg twice a day  Return to clinic as needed  Follow Up Instructions:  As needed.    I discussed the assessment and treatment plan with the patient. The patient was provided an opportunity to ask questions and all were answered. The patient agreed with the plan and demonstrated an understanding of the instructions.   The patient was advised to call back or seek an in-person evaluation if the symptoms worsen or if the condition fails to improve as anticipated.  I provided 15 minutes of non-face-to-face time during this encounter.   Levert Feinstein, MD

## 2019-04-02 ENCOUNTER — Encounter: Payer: Self-pay | Admitting: Neurology

## 2019-04-05 ENCOUNTER — Other Ambulatory Visit: Payer: Self-pay | Admitting: Family Medicine

## 2019-07-04 ENCOUNTER — Other Ambulatory Visit: Payer: Self-pay | Admitting: Family Medicine

## 2019-07-04 DIAGNOSIS — R350 Frequency of micturition: Secondary | ICD-10-CM

## 2019-07-04 DIAGNOSIS — N400 Enlarged prostate without lower urinary tract symptoms: Secondary | ICD-10-CM

## 2019-07-05 ENCOUNTER — Telehealth: Payer: Self-pay | Admitting: *Deleted

## 2019-07-05 NOTE — Telephone Encounter (Signed)
Spoke with pt daughter and scheduled him an appt. Deseree Blount, CMA  

## 2019-07-05 NOTE — Telephone Encounter (Signed)
-----   Message from Lind Covert, MD sent at 07/04/2019  1:57 PM EDT ----- Regarding: Appt Please contact him and ask him to make an appointment for a virtual visit   Thanks

## 2019-07-17 ENCOUNTER — Telehealth (INDEPENDENT_AMBULATORY_CARE_PROVIDER_SITE_OTHER): Payer: Medicare HMO | Admitting: Family Medicine

## 2019-07-17 ENCOUNTER — Other Ambulatory Visit: Payer: Self-pay

## 2019-07-17 DIAGNOSIS — E785 Hyperlipidemia, unspecified: Secondary | ICD-10-CM | POA: Diagnosis not present

## 2019-07-17 DIAGNOSIS — R413 Other amnesia: Secondary | ICD-10-CM

## 2019-07-17 DIAGNOSIS — Z9861 Coronary angioplasty status: Secondary | ICD-10-CM | POA: Diagnosis not present

## 2019-07-17 DIAGNOSIS — I2510 Atherosclerotic heart disease of native coronary artery without angina pectoris: Secondary | ICD-10-CM | POA: Diagnosis not present

## 2019-07-17 NOTE — Assessment & Plan Note (Signed)
Asymptomatic. 

## 2019-07-17 NOTE — Progress Notes (Signed)
[place] Telemedicine Visit  Patient consented to have virtual visit. Method of visit: Telephone  Encounter participants: Patient: Dale [name] - located at home Provider: Lind Covert - located at office Others (if applicable): Daughter Ronny Bacon - spoke with first on her phone then with patient on his   Chief Complaint: Feels well  HPI: LIPIDS - no chest pain or shortness of breath with exertion.  Rides his bike regularly.  No abdomen pain Taking pravastatin   CAD - no chest pain.  Exercises without symptoms.  Taking Imudur.  Has not had to use NTG  DEMENTIA Knows me and his dtrs names.  No remembrance of getting lost.  Taking namenda regularly    ROS: per HPI  Pertinent PMHx: see above  Exam:  Respiratory: normal pattern Psych:  Cognition and judgment appear intact. Alert, communicative  and cooperative with normal attention span and concentration. No apparent delusions, illusions, hallucinations   Assessment/Plan:  CAD S/P percutaneous coronary angioplasty Asymptomatic.    Dyslipidemia Stable.  Tolerating statin well   Memory loss Stable on namenda.      Time spent during visit with patient: 10 minutes

## 2019-07-17 NOTE — Assessment & Plan Note (Signed)
Stable.  Tolerating statin well

## 2019-07-17 NOTE — Assessment & Plan Note (Signed)
Stable on namenda.  °

## 2019-08-22 DIAGNOSIS — Z0100 Encounter for examination of eyes and vision without abnormal findings: Secondary | ICD-10-CM | POA: Diagnosis not present

## 2019-09-14 ENCOUNTER — Other Ambulatory Visit: Payer: Self-pay

## 2019-09-14 ENCOUNTER — Encounter (HOSPITAL_BASED_OUTPATIENT_CLINIC_OR_DEPARTMENT_OTHER): Payer: Self-pay | Admitting: Emergency Medicine

## 2019-09-14 ENCOUNTER — Emergency Department (HOSPITAL_BASED_OUTPATIENT_CLINIC_OR_DEPARTMENT_OTHER)
Admission: EM | Admit: 2019-09-14 | Discharge: 2019-09-14 | Disposition: A | Discharge status: Home / Self Care | Payer: Medicare HMO | Attending: Emergency Medicine | Admitting: Emergency Medicine

## 2019-09-14 DIAGNOSIS — Z955 Presence of coronary angioplasty implant and graft: Secondary | ICD-10-CM | POA: Diagnosis not present

## 2019-09-14 DIAGNOSIS — Z87891 Personal history of nicotine dependence: Secondary | ICD-10-CM | POA: Diagnosis not present

## 2019-09-14 DIAGNOSIS — I2510 Atherosclerotic heart disease of native coronary artery without angina pectoris: Secondary | ICD-10-CM | POA: Insufficient documentation

## 2019-09-14 DIAGNOSIS — I252 Old myocardial infarction: Secondary | ICD-10-CM | POA: Diagnosis not present

## 2019-09-14 DIAGNOSIS — F0390 Unspecified dementia without behavioral disturbance: Secondary | ICD-10-CM | POA: Insufficient documentation

## 2019-09-14 DIAGNOSIS — Z7982 Long term (current) use of aspirin: Secondary | ICD-10-CM | POA: Diagnosis not present

## 2019-09-14 DIAGNOSIS — R42 Dizziness and giddiness: Secondary | ICD-10-CM | POA: Diagnosis not present

## 2019-09-14 DIAGNOSIS — B029 Zoster without complications: Secondary | ICD-10-CM | POA: Diagnosis not present

## 2019-09-14 DIAGNOSIS — Z79899 Other long term (current) drug therapy: Secondary | ICD-10-CM | POA: Insufficient documentation

## 2019-09-14 LAB — CBC WITH DIFFERENTIAL/PLATELET
Abs Immature Granulocytes: 0.02 10*3/uL (ref 0.00–0.07)
Basophils Absolute: 0 10*3/uL (ref 0.0–0.1)
Basophils Relative: 0 %
Eosinophils Absolute: 0.1 10*3/uL (ref 0.0–0.5)
Eosinophils Relative: 2 %
HCT: 44.4 % (ref 39.0–52.0)
Hemoglobin: 13.2 g/dL (ref 13.0–17.0)
Immature Granulocytes: 0 %
Lymphocytes Relative: 18 %
Lymphs Abs: 1 10*3/uL (ref 0.7–4.0)
MCH: 26.7 pg (ref 26.0–34.0)
MCHC: 29.7 g/dL — ABNORMAL LOW (ref 30.0–36.0)
MCV: 89.9 fL (ref 80.0–100.0)
Monocytes Absolute: 0.7 10*3/uL (ref 0.1–1.0)
Monocytes Relative: 12 %
Neutro Abs: 3.9 10*3/uL (ref 1.7–7.7)
Neutrophils Relative %: 68 %
Platelets: 198 10*3/uL (ref 150–400)
RBC: 4.94 MIL/uL (ref 4.22–5.81)
RDW: 12.2 % (ref 11.5–15.5)
WBC: 5.7 10*3/uL (ref 4.0–10.5)
nRBC: 0 % (ref 0.0–0.2)

## 2019-09-14 LAB — CBG MONITORING, ED: Glucose-Capillary: 102 mg/dL — ABNORMAL HIGH (ref 70–99)

## 2019-09-14 LAB — BASIC METABOLIC PANEL
Anion gap: 10 (ref 5–15)
BUN: 14 mg/dL (ref 8–23)
CO2: 30 mmol/L (ref 22–32)
Calcium: 9.3 mg/dL (ref 8.9–10.3)
Chloride: 103 mmol/L (ref 98–111)
Creatinine, Ser: 1.29 mg/dL — ABNORMAL HIGH (ref 0.61–1.24)
GFR calc Af Amer: >60 mL/min (ref >60)
GFR calc non Af Amer: 53 mL/min — ABNORMAL LOW (ref >60)
Glucose, Bld: 111 mg/dL — ABNORMAL HIGH (ref 70–99)
Potassium: 4.9 mmol/L (ref 3.5–5.1)
Sodium: 143 mmol/L (ref 135–145)

## 2019-09-14 MED ORDER — ACETAMINOPHEN 500 MG PO TABS: 1000.0000 mg | ORAL_TABLET | Freq: Four times a day (QID) | ORAL | 0 refills | Status: AC | PRN

## 2019-09-14 MED ORDER — VALACYCLOVIR HCL 1 G PO TABS: 1000.0000 mg | ORAL_TABLET | Freq: Three times a day (TID) | ORAL | 0 refills | Status: DC

## 2019-09-14 NOTE — ED Notes (Signed)
Placed flat om stretcher, for 10 min for orthostatic VS

## 2019-09-14 NOTE — ED Triage Notes (Signed)
Dizziness since 8am, worse with movement. Also reports painful, blistery rash to L flank for several days.

## 2019-09-14 NOTE — ED Notes (Signed)
Pt and daughter verbalize understanding of d/c instructions

## 2019-09-14 NOTE — ED Provider Notes (Signed)
[facility] EMERGENCY DEPARTMENT Provider Note   CSN: [id number] Arrival date & time: 09/14/19  1328     History   Chief Complaint Chief Complaint  Patient presents with  . Dizziness  . Rash    HPI Dale [name] is a 79 y.o. male.     78 year old male with past medical history including dementia, CAD, hyperlipidemia, GERD who presents with rash as well as dizziness.  Patient notes that he began having a rash a few days ago on his left side.  He reports mild pain mostly when he is sitting in his truck trying to drive.  No severe pain.  No other contacts with similar rash.  No fevers or recent illness.  Patient also reports that he had some dizziness this morning when he sat up from laying down and walked around.  He describes the dizziness as a lightheadedness.  No syncope or room spinning.  Right now he feels fine and denies this symptom.  No vision changes, headache, extremity weakness, or other complaints.  He has not eaten today but reports normal eating and drinking yesterday.  The history is provided by the patient.  Dizziness Rash   Past Medical History:  Diagnosis Date  . Coronary artery disease   . Dementia (HCC)   . GERD (gastroesophageal reflux disease)   . Hyperlipemia   . Leg mass    rt  . Memory loss   . Myocardial infarction less than 4 weeks ago (HCC) 12/29/2016  . Prostate disease   . Quit smoking 11/20/2008   Qualifier: Diagnosis of  By: Deirdre Priest MD, Gaynell Face    . S/P angioplasty with stent to LCX     Patient Active Problem List   Diagnosis Date Noted  . Bradycardia   . CAD S/P percutaneous coronary angioplasty 09/10/2018  . Chest discomfort 05/15/2018  . Memory loss 03/27/2018  . NSTEMI (non-ST elevated myocardial infarction) (HCC) 12/30/2016  . Dyslipidemia 02/04/2015  . BPH (benign prostatic hyperplasia) 10/15/2014  . GERD (gastroesophageal reflux disease) 08/07/2014    Past Surgical History:  Procedure Laterality Date  .  CARDIAC CATHETERIZATION N/A 12/30/2016   Procedure: Coronary Stent Intervention;  Surgeon: Runell Gess, MD;  Location: [facility] INVASIVE CV LAB;  Service: Cardiovascular;  Laterality: N/A;  . CARDIAC CATHETERIZATION N/A 12/30/2016   Procedure: Coronary Angiography;  Surgeon: Runell Gess, MD;  Location: [facility] INVASIVE CV LAB;  Service: Cardiovascular;  Laterality: N/A;  . CARDIAC CATHETERIZATION N/A 12/30/2016   Procedure: Coronary Balloon Angioplasty;  Surgeon: Runell Gess, MD;  Location: [facility] INVASIVE CV LAB;  Service: Cardiovascular;  Laterality: N/A;  . CORONARY STENT PLACEMENT          Home Medications    Prior to Admission medications   Medication Sig Start Date End Date Taking? Authorizing Provider  aspirin 81 MG chewable tablet Chew 1 tablet (81 mg total) by mouth daily. 08/08/14   Pincus Large, DO  clopidogrel (PLAVIX) 75 MG tablet Take 1 tablet (75 mg total) by mouth daily. 12/14/18   Runell Gess, MD  isosorbide mononitrate (IMDUR) 30 MG 24 hr tablet Take 0.5 tablets (15 mg total) by mouth daily. 10/18/18   Abelino Derrick, PA-C  isosorbide mononitrate (IMDUR) 30 MG 24 hr tablet TAKE 1/2 TABLET BY MOUTH DAILY 11/12/18   Carney Living, MD  loratadine (CLARITIN) 10 MG tablet Take 10 mg by mouth daily.    [provider]  memantine (NAMENDA) 10 MG tablet Take 1  tablet (10 mg total) by mouth 2 (two) times daily. 04/01/19   Levert Feinstein, MD  nitroGLYCERIN (NITROSTAT) 0.4 MG SL tablet Place 1 tablet (0.4 mg total) under the tongue every 5 (five) minutes x 3 doses as needed for chest pain. 01/01/17   Bhagat, Sharrell Ku, PA  pantoprazole (PROTONIX) 40 MG tablet TAKE 1 TABLET(40 MG) BY MOUTH DAILY 04/05/19   Carney Living, MD  pravastatin (PRAVACHOL) 40 MG tablet Take 1 tablet (40 mg total) by mouth daily. 09/21/18   Carney Living, MD  tamsulosin (FLOMAX) 0.4 MG CAPS capsule TAKE 1 CAPSULE(0.4 MG) BY MOUTH DAILY AFTER SUPPER 07/04/19   Chambliss, Estill Batten,  MD  valACYclovir (VALTREX) 1000 MG tablet Take 1 tablet (1,000 mg total) by mouth 3 (three) times daily. 09/14/19   Anwyn Kriegel, Ambrose Finland, MD    Family History Family History  Problem Relation Age of Onset  . Cancer Mother        Posey Rea of type  . Heart attack Father   . Alzheimer's disease Father     Social History Social History   Tobacco Use  . Smoking status: Former Smoker    Packs/day: 1.00    Years: 20.00    Pack years: 20.00    Types: Cigarettes    Quit date: 10/23/2011    Years since quitting: 7.8  . Smokeless tobacco: Never Used  Substance Use Topics  . Alcohol use: No    Comment: occ  . Drug use: Not Currently     Allergies   Patient has no known allergies.   Review of Systems Review of Systems  Unable to perform ROS: Dementia  Skin: Positive for rash.  Neurological: Positive for dizziness.     Physical Exam Updated Vital Signs BP 123/78   Pulse 76   Temp 99 F (37.2 C) (Oral)   Resp 15   Ht 5\' 5"  (1.651 m)   Wt 74.8 kg   SpO2 98%   BMI 27.46 kg/m   Physical Exam Vitals signs and nursing note reviewed.  Constitutional:      General: He is not in acute distress.    Appearance: He is well-developed.     Comments: Awake, alert  HENT:     Head: Normocephalic and atraumatic.     Nose: Nose normal.     Mouth/Throat:     Mouth: Mucous membranes are moist.     Pharynx: Oropharynx is clear.  Eyes:     Extraocular Movements: Extraocular movements intact.     Conjunctiva/sclera: Conjunctivae normal.     Pupils: Pupils are equal, round, and reactive to light.  Neck:     Musculoskeletal: Neck supple.  Pulmonary:     Effort: Pulmonary effort is normal.  Musculoskeletal:        General: No tenderness.  Skin:    General: Skin is warm and dry.     Findings: Rash present.     Comments: Vesicular skin eruption in several patches across L chest around to L upper back, does not cross midline  Neurological:     Mental Status: He is alert and  oriented to person, place, and time.     Cranial Nerves: No cranial nerve deficit.     Motor: No abnormal muscle tone.     Deep Tendon Reflexes: Reflexes are normal and symmetric.     Comments: Fluent speech, normal finger-to-nose testing, negative pronator drift, no clonus 5/5 strength and normal sensation x all 4 extremities  Psychiatric:  Thought Content: Thought content normal.        Judgment: Judgment normal.      ED Treatments / Results  Labs (all labs ordered are listed, but only abnormal results are displayed) Labs Reviewed  CBC WITH DIFFERENTIAL/PLATELET - Abnormal; Notable for the following components:      Result Value   MCHC 29.7 (*)    All other components within normal limits  BASIC METABOLIC PANEL - Abnormal; Notable for the following components:   Glucose, Bld 111 (*)    Creatinine, Ser 1.29 (*)    GFR calc non Af Amer 53 (*)    All other components within normal limits  CBG MONITORING, ED - Abnormal; Notable for the following components:   Glucose-Capillary 102 (*)    All other components within normal limits    EKG EKG Interpretation  Date/Time:  Saturday September 14 2019 13:52:08 EDT Ventricular Rate:  72 PR Interval:    QRS Duration: 83 QT Interval:  397 QTC Calculation: 435 R Axis:   32 Text Interpretation:  Sinus rhythm Prolonged PR interval Posterior infarct, old No significant change since last tracing Confirmed by Theotis Burrow [id number]) on 09/14/2019 1:56:17 PM   Radiology No results found.  Procedures Procedures (including critical care time)  Medications Ordered in ED Medications - No data to display  Orthostatic VS for the past 24 hrs:  BP- Lying Pulse- Lying BP- Sitting Pulse- Sitting BP- Standing at 0 minutes Pulse- Standing at 0 minutes  09/14/19 1446 118/80 63 119/71 59 118/68 76     Initial Impression / Assessment and Plan / ED Course  I have reviewed the triage vital signs and the nursing notes.  Pertinent labs &  imaging results that were available during my care of the patient were reviewed by me and considered in my medical decision making (see chart for details).        Rashes consistent with shingles.  His pain appears to be well controlled without use of narcotics.  Will start on valacyclovir, discussed expected course, precautions regarding staying away from immunocompromised family/friends, and follow-up with PCP.  Regarding his dizziness, he denies any symptoms that suggest vertigo or other acute central process.  His vital signs are normal here, orthostatics negative.  Screening lab work shows no evidence of anemia.  Creatinine today is 1.29, not significantly different from previous, and BUN is normal.  Discussed supportive measures including caution when standing and walking.  Return precautions reviewed with patient and his family member.  They voiced understanding.  Daughter requested pain medication for him; upon speaking with the patient, he states that he has not taken any medication for the pain.  Will start on scheduled Tylenol as he has denied any significant pain here and I am concerned about risk of side effects such as dizziness and balance problems especially given that he is complaining of dizziness here.  Final Clinical Impressions(s) / ED Diagnoses   Final diagnoses:  Herpes zoster without complication    ED Discharge Orders         Ordered    valACYclovir (VALTREX) 1000 MG tablet  3 times daily     09/14/19 1448           Markice Torbert, Wenda Overland, MD 09/14/19 1501

## 2019-09-17 ENCOUNTER — Other Ambulatory Visit: Payer: Self-pay

## 2019-09-17 ENCOUNTER — Ambulatory Visit (INDEPENDENT_AMBULATORY_CARE_PROVIDER_SITE_OTHER): Payer: Medicare HMO | Admitting: Family Medicine

## 2019-09-17 ENCOUNTER — Encounter: Payer: Self-pay | Admitting: Family Medicine

## 2019-09-17 DIAGNOSIS — I959 Hypotension, unspecified: Secondary | ICD-10-CM | POA: Insufficient documentation

## 2019-09-17 DIAGNOSIS — I952 Hypotension due to drugs: Secondary | ICD-10-CM

## 2019-09-17 DIAGNOSIS — B029 Zoster without complications: Secondary | ICD-10-CM

## 2019-09-17 DIAGNOSIS — Z23 Encounter for immunization: Secondary | ICD-10-CM

## 2019-09-17 DIAGNOSIS — R0789 Other chest pain: Secondary | ICD-10-CM | POA: Diagnosis not present

## 2019-09-17 NOTE — Patient Instructions (Addendum)
Good to see you today!  Thanks for coming in.  Stop the 1/2 tab of imdur.  This should help with the lightheadness.  If you start having chest pain then call me or your cardiologist  If the lightheadness does not get better then let me know  Make sure taking the valcyclovir three times a day until gone  If the shingles hurts a lot then lets [place] know

## 2019-09-17 NOTE — Progress Notes (Signed)
Subjective  Dale [name] is a 78 y.o. male is presenting with the following  LIGHTHEADEDNESS For last several weeks feels like might pass out when stands up.  Has not fallen but has stumbled.  No chest pain with exertion (he walks a lot)  Taking 1/2 imdur daily and flomax daily.  No edema or shortness of breath   SHINGLES Unsure if he is taking the valcyclovir regularly. His caregiver who arranges his medications is not here today.  They are painful but bearable with tylenol.  No fever or any spread of blisters   Chief Complaint noted Review of Symptoms - see HPI PMH - Smoking status noted.    Objective Vital Signs reviewed BP 122/72   Pulse 65   Wt 155 lb (70.3 kg)   SpO2 98%   BMI 25.79 kg/m  Alert knows me but unsure of specific dates or of his medications Accompanied by his son Heart - Regular rate and rhythm.  No murmurs, gallops or rubs.    Lungs:  Normal respiratory effort, chest expands symmetrically. Lungs are clear to auscultation, no crackles or wheezes. Extremities:  No cyanosis, edema, or deformity noted with good range of motion of all major joints.   Neck:  No deformities, thyromegaly, masses, or tenderness noted.   Supple with full range of motion without pain. 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   Urged to finish antivirals   Chest discomfort Denies any recent chest pain with exertion or at rest.  Son does not know of any   Hypotension I think his hypotension may be due to Imdur.  Since he has almost fallen and has had no recent chest pain and is on a low dose will stop this and monitor for chest pain and improvement in symptoms.  Continue ASA and plavix    See after visit summary for details of patient instructions

## 2019-09-17 NOTE — Assessment & Plan Note (Addendum)
I think his hypotension may be due to Imdur.  Since he has almost fallen and has had no recent chest pain and is on a low dose will stop this and monitor for chest pain and improvement in symptoms.  Continue ASA and plavix

## 2019-09-17 NOTE — Assessment & Plan Note (Signed)
Denies any recent chest pain with exertion or at rest.  Son does not know of any

## 2019-09-17 NOTE — Assessment & Plan Note (Signed)
Stable.   Urged to finish antivirals

## 2019-09-18 ENCOUNTER — Other Ambulatory Visit: Payer: Self-pay | Admitting: Cardiovascular Disease

## 2019-09-18 NOTE — Telephone Encounter (Signed)
Pt overdue for 4 month f/u. Please contact pt for future appointment.

## 2019-09-27 ENCOUNTER — Other Ambulatory Visit: Payer: Self-pay

## 2019-09-27 ENCOUNTER — Encounter: Payer: Self-pay | Admitting: Cardiovascular Disease

## 2019-09-27 ENCOUNTER — Ambulatory Visit (INDEPENDENT_AMBULATORY_CARE_PROVIDER_SITE_OTHER): Payer: Medicare HMO | Admitting: Cardiovascular Disease

## 2019-09-27 DIAGNOSIS — E785 Hyperlipidemia, unspecified: Secondary | ICD-10-CM | POA: Diagnosis not present

## 2019-09-27 DIAGNOSIS — I214 Non-ST elevation (NSTEMI) myocardial infarction: Secondary | ICD-10-CM

## 2019-09-27 NOTE — Assessment & Plan Note (Signed)
History of dyslipidemia not on statin therapy with lipid profile performed 09/11/2018 revealing total cholesterol 153, LDL 70 and HDL 65

## 2019-09-27 NOTE — Patient Instructions (Signed)

## 2019-09-27 NOTE — Progress Notes (Signed)
09/27/2019 Dale [name]   06/22/1941  [id number]  Primary Physician Dale Covert, MD Primary Cardiologist: Dale Harp MD Dale [name], [place]  HPI:  Dale [name] is a 78 y.o.  thin appearing African-American male accompanied by his son Systems analyst today.  I last saw him in the office 02/07/2017.  I first met him at the time of his non-STEMI 12/30/16. 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 The remainder of his coronary anatomy was free of significant disease and his LV function was normal. His other problems include a history of hyperlipidemia and remote tobacco abuse. He was discharged home 2 days later. He saw Dale [name] in the office one week later and was doing well.  Since I saw him almost 3 years ago he is remained stable.  He denies chest pain or shortness of breath.   Current Meds  Medication Sig  . aspirin 81 MG chewable tablet Chew 1 tablet (81 mg total) by mouth daily.  . clopidogrel (PLAVIX) 75 MG tablet TAKE 1 TABLET(75 MG) BY MOUTH DAILY  . loratadine (CLARITIN) 10 MG tablet Take 10 mg by mouth daily.  . memantine (NAMENDA) 10 MG tablet Take 1 tablet (10 mg total) by mouth 2 (two) times daily.  . nitroGLYCERIN (NITROSTAT) 0.4 MG SL tablet Place 1 tablet (0.4 mg total) under the tongue every 5 (five) minutes x 3 doses as needed for chest pain.  . pantoprazole (PROTONIX) 40 MG tablet TAKE 1 TABLET(40 MG) BY MOUTH DAILY  . tamsulosin (FLOMAX) 0.4 MG CAPS capsule TAKE 1 CAPSULE(0.4 MG) BY MOUTH DAILY AFTER SUPPER  . valACYclovir (VALTREX) 1000 MG tablet Take 1 tablet (1,000 mg total) by mouth 3 (three) times daily.     No Known Allergies  Social History   Socioeconomic History  . Marital status: Widowed    Spouse name: Not on file  . Number of children: 2  . Years of education: 6th  . Highest  education level: Not on file  Occupational History  . Occupation: Retired  Scientific laboratory technician  . Financial resource strain: Not on file  . Food insecurity    Worry: Not on file    Inability: Not on file  . Transportation needs    Medical: Not on file    Non-medical: Not on file  Tobacco Use  . Smoking status: Former Smoker    Packs/day: 1.00    Years: 20.00    Pack years: 20.00    Types: Cigarettes    Quit date: 10/23/2011    Years since quitting: 7.9  . Smokeless tobacco: Never Used  Substance and Sexual Activity  . Alcohol use: No    Comment: occ  . Drug use: Not Currently  . Sexual activity: Not Currently    Comment: Patient would like to be. Discussed wearing condom if becomes sexually active  Lifestyle  . Physical activity    Days per week: Not on file    Minutes per session: Not on file  . Stress: Not on file  Relationships  . Social Herbalist on phone: Not on file    Gets together: Not on file    Attends religious service: Not on file    Active member of club or organization: Not on file    Attends meetings of clubs or organizations: Not on  file    Relationship status: Not on file  . Intimate partner violence    Fear of current or ex partner: Not on file    Emotionally abused: Not on file    Physically abused: Not on file    Forced sexual activity: Not on file  Other Topics Concern  . Not on file  Social History Narrative   Past Medical History:      EST approximately 2002         Family History:      Sister had CABG in her 53s       Social History:      He is retired frmm working for city.  Does yard work with tractor and rides motorcycles. Has 2 children.  Sees his grandkids regularly.        Right-handed.   Occasional caffeine use.      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 [place], RN, BSN                                                                                                   Review of Systems: General: negative for chills, fever, night sweats or weight changes.  Cardiovascular: negative for chest pain, dyspnea on exertion, edema, orthopnea, palpitations, paroxysmal nocturnal dyspnea or shortness of breath Dermatological: negative for rash Respiratory: negative for cough or wheezing Urologic: negative for hematuria Abdominal: negative for nausea, vomiting, diarrhea, bright red blood per rectum, melena, or hematemesis Neurologic: negative for visual changes, syncope, or dizziness All other systems reviewed and are otherwise negative except as noted above.    Blood pressure 116/62, pulse (!) 55, temperature 97.6 F (36.4 C), height _0  (1.651 m), weight 142 lb (64.4 kg).  General appearance: alert and no distress Neck: no adenopathy, no carotid bruit, no JVD, supple, symmetrical, trachea midline and thyroid not enlarged, symmetric, no tenderness/mass/nodules Lungs: clear to auscultation bilaterally Heart: regular rate and rhythm, S1, S2 normal, no murmur, click, rub or gallop Extremities: extremities normal, atraumatic, no cyanosis or edema Pulses: 2+ and symmetric Skin: Skin color, texture, turgor normal. No rashes or lesions Neurologic: Alert and oriented X 3, normal strength and tone. Normal symmetric reflexes. Normal coordination and gait  EKG not performed today  ASSESSMENT AND PLAN:   Dyslipidemia History of dyslipidemia not on statin therapy with lipid profile performed 09/11/2018 revealing total cholesterol 153, LDL 70 and HDL 65  NSTEMI  (non-ST elevated myocardial infarction) ([place]) History of non-STEMI 12/30/2016 with cath performed by myself via the right radial approach revealing occluded circumflex in the proximal AV  groove which I stented with a Medtronic resolute drug-eluting stent.  The remainder of his anatomy was free of significant disease and he had normal LV function.  He is on dual antiplatelet therapy.  He denies chest pain or shortness of breath.      Dale Harp MD FACP,FACC,FAHA, [facility] 09/27/2019 11:35 AM

## 2019-09-27 NOTE — Assessment & Plan Note (Signed)
History of non-STEMI 12/30/2016 with cath performed by myself via the right radial approach revealing occluded circumflex in the proximal AV groove which I stented with a Medtronic resolute drug-eluting stent.  The remainder of his anatomy was free of significant disease and he had normal LV function.  He is on dual antiplatelet therapy.  He denies chest pain or shortness of breath.

## 2019-10-01 ENCOUNTER — Other Ambulatory Visit: Payer: Self-pay | Admitting: Family Medicine

## 2019-10-01 DIAGNOSIS — R350 Frequency of micturition: Secondary | ICD-10-CM

## 2019-10-01 DIAGNOSIS — N400 Enlarged prostate without lower urinary tract symptoms: Secondary | ICD-10-CM

## 2019-10-30 ENCOUNTER — Encounter: Payer: Self-pay | Admitting: Family Medicine

## 2019-10-30 ENCOUNTER — Ambulatory Visit (INDEPENDENT_AMBULATORY_CARE_PROVIDER_SITE_OTHER): Payer: Medicare HMO | Admitting: Family Medicine

## 2019-10-30 ENCOUNTER — Other Ambulatory Visit: Payer: Self-pay

## 2019-10-30 DIAGNOSIS — H8110 Benign paroxysmal vertigo, unspecified ear: Secondary | ICD-10-CM | POA: Insufficient documentation

## 2019-10-30 NOTE — Progress Notes (Signed)
Subjective  Dale [name] is a 78 y.o. male is presenting with the following  DIZZINESS  Feeling dizzy for 7-10 days. Dizziness described as:  Manufacturing systems engineer like room spins: sometimes Lightheadedness when stands: only first thing in AM Palpitations or heart racing: no Prior dizziness: no Medications tried: none The dizziness improves through out the day is worse first wakes up  Taking blood thinners: plavix and asa  Symptoms Hearing Loss: no Ear Pain or fullness: no Nausea or vomiting: no Vision difficulty or double vision: no Falls: no Head trauma: no Weakness in arm or leg: no Speaking problems: no Headache: no  ROS see HPI Smoking Status noted Chief Complaint noted Review of Symptoms - see HPI PMH - Smoking status noted.    Objective Vital Signs reviewed BP 120/70   Pulse (!) 59   Wt 155 lb 3.2 oz (70.4 kg)   SpO2 99%   BMI 25.83 kg/m  Neurologic exam : Cn 2-7 intact Strength equal & normal in upper & lower extremities Able to walk on heels and toes.   Balance normal  Romberg normal, finger to nose normal Eye - Pupils Equal Round Reactive to light, Extraocular movements intact, Fundi without hemorrhage or visible lesions, Conjunctiva without redness or discharge L TM - dull compared to R.  No pain or discharge Mobility:able to get up and down from exam table without assistance or distress  Assessments/Plans  BPPV (benign paroxysmal positional vertigo) Symptoms seem most consistent with BPPV.  No signs of stroke or ear infection.  Does not seem severe enough to treat with meclizine given the sedation/  Discussed staying active    See after visit summary for details of patient instructions

## 2019-10-30 NOTE — Patient Instructions (Signed)
Good to see you today!  Thanks for coming in.  I think you have BPPV Benign paroxysmal positional vertigo  If you have weakness or change in vision or falling or ear pain then call me  The more you move around the quicker this will get better. If getting a lot worse then let me know  Check your medications on the list to make sure you are taking all of these

## 2019-10-30 NOTE — Assessment & Plan Note (Signed)
Symptoms seem most consistent with BPPV.  No signs of stroke or ear infection.  Does not seem severe enough to treat with meclizine given the sedation/  Discussed staying active

## 2019-11-20 ENCOUNTER — Other Ambulatory Visit: Payer: Self-pay | Admitting: Family Medicine

## 2019-11-20 DIAGNOSIS — E785 Hyperlipidemia, unspecified: Secondary | ICD-10-CM

## 2019-11-21 NOTE — Assessment & Plan Note (Signed)
Patient had been on pravastatin regularly.  Not sure how dropped off his medication list. He probably has been taking it.  Will refill prescription request

## 2019-11-24 ENCOUNTER — Other Ambulatory Visit: Payer: Self-pay | Admitting: Family Medicine

## 2019-12-31 ENCOUNTER — Other Ambulatory Visit: Payer: Self-pay | Admitting: *Deleted

## 2019-12-31 DIAGNOSIS — N400 Enlarged prostate without lower urinary tract symptoms: Secondary | ICD-10-CM

## 2019-12-31 DIAGNOSIS — R350 Frequency of micturition: Secondary | ICD-10-CM

## 2019-12-31 MED ORDER — TAMSULOSIN HCL 0.4 MG PO CAPS: ORAL_CAPSULE | ORAL | 3 refills | Status: DC

## 2020-03-03 ENCOUNTER — Encounter: Payer: Self-pay | Admitting: Family Medicine

## 2020-03-03 ENCOUNTER — Ambulatory Visit (INDEPENDENT_AMBULATORY_CARE_PROVIDER_SITE_OTHER): Payer: Medicare HMO | Admitting: Family Medicine

## 2020-03-03 ENCOUNTER — Other Ambulatory Visit: Payer: Self-pay

## 2020-03-03 DIAGNOSIS — H8110 Benign paroxysmal vertigo, unspecified ear: Secondary | ICD-10-CM | POA: Diagnosis not present

## 2020-03-03 NOTE — Progress Notes (Signed)
    SUBJECTIVE:   CHIEF COMPLAINT / HPI:   Dizziness - Mr. Deems endorses a sensation of dizziness when he stands up from a stooped over position. This has been going on since November of 2020, and happens about 2-3x a day, more or less depending on what he's doing that day. His dizziness is not described as a lightheadedness or spinning. He endorses feeling off for 1-2 seconds after changing positions. He does not endorse any ear pain, ear fullness, hearing loss, vision changes, nausea/vomiting, weakness, headaches or falls. He has not tried any medications for his symptoms.  Patient also has sensation when he first wakes up and rolls over in bed before getting up   Lightheadedness - patients also endorses feeling lightheaded when standing up abruptly.   PERTINENT  PMH / PSH: BPPV, BPH, CAD, memory loss  OBJECTIVE:   BP 132/72   Pulse (!) 58   Ht 5\' 5"  (1.651 m)   Wt 156 lb (70.8 kg)   SpO2 98%   BMI 25.96 kg/m   HEENT: PERRL, extraocular movements intacts, conjunctiva without redness or discharge Neurological Exam Mental Status Awake and alert. Oriented only to person, place and time.  Cranial Nerves CN II: Right visual acuity: normal. Left visual acuity: normal. Right normal visual field. CN III, IV, VI: Extraocular movements intact bilaterally. Normal lids and orbits bilaterally. Pupils equal round and reactive to light bilaterally. CN V: Facial sensation is normal. CN VII: Full and symmetric facial movement. CN VIII: Hearing is normal. CN XI: Shoulder shrug strength is normal. CN XII: Tongue midline without atrophy or fasciculations.  Motor Normal muscle bulk throughout. No fasciculations present. Normal muscle tone. No abnormal involuntary movements. Strength is 5/5 throughout all four extremities.  Coordination Right: Finger-to-nose normal.  Gait Normal casual, toe, heel and tandem gait. Romberg is absent.   ASSESSMENT/PLAN:   BPPV (benign paroxysmal positional  vertigo) Referral to vestibular rehab. Instructed patient to call if worsening symptoms, ear pain, hearing loss, nausea, chest pain, or palpitations occur   COVID-19 vaccination - discussed and recommended   Sharah Finnell M [place] Mckesson, Medical Student [facility]    I was present for relevant parts of history physical and decision making and agree with above [facility]

## 2020-03-03 NOTE — Assessment & Plan Note (Addendum)
Referral to vestibular rehab. Instructed patient to call if worsening symptoms, ear pain, hearing loss, nausea, chest pain, or palpitations occur

## 2020-03-03 NOTE — Patient Instructions (Addendum)
It was great seeing you today! Thank you for coming in!  We will send over a referral to vestibular rehab to see if the dizziness gets better. If you have any worsening symptoms, ear pain, hearing loss, or nausea, chest pain, palpitations, falling please give [place] a call.  I would definitely recommend you get the COVID vaccine.  Getting a Covid Vaccine is very important because of your health conditions,   You can call [phone] or go to [email or web address] or contact a local [facility] or [facility] .       Thanks!

## 2020-04-21 ENCOUNTER — Other Ambulatory Visit: Payer: Self-pay | Admitting: Neurology

## 2020-04-22 ENCOUNTER — Other Ambulatory Visit: Payer: Self-pay

## 2020-04-22 MED ORDER — PANTOPRAZOLE SODIUM 40 MG PO TBEC: DELAYED_RELEASE_TABLET | ORAL | 1 refills | Status: DC

## 2020-06-08 DIAGNOSIS — Z0100 Encounter for examination of eyes and vision without abnormal findings: Secondary | ICD-10-CM | POA: Diagnosis not present

## 2020-08-19 ENCOUNTER — Other Ambulatory Visit: Payer: Self-pay | Admitting: Neurology

## 2020-09-21 ENCOUNTER — Other Ambulatory Visit: Payer: Self-pay | Admitting: Cardiovascular Disease

## 2020-09-26 ENCOUNTER — Other Ambulatory Visit: Payer: Self-pay | Admitting: Neurology

## 2020-09-28 ENCOUNTER — Other Ambulatory Visit: Payer: Self-pay

## 2020-09-29 MED ORDER — PANTOPRAZOLE SODIUM 40 MG PO TBEC: DELAYED_RELEASE_TABLET | ORAL | 1 refills | Status: DC

## 2020-10-29 ENCOUNTER — Other Ambulatory Visit: Payer: Self-pay

## 2020-10-29 ENCOUNTER — Ambulatory Visit (INDEPENDENT_AMBULATORY_CARE_PROVIDER_SITE_OTHER): Payer: Medicare HMO

## 2020-10-29 VITALS — BP 102/58 | HR 54 | Ht 65.0 in | Wt 155.0 lb

## 2020-10-29 DIAGNOSIS — Z23 Encounter for immunization: Secondary | ICD-10-CM | POA: Diagnosis not present

## 2020-10-29 DIAGNOSIS — Z0000 Encounter for general adult medical examination without abnormal findings: Secondary | ICD-10-CM

## 2020-10-29 NOTE — Patient Instructions (Addendum)
You spoke to Dale [name], [place]  for your annual wellness visit.  We discussed goals: Goals    . Maintain current level of physical activity      We also discussed recommended health maintenance. As discussed, you are due for the following. Flu was given today and I have updated your Covid Vaccine status. We have scheduled you to see PCP on December 1st.  Health Maintenance  Topic Date Due  . DTAP VACCINES (1) 08/21/1941  . DTaP/Tdap/Td (2 - Tdap) 11/20/2018  . TETANUS/TDAP  11/20/2018  . INFLUENZA VACCINE  Completed  . COVID-19 Vaccine  Completed  . Hepatitis C Screening  Completed  . PNA vac Low Risk Adult  Completed   Flu Vaccine given today. Apt with PCP scheduled 11/11/2020 _0 . Bring your daughter and your medications! Fill out the advance directive packet.  Stay active!  Preventive Care 77 Years and Older, Male Preventive care refers to lifestyle choices and visits with your health care provider that can promote health and wellness. This includes:  A yearly physical exam. This is also called an annual well check.  Regular dental and eye exams.  Immunizations.  Screening for certain conditions.  Healthy lifestyle choices, such as diet and exercise. What can I expect for my preventive care visit? Physical exam Your health care provider will check:  Height and weight. These may be used to calculate body mass index (BMI), which is a measurement that tells if you are at a healthy weight.  Heart rate and blood pressure.  Your skin for abnormal spots. Counseling Your health care provider may ask you questions about:  Alcohol, tobacco, and drug use.  Emotional well-being.  Home and relationship well-being.  Sexual activity.  Eating habits.  History of falls.  Memory and ability to understand (cognition).  Work and work Statistician. What immunizations do I need?  Influenza (flu) vaccine  This is recommended every year. Tetanus, diphtheria, and  pertussis (Tdap) vaccine  You may need a Td booster every 10 years. Varicella (chickenpox) vaccine  You may need this vaccine if you have not already been vaccinated. Zoster (shingles) vaccine  You may need this after age 67. Pneumococcal conjugate (PCV13) vaccine  One dose is recommended after age 45. Pneumococcal polysaccharide (PPSV23) vaccine  One dose is recommended after age 44. Measles, mumps, and rubella (MMR) vaccine  You may need at least one dose of MMR if you were born in 1957 or later. You may also need a second dose. Meningococcal conjugate (MenACWY) vaccine  You may need this if you have certain conditions. Hepatitis A vaccine  You may need this if you have certain conditions or if you travel or work in places where you may be exposed to hepatitis A. Hepatitis B vaccine  You may need this if you have certain conditions or if you travel or work in places where you may be exposed to hepatitis B. Haemophilus influenzae type b (Hib) vaccine  You may need this if you have certain conditions. You may receive vaccines as individual doses or as more than one vaccine together in one shot (combination vaccines). Talk with your health care provider about the risks and benefits of combination vaccines. What tests do I need? Blood tests  Lipid and cholesterol levels. These may be checked every 5 years, or more frequently depending on your overall health.  Hepatitis C test.  Hepatitis B test. Screening  Lung cancer screening. You may have this screening every year starting at age  55 if you have a 30-pack-year history of smoking and currently smoke or have quit within the past 15 years.  Colorectal cancer screening. All adults should have this screening starting at age 50 and continuing until age 28. Your health care provider may recommend screening at age 83 if you are at increased risk. You will have tests every 1-10 years, depending on your results and the type of  screening test.  Prostate cancer screening. Recommendations will vary depending on your family history and other risks.  Diabetes screening. This is done by checking your blood sugar (glucose) after you have not eaten for a while (fasting). You may have this done every 1-3 years.  Abdominal aortic aneurysm (AAA) screening. You may need this if you are a current or former smoker.  Sexually transmitted disease (STD) testing. Follow these instructions at home: Eating and drinking  Eat a diet that includes fresh fruits and vegetables, whole grains, lean protein, and low-fat dairy products. Limit your intake of foods with high amounts of sugar, saturated fats, and salt.  Take vitamin and mineral supplements as recommended by your health care provider.  Do not drink alcohol if your health care provider tells you not to drink.  If you drink alcohol: ? Limit how much you have to 0-2 drinks a day. ? Be aware of how much alcohol is in your drink. In the [place] one drink equals one 12 oz bottle of beer (355 mL), one 5 oz glass of wine (148 mL), or one 1 oz glass of hard liquor (44 mL). Lifestyle  Take daily care of your teeth and gums.  Stay active. Exercise for at least 30 minutes on 5 or more days each week.  Do not use any products that contain nicotine or tobacco, such as cigarettes, e-cigarettes, and chewing tobacco. If you need help quitting, ask your health care provider.  If you are sexually active, practice safe sex. Use a condom or other form of protection to prevent STIs (sexually transmitted infections).  Talk with your health care provider about taking a low-dose aspirin or statin. What's next?  Visit your health care provider once a year for a well check visit.  Ask your health care provider how often you should have your eyes and teeth checked.  Stay up to date on all vaccines. This information is not intended to replace advice given to you by your health care provider.  Make sure you discuss any questions you have with your health care provider. Document Revised: 11/22/2018 Document Reviewed: 11/22/2018 Elsevier Patient Education  2020 [facility]'s number is [phone]. Please call with questions or concerns about what we discussed today.

## 2020-10-29 NOTE — Progress Notes (Addendum)
Subjective:   Dale [name] is a 79 y.o. male who presents for Medicare Annual/Subsequent preventive examination.  Review of Systems: Defer to PCP.  Cardiac Risk Factors include: advanced age (>57men, >34 women)  Objective:    Vitals: BP (!) 102/58   Pulse (!) 54   Ht 5\' 5"  (1.651 m)   Wt 155 lb (70.3 kg)   SpO2 98%   BMI 25.79 kg/m   Body mass index is 25.79 kg/m.  Advanced Directives 10/29/2020 03/03/2020 10/30/2019 09/14/2019 09/26/2018 09/11/2018 05/15/2018  Does Patient Have a Medical Advance Directive? No No No No No No No  Type of Advance Directive - - - - - - -  Does patient want to make changes to medical advance directive? Yes (MAU/Ambulatory/Procedural Areas - Information given) - - - - - -  Would patient like information on creating a medical advance directive? No - Patient declined No - Patient declined No - Patient declined - No - Patient declined No - Patient declined No - Patient declined   Tobacco Social History   Tobacco Use  Smoking Status Former Smoker  . Packs/day: 1.00  . Years: 20.00  . Pack years: 20.00  . Types: Cigarettes  . Quit date: 10/23/2011  . Years since quitting: 9.0  Smokeless Tobacco Never Used     Counseling given: No plans to restart.  Clinical Intake:  Pre-visit preparation completed: Yes  Pain Score: 0-No pain  How often do you need to have someone help you when you read instructions, pamphlets, or other written materials from your doctor or pharmacy?: 4 - Often What is the last grade level you completed in school?: patient completed the 5th grade.  Interpreter Needed?: No  Past Medical History:  Diagnosis Date  . Coronary artery disease   . Dementia (HCC)   . GERD (gastroesophageal reflux disease)   . Hyperlipemia   . Leg mass    rt  . Memory loss   . Myocardial infarction less than 4 weeks ago (HCC) 12/29/2016  . Prostate disease   . Quit smoking 11/20/2008   Qualifier: Diagnosis of  By: 14/09/2008 MD, Deirdre Priest      . S/P angioplasty with stent to LCX    Past Surgical History:  Procedure Laterality Date  . CARDIAC CATHETERIZATION N/A 12/30/2016   Procedure: Coronary Stent Intervention;  Surgeon: 01/01/2017, MD;  [facility] INVAS[facility] CV LAB;  Service: Cardiovascular;  Laterality: N/A;  . CARDIAC CATHETERIZATION N/A 12/30/2016   Procedure: Coronary Angiography;  Surgeon: 01/01/2017, MD;  Location: [facility] INVASIVE CV LAB;  Service: Cardiovascular;  Laterality: N/A;  . CARDIAC CATHETERIZATION N/A 12/30/2016   Procedure: Coronary Balloon Angioplasty;  Surgeon: 01/01/2017, MD;  Location: [facility] INVASIVE CV LAB;  Service: Cardiovascular;  Laterality: N/A;  . CORONARY STENT PLACEMENT     Family History  Problem Relation Age of Onset  . Cancer Mother        Runell Gess of type  . Heart attack Father   . Alzheimer's disease Father    Social History   Socioeconomic History  . Marital status: Widowed    Spouse name: Not on file  . Number of children: 2  . Years of education: 6th  . Highest education level: 5th grade  Occupational History  . Occupation: Retired  Tobacco Use  . Smoking status: Former Smoker    Packs/day: 1.00    Years: 20.00    Pack years: 20.00    Types: Cigarettes  Quit date: 10/23/2011    Years since quitting: 9.0  . Smokeless tobacco: Never Used  Vaping Use  . Vaping Use: Never used  Substance and Sexual Activity  . Alcohol use: No    Comment: occ  . Drug use: Not Currently  . Sexual activity: Not Currently    Comment: Patient would like to be. Discussed wearing condom if becomes sexually active  Other Topics Concern  . Not on file  Social History Narrative   Patient lives in his home with his daughter.   Patient is a widow ~ 10 years now.   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    Patient enjoys gardening and staying active outside.    Patient enjoys spending time with his family and going to the restaurant down the  street.   Social Determinants of Health   Financial Resource Strain: Low Risk   . Difficulty of Paying Living Expenses: Not hard at all  Food Insecurity: No Food Insecurity  . Worried About Programme researcher, broadcasting/film/video in the Last Year: Never true  . Ran Out of Food in the Last Year: Never true  Transportation Needs: No Transportation Needs  . Lack of Transportation (Medical): No  . Lack of Transportation (Non-Medical): No  Physical Activity: Sufficiently Active  . Days of Exercise per Week: 7 days  . Minutes of Exercise per Session: 60 min  Stress: No Stress Concern Present  . Feeling of Stress : Only a little  Social Connections: Socially Isolated  . Frequency of Communication with Friends and Family: More than three times a week  . Frequency of Social Gatherings with Friends and Family: More than three times a week  . Attends Religious Services: Never  . Active Member of Clubs or Organizations: No  . Attends Banker Meetings: Never  . Marital Status: Widowed   Outpatient Encounter Medications as of 10/29/2020  Medication Sig  . aspirin 81 MG chewable tablet Chew 1 tablet (81 mg total) by mouth daily.  . clopidogrel (PLAVIX) 75 MG tablet TAKE 1 TABLET(75 MG) BY MOUTH DAILY  . loratadine (CLARITIN) 10 MG tablet Take 10 mg by mouth daily.  . memantine (NAMENDA) 10 MG tablet Take 1 tab twice daily. Please call [phone] for yearly appt or may request further refills from PCP.  Marland Kitchen nitroGLYCERIN (NITROSTAT) 0.4 MG SL tablet Place 1 tablet (0.4 mg total) under the tongue every 5 (five) minutes x 3 doses as needed for chest pain.  . pantoprazole (PROTONIX) 40 MG tablet TAKE 1 TABLET(40 MG) BY MOUTH DAILY  . pravastatin (PRAVACHOL) 40 MG tablet TAKE 1 TABLET(40 MG) BY MOUTH DAILY  . tamsulosin (FLOMAX) 0.4 MG CAPS capsule TAKE 1 CAPSULE(0.4 MG) BY MOUTH DAILY AFTER SUPPER  . valACYclovir (VALTREX) 1000 MG tablet Take 1 tablet (1,000 mg total) by mouth 3 (three) times daily.   No  facility-administered encounter medications on file as of 10/29/2020.   Activities of Daily Living In your present state of health, do you have any difficulty performing the following activities: 10/29/2020 10/29/2020  Hearing? - N  Vision? - N  Difficulty concentrating or making decisions? - Y  Comment - daughter helps  Walking or climbing stairs? - N  Dressing or bathing? - N  Doing errands, shopping? - N  Quarry manager and eating ? - N  Using the Toilet? - N  In the past six months, have you accidently leaked urine? - N  Do you  have problems with loss of bowel control? - N  Managing your Medications? Y N  Comment "my medications are laid out for me daily" -  Managing your Finances? - N  Housekeeping or managing your Housekeeping? - N  Some recent data might be hidden   Patient reports he does get help from his daughter for various things.  They split the bills. She helps him with his medications when needed.  Patient Care Team: Carney Living, MD as PCP - General Levert Feinstein, MD as Consulting Physician (Neurology)   Assessment:   This is a routine wellness examination for Montie.  Exercise Activities and Dietary recommendations Current Exercise Habits: The patient has a physically strenuous job, but has no regular exercise apart from work.  Goals    . Maintain current level of physical activity      Fall Risk Fall Risk  10/29/2020 03/03/2020 10/30/2019 09/26/2018 09/10/2018  Falls in the past year? 0 0 0 No No  Number falls in past yr: - 0 0 - -  Injury with Fall? - 0 - - -   Is the patient's home free of loose throw rugs in walkways, pet beds, electrical cords, etc?   yes      Grab bars in the bathroom? yes      Handrails on the stairs?   yes      Adequate lighting?   yes  Patient rating of health (0-10): 9   Depression Screen PHQ 2/9 Scores 10/29/2020 10/29/2020 03/03/2020 10/30/2019  PHQ - 2 Score 0 0 0 0   Cognitive Function MMSE - Mini Mental State  Exam 09/26/2018 03/27/2018 09/06/2017 03/06/2017 11/22/2016  Not completed: (No Data) - - - -  Orientation to time 2 4 3 4 5   Orientation to Place 3 3 4 4 4   Registration 3 3 3 3 3   Attention/ Calculation 2 0 1 4 5   Recall 2 1 1 2 1   Language- name 2 objects 2 2 2 2 2   Language- repeat 1 1 0 1 1  Language- follow 3 step command 3 3 3 3 3   Language- read & follow direction 0 1 1 1 1   Write a sentence 0 1 1 1 1   Copy design 0 0 0 0 1  Total score 18 19 19 25 27    Immunization History  Administered Date(s) Administered  . Influenza Split 10/31/2011, 08/22/2012  . Influenza Whole 10/11/2007, 12/25/2009, 10/01/2010  . Influenza,inj,Quad PF,6+ Mos 09/18/2013, 08/15/2014, 02/03/2016, 09/13/2016, 08/16/2017, 09/10/2018, 09/17/2019, 10/29/2020  . PFIZER SARS-COV-2 Vaccination 07/15/2020, 08/05/2020  . Pneumococcal Conjugate-13 02/04/2015  . Pneumococcal Polysaccharide-23 04/16/2009  . Td 11/20/2008   Screening Tests Health Maintenance  Topic Date Due  . DTAP VACCINES (1) 08/21/1941  . DTaP/Tdap/Td (2 - Tdap) 11/20/2018  . TETANUS/TDAP  11/20/2018  . INFLUENZA VACCINE  Completed  . COVID-19 Vaccine  Completed  . Hepatitis C Screening  Completed  . PNA vac Low Risk Adult  Completed   Cancer Screenings: Lung: Low Dose CT Chest recommended if Age 73-80 years, 30 pack-year currently smoking OR have quit w/in 15years. Patient does not qualify. Colorectal: NA  Additional Screenings: Hepatitis Screen: Completed  HIV Screen: Completed   Plan:  Flu Vaccine given today. Apt with PCP scheduled 11/11/2020 @11pm . Bring your daughter and your medications! Fill out the advance directive packet.  Stay active!  I have personally reviewed and noted the following in the patient's chart:   . Medical and social history . Use  of alcohol, tobacco or illicit drugs  . Current medications and supplements . Functional ability and status . Nutritional status . Physical activity . Advanced  directives . List of other physicians . Hospitalizations, surgeries, and ER visits in previous 12 months . Vitals . Screenings to include cognitive, depression, and falls . Referrals and appointments  In addition, I have reviewed and discussed with patient certain preventive protocols, quality metrics, and best practice recommendations. A written personalized care plan for preventive services as well as general preventive health recommendations were provided to patient.  Steva Colder, CMA  10/29/2020  I reviewed this AWV and agree with above

## 2020-11-04 ENCOUNTER — Other Ambulatory Visit: Payer: Self-pay | Admitting: Cardiovascular Disease

## 2020-11-11 ENCOUNTER — Ambulatory Visit (INDEPENDENT_AMBULATORY_CARE_PROVIDER_SITE_OTHER): Payer: Medicare HMO | Admitting: Family Medicine

## 2020-11-11 ENCOUNTER — Encounter: Payer: Self-pay | Admitting: Family Medicine

## 2020-11-11 ENCOUNTER — Other Ambulatory Visit: Payer: Self-pay

## 2020-11-11 DIAGNOSIS — R413 Other amnesia: Secondary | ICD-10-CM | POA: Diagnosis not present

## 2020-11-11 DIAGNOSIS — Z9861 Coronary angioplasty status: Secondary | ICD-10-CM | POA: Diagnosis not present

## 2020-11-11 DIAGNOSIS — H8110 Benign paroxysmal vertigo, unspecified ear: Secondary | ICD-10-CM | POA: Diagnosis not present

## 2020-11-11 DIAGNOSIS — I2510 Atherosclerotic heart disease of native coronary artery without angina pectoris: Secondary | ICD-10-CM | POA: Diagnosis not present

## 2020-11-11 DIAGNOSIS — R0789 Other chest pain: Secondary | ICD-10-CM | POA: Diagnosis not present

## 2020-11-11 DIAGNOSIS — I952 Hypotension due to drugs: Secondary | ICD-10-CM | POA: Diagnosis not present

## 2020-11-11 DIAGNOSIS — E785 Hyperlipidemia, unspecified: Secondary | ICD-10-CM

## 2020-11-11 NOTE — Assessment & Plan Note (Signed)
No current chest pain.  Continue current medications

## 2020-11-11 NOTE — Patient Instructions (Addendum)
Good to see you today!  Thanks for coming in.  Your blood pressure and weight are good  Check your medications at home to make sure they match the list   You should get a Covid booster in Feb 2022  I will call you if your tests are not good.  Otherwise, I will send you a message on MyChart (if it is active) or a letter in the mail..  If you do not hear from me with in 2 weeks please call our office.    Let me know if have any problems  Come back in 6 months for a recheck

## 2020-11-11 NOTE — Assessment & Plan Note (Signed)
Resolved

## 2020-11-11 NOTE — Assessment & Plan Note (Signed)
Seems stable.  He is active and apparently does all his ADLs.  Still drives tractor

## 2020-11-11 NOTE — Assessment & Plan Note (Signed)
Check labs continue pravastatin

## 2020-11-11 NOTE — Progress Notes (Signed)
    SUBJECTIVE:   CHIEF COMPLAINT / HPI:   HYPOTENSION Feels well No lightheadness unless stand up quickly  CAD No chest pain with exertion.  Walks regularly and has been trying to bike some  MEMORY  Thinks it is doing ok.  Not sure of his medications or their names  PERTINENT  PMH / PSH: [place] sp? His daughter lives with him  OBJECTIVE:   BP (!) 100/55   Pulse (!) 55   Ht 5\' 5"  (1.651 m)   Wt 156 lb 6.4 oz (70.9 kg)   SpO2 96%   BMI 26.03 kg/m   Remembers prior episodes of chest pain and dizziness Heart - Regular rate and rhythm.  No murmurs, gallops or rubs.    Lungs:  Normal respiratory effort, chest expands symmetrically. Lungs are clear to auscultation, no crackles or wheezes. Extremities:  No cyanosis, edema, or deformity noted with good range of motion of all major joints.   Skin:  Intact without suspicious lesions or rashes Mobility:able to get up and down from exam table without assistance or distress   ASSESSMENT/PLAN:   BPPV (benign paroxysmal positional vertigo) Resolved.   Chest discomfort No current chest pain.  Continue current medications   Memory loss Seems stable.  He is active and apparently does all his ADLs.  Still drives tractor   Dyslipidemia Check labs continue pravastatin      , MD [facility]

## 2020-11-12 LAB — LIPID PANEL

## 2020-11-16 LAB — CMP14+EGFR

## 2020-11-16 LAB — CBC
Hematocrit: 43.4 % (ref 37.5–51.0)
Hemoglobin: 14.1 g/dL (ref 13.0–17.7)
MCH: 27.6 pg (ref 26.6–33.0)
MCHC: 32.5 g/dL (ref 31.5–35.7)
MCV: 85 fL (ref 79–97)
Platelets: 231 10*3/uL (ref 150–450)
RBC: 5.1 x10E6/uL (ref 4.14–5.80)
RDW: 11.7 % (ref 11.6–15.4)
WBC: 6.7 10*3/uL (ref 3.4–10.8)

## 2020-11-16 LAB — LIPID PANEL

## 2020-12-07 NOTE — Patient Instructions (Addendum)
It was a pleasure to see you today!  1. I suspect you have irritation of your piriformis muscle, a muscle that connects to your hip from your pelvis. I recommend you do some home stretching in the mean time, do not wear your wallet in your left back pocket. Please see the handout for exercises to do in the meantime, but the best course of therapy is to see a physical therapist. I have placed a referral to physical therapy, they will call to schedule you in 1-2 weeks. If you do not receive a phone call, please let us know at [phone] and we can help facilitate that appointment.   2. For pain: in the mean time, I recommend the stretching exercises (see handout), as well as the lidocaine patches, or over the counter creams like salonpas or voltaren gel. You can also try ice.   3. I have placed an order for an x-ray of your pelvis. Please go to the locations on the handout between the hours of 8A-5P M-F to get the x-ray done (likely closed on Friday due to holiday). I will let you know the results when I have them. I recommend you follow up in 2-4 weeks if no improvement, or call us to schedule an appointment if you have worsening pain, numbness, or new symptoms like incontinence.   Be Well,  Dr. Leary Roca

## 2020-12-07 NOTE — Progress Notes (Signed)
    SUBJECTIVE:   CHIEF COMPLAINT / HPI: left hip pain, needs refills  L Hip: Patient reports left hip pain that is lateral and posterior x 1 week. 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 He tried an OTC lidocaine patch, which helped bring relief. He denies any tingling, numbness, shooting pain down his leg, no incontinence or saddle anesthesia. He cannot recall any instances of trauma. He is still able to fully function, no problems walking.  Patient requesting refills of plavix, ASA, and pravastatin (h/o stenting with CAD and NSTEMI), flomax (BPH), and protonix (GERD) for 90 days. Cannot refill namenda, recommend they contact their neurologist or prescribing physician.   PERTINENT  PMH / PSH: CAD s/p stent, NSTEMI, BPH, GERD  OBJECTIVE:   BP 114/64   Pulse (!) 54   Ht 5\' 5"  (1.651 m)   Wt 154 lb 6.4 oz (70 kg)   SpO2 98%   BMI 25.69 kg/m   Nursing note and vitals reviewed GEN: Age-appropriate AAM, resting comfortably in chair, NAD, WNWD HEENT: NCAT. Sclera without injection or icterus. MMM.  Hip: No obvious rash, erythema, ecchymosis, or edema b/l. ROM full in all directions in left hip, limited by pain in right hip ; Strength 5/5 in left hip, but limited in abduction to 3/5 in left hip. FABER negative, FADDIR positive for left hip pain. Pelvic alignment unremarkable to inspection and palpation. Standing hip rotation and gait without trendelenburg / unsteadiness. Greater trochanter with tenderness to palpation on left, not on right. Mild tenderness over piriformis on left, none on right. No SI joint tenderness and normal minimal SI movement. Neuro: Alert and at baseline Ext: no peripheral edema Psych: Pleasant and appropriate   ASSESSMENT/PLAN:   BPH (benign prostatic hyperplasia) Refill current Flomax therapy  Dyslipidemia Refill 90 days, chronic stable.  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  Referral to physical therapy, home exercises given.  Can treat with OTC lidocaine patches or Voltaren gel/Salonpas.  Also recommend ice therapy.  Will obtain plain films of pelvis to ensure no fracture (unlikely but patient does have some memory loss) or other etiology such as OA, also will be better able to assess anatomy for impingement given positive FADDIR. RTC instructions given to patient, see AVS. - referral PT - x-ray pelvis complete     , MD [facility]

## 2020-12-08 ENCOUNTER — Ambulatory Visit (INDEPENDENT_AMBULATORY_CARE_PROVIDER_SITE_OTHER): Payer: Medicare HMO | Admitting: Family Medicine

## 2020-12-08 ENCOUNTER — Other Ambulatory Visit: Payer: Self-pay

## 2020-12-08 VITALS — BP 114/64 | HR 54 | Ht 65.0 in | Wt 154.4 lb

## 2020-12-08 DIAGNOSIS — K219 Gastro-esophageal reflux disease without esophagitis: Secondary | ICD-10-CM

## 2020-12-08 DIAGNOSIS — R350 Frequency of micturition: Secondary | ICD-10-CM | POA: Diagnosis not present

## 2020-12-08 DIAGNOSIS — M25552 Pain in left hip: Secondary | ICD-10-CM

## 2020-12-08 DIAGNOSIS — I2510 Atherosclerotic heart disease of native coronary artery without angina pectoris: Secondary | ICD-10-CM | POA: Diagnosis not present

## 2020-12-08 DIAGNOSIS — I214 Non-ST elevation (NSTEMI) myocardial infarction: Secondary | ICD-10-CM

## 2020-12-08 DIAGNOSIS — E785 Hyperlipidemia, unspecified: Secondary | ICD-10-CM | POA: Diagnosis not present

## 2020-12-08 DIAGNOSIS — Z9861 Coronary angioplasty status: Secondary | ICD-10-CM | POA: Diagnosis not present

## 2020-12-08 DIAGNOSIS — R413 Other amnesia: Secondary | ICD-10-CM

## 2020-12-08 DIAGNOSIS — N400 Enlarged prostate without lower urinary tract symptoms: Secondary | ICD-10-CM

## 2020-12-08 MED ORDER — ASPIRIN 81 MG PO CHEW: 81.0000 mg | CHEWABLE_TABLET | Freq: Every day | ORAL | 3 refills | Status: DC

## 2020-12-08 MED ORDER — PANTOPRAZOLE SODIUM 40 MG PO TBEC: DELAYED_RELEASE_TABLET | ORAL | 1 refills | Status: DC

## 2020-12-08 MED ORDER — CLOPIDOGREL BISULFATE 75 MG PO TABS: ORAL_TABLET | ORAL | 3 refills | Status: DC

## 2020-12-08 MED ORDER — ASPIRIN 81 MG PO CHEW: 81.0000 mg | CHEWABLE_TABLET | Freq: Every day | ORAL | 3 refills | Status: AC

## 2020-12-08 MED ORDER — TAMSULOSIN HCL 0.4 MG PO CAPS: ORAL_CAPSULE | ORAL | 3 refills | Status: DC

## 2020-12-08 MED ORDER — PRAVASTATIN SODIUM 40 MG PO TABS: 40.0000 mg | ORAL_TABLET | Freq: Every day | ORAL | 3 refills | Status: DC

## 2020-12-08 NOTE — Assessment & Plan Note (Signed)
Refill ASA and clopidogrel

## 2020-12-08 NOTE — Assessment & Plan Note (Signed)
Recommend patient and family member follow-up with patient's neurologist who prescribes memantine for refills

## 2020-12-08 NOTE — Assessment & Plan Note (Addendum)
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  Referral to physical therapy, home exercises given.  Can treat with OTC lidocaine patches or Voltaren gel/Salonpas.  Also recommend ice therapy.  Will obtain plain films of pelvis to ensure no fracture (unlikely but patient does have some memory loss) or other etiology such as OA, also will be better able to assess anatomy for impingement given positive FADDIR. RTC instructions given to patient, see AVS. - referral PT - x-ray pelvis complete

## 2020-12-08 NOTE — Assessment & Plan Note (Signed)
Refill 90 days, chronic stable.

## 2020-12-08 NOTE — Assessment & Plan Note (Signed)
Refill current Flomax therapy

## 2021-01-04 ENCOUNTER — Other Ambulatory Visit: Payer: Self-pay | Admitting: Neurology

## 2021-01-11 ENCOUNTER — Telehealth: Payer: Self-pay | Admitting: Neurology

## 2021-01-11 NOTE — Telephone Encounter (Signed)
Pt's daughter came in today that her father needs his prescription (Memantine) to be refilled but the [facility] said it had been closed since it had not been filled since September. Best call back for daughter is [phone].

## 2021-01-11 NOTE — Telephone Encounter (Signed)
Pt last seen in 03/2019 with instructions to only follow up as needed. Courtesy refill provided in 08/2020 with note to schedule appt or request from PCP.   I spoke to his daughter who is going to request the refill from his PCP. If they decline to manage, she will call back to schedule here and we will refill.

## 2021-01-20 ENCOUNTER — Other Ambulatory Visit: Payer: Self-pay

## 2021-01-20 ENCOUNTER — Ambulatory Visit (INDEPENDENT_AMBULATORY_CARE_PROVIDER_SITE_OTHER): Payer: Medicare HMO | Admitting: Family Medicine

## 2021-01-20 ENCOUNTER — Encounter: Payer: Self-pay | Admitting: Family Medicine

## 2021-01-20 DIAGNOSIS — R634 Abnormal weight loss: Secondary | ICD-10-CM | POA: Insufficient documentation

## 2021-01-20 DIAGNOSIS — R413 Other amnesia: Secondary | ICD-10-CM | POA: Diagnosis not present

## 2021-01-20 DIAGNOSIS — N400 Enlarged prostate without lower urinary tract symptoms: Secondary | ICD-10-CM

## 2021-01-20 DIAGNOSIS — E785 Hyperlipidemia, unspecified: Secondary | ICD-10-CM

## 2021-01-20 MED ORDER — MEMANTINE HCL 10 MG PO TABS: ORAL_TABLET | ORAL | 2 refills | Status: DC

## 2021-01-20 NOTE — Assessment & Plan Note (Signed)
Has lost 6 lbs in last year.  No red flags on history or physical.  Seems not to be eating a lot.  Discussed with he and daughter and suggested liberalizing diet.  Check labs.  Will recheck in 3 months

## 2021-01-20 NOTE — Progress Notes (Signed)
    SUBJECTIVE:   CHIEF COMPLAINT / HPI:   He feels well no complaints  Weight Loss Eats some at home and then at restaurant in evenings.  Does not feel he is losing weight.  No fever or nausea and vomiting   Dementia Both he and daughter feel is stable.  No side effects they have noted.  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  No murmurs, gallops or rubs.    Lungs:  Normal respiratory effort, chest expands symmetrically. Lungs are clear to auscultation, no crackles or wheezes. Neck:  No deformities, thyromegaly, masses, or tenderness noted.   Supple with full range of motion without pain.   ASSESSMENT/PLAN:   Memory loss Stable.  Continue memantine that was started by neurology   Weight decrease Has lost 6 lbs in last year.  No red flags on history or physical.  Seems not to be eating a lot.  Discussed with he and daughter and suggested liberalizing diet.  Check labs.  Will recheck in 3 months     Carney Living, MD [facility]

## 2021-01-20 NOTE — Assessment & Plan Note (Signed)
Stable.  Continue memantine that was started by neurology

## 2021-01-20 NOTE — Patient Instructions (Signed)
Good to see you today!  Thanks for coming in.  I will call you if your tests are not good.  Otherwise, I will send you a message on MyChart (if it is active) or a letter in the mail..  If you do not hear from me with in 2 weeks please call our office.   Increase the amount you eat     Come back in 3 months to check on your weight

## 2021-01-21 ENCOUNTER — Encounter: Payer: Self-pay | Admitting: Family Medicine

## 2021-01-21 DIAGNOSIS — E785 Hyperlipidemia, unspecified: Secondary | ICD-10-CM

## 2021-01-21 LAB — CMP14+EGFR
ALT: 6 IU/L (ref 0–44)
AST: 12 IU/L (ref 0–40)
Albumin/Globulin Ratio: 1.7 (ref 1.2–2.2)
Albumin: 4.2 g/dL (ref 3.7–4.7)
Alkaline Phosphatase: 112 IU/L (ref 44–121)
BUN/Creatinine Ratio: 13 (ref 10–24)
BUN: 12 mg/dL (ref 8–27)
Bilirubin Total: 0.3 mg/dL (ref 0.0–1.2)
CO2: 22 mmol/L (ref 20–29)
Calcium: 9.2 mg/dL (ref 8.6–10.2)
Chloride: 103 mmol/L (ref 96–106)
Creatinine, Ser: 0.93 mg/dL (ref 0.76–1.27)
GFR calc Af Amer: 91 mL/min/{1.73_m2} (ref >59)
GFR calc non Af Amer: 79 mL/min/{1.73_m2} (ref >59)
Globulin, Total: 2.5 g/dL (ref 1.5–4.5)
Glucose: 90 mg/dL (ref 65–99)
Potassium: 4 mmol/L (ref 3.5–5.2)
Sodium: 138 mmol/L (ref 134–144)
Total Protein: 6.7 g/dL (ref 6.0–8.5)

## 2021-01-21 LAB — LIPID PANEL
Chol/HDL Ratio: 3.1 ratio (ref 0.0–5.0)
Cholesterol, Total: 185 mg/dL (ref 100–199)
HDL: 59 mg/dL (ref >39)
LDL Chol Calc (NIH): 111 mg/dL — ABNORMAL HIGH (ref 0–99)
Triglycerides: 84 mg/dL (ref 0–149)
VLDL Cholesterol Cal: 15 mg/dL (ref 5–40)

## 2021-03-25 ENCOUNTER — Other Ambulatory Visit: Payer: Self-pay | Admitting: Family Medicine

## 2021-03-25 DIAGNOSIS — K219 Gastro-esophageal reflux disease without esophagitis: Secondary | ICD-10-CM

## 2021-05-25 ENCOUNTER — Ambulatory Visit: Payer: Medicare HMO | Admitting: Family Medicine

## 2021-05-25 NOTE — Progress Notes (Deleted)
    SUBJECTIVE:   CHIEF COMPLAINT / HPI:   Weight   Cholesterol  Shingles Vaccine    PERTINENT  PMH / PSH:   OBJECTIVE:   There were no vitals taken for this visit.  ***  ASSESSMENT/PLAN:   No problem-specific Assessment & Plan notes found for this encounter.     Carney Living, MD [facility]   {    This will disappear when note is signed, click to select method of visit    :1}

## 2021-09-01 ENCOUNTER — Ambulatory Visit (INDEPENDENT_AMBULATORY_CARE_PROVIDER_SITE_OTHER): Payer: Medicare HMO | Admitting: Family Medicine

## 2021-09-01 ENCOUNTER — Ambulatory Visit: Payer: Medicare HMO

## 2021-09-01 ENCOUNTER — Ambulatory Visit (HOSPITAL_COMMUNITY)
Admission: RE | Admit: 2021-09-01 | Discharge: 2021-09-01 | Disposition: A | Discharge status: Home / Self Care | Payer: Medicare HMO | Source: Ambulatory Visit | Attending: Family Medicine | Admitting: Family Medicine

## 2021-09-01 ENCOUNTER — Other Ambulatory Visit: Payer: Self-pay

## 2021-09-01 ENCOUNTER — Encounter: Payer: Self-pay | Admitting: Family Medicine

## 2021-09-01 DIAGNOSIS — R0789 Other chest pain: Secondary | ICD-10-CM | POA: Insufficient documentation

## 2021-09-01 DIAGNOSIS — E785 Hyperlipidemia, unspecified: Secondary | ICD-10-CM

## 2021-09-01 MED ORDER — NITROGLYCERIN 0.4 MG SL SUBL: 0.4000 mg | SUBLINGUAL_TABLET | SUBLINGUAL | 12 refills | Status: DC | PRN

## 2021-09-01 NOTE — Progress Notes (Signed)
    SUBJECTIVE:   CHIEF COMPLAINT / HPI:   CHEST DISCOMFORT  Yesterday afternoon developed burning uncomfortable anterior chest pain that he relates happened some minutes after drinking water.  Continued for several hours while working and when resting.  His daughter called 911 and gave him ASA.  Ambulance came ran ecg and did not transport.  He feels well today.  Had somewhat similar episode several months ago Does not have ntg at home. No recent pain when exerting himself - yard work and biking  Associated Symptoms Shortness of Breath: no Leg swelling: no Recent Immobilization: no Hemoptysis: no Nausea Diaphoresis: no Reflux symptoms: no Fever: no Cough Productive:  no Edema:  no   PMH CAD history: yes - has not seen cardiology for several years Cancer history:  no Pulmonary Emboli history: no  Cholesterol His daughter is putting out his medications and he is taking pravastatin daily    PERTINENT  PMH / PSH: live with daughter who gives him his medications   OBJECTIVE:   BP 131/72   Pulse (!) 58   Wt 152 lb 3.2 oz (69 kg)   SpO2 98%   BMI 25.33 kg/m   Heart - Regular rate and rhythm.  No murmurs, gallops or rubs.    Lungs:  Normal respiratory effort, chest expands symmetrically. Lungs are clear to auscultation, no crackles or wheezes. Extremities:  No cyanosis, edema, or deformity noted with good range of motion of all major joints.    ECG - usual bradycardia - No ST elevations or depressions   ASSESSMENT/PLAN:   Chest discomfort Possible esophageal.  Does not sound cardiac due to hours duration and no change with exertion.  Will prescribe ntg to take if happens again.  Follow up in October as scheduled  Dyslipidemia Will check lab today      Carney Living, MD [facility]

## 2021-09-01 NOTE — Patient Instructions (Addendum)
Good to see you today - Thank you for coming in  Things we discussed today:  Chest pain  If you have chest pain when working let me know If you have chest pain again that lasts for more than 5-10 minutes try a Nitroglycerin under your tongue - MAKE SURE YOU ARE SITTING DOWN.  Please always bring your medication bottles  Come back to see me in 3 months

## 2021-09-01 NOTE — Assessment & Plan Note (Addendum)
Possible esophageal.  Does not sound cardiac due to hours duration and no change with exertion.  Will prescribe ntg to take if happens again.  Follow up in October as scheduled

## 2021-09-01 NOTE — Progress Notes (Deleted)
     SUBJECTIVE:   CHIEF COMPLAINT / HPI:   Dale [name] is a 80 y.o. male presents for chest pain follow up    ***  Flowsheet Row Office Visit from 12/08/2020 in [facility]  PHQ-9 Total Score 2        Health Maintenance Due  Topic   DTAP VACCINES (1)   Zoster Vaccines- Shingrix (1 of 2)   DTaP/Tdap/Td (2 - Tdap)   TETANUS/TDAP    COVID-19 Vaccine (3 - Booster for Pfizer series)   INFLUENZA VACCINE       PERTINENT  PMH / PSH:   OBJECTIVE:   There were no vitals taken for this visit.   General: Alert, no acute distress Cardio: Normal S1 and S2, RRR, no r/m/g Pulm: CTAB, normal work of breathing Abdomen: Bowel sounds normal. Abdomen soft and non-tender.  Extremities: No peripheral edema.  Neuro: Cranial nerves grossly intact   ASSESSMENT/PLAN:   No problem-specific Assessment & Plan notes found for this encounter.    Towanda Octave, MD PGY-3 [facility]

## 2021-09-01 NOTE — Assessment & Plan Note (Signed)
Will check lab today

## 2021-09-04 LAB — LIPID PANEL

## 2021-09-13 DIAGNOSIS — H2513 Age-related nuclear cataract, bilateral: Secondary | ICD-10-CM | POA: Diagnosis not present

## 2021-09-13 DIAGNOSIS — Z0100 Encounter for examination of eyes and vision without abnormal findings: Secondary | ICD-10-CM | POA: Diagnosis not present

## 2021-09-16 ENCOUNTER — Other Ambulatory Visit: Payer: Self-pay

## 2021-09-16 ENCOUNTER — Encounter: Payer: Self-pay | Admitting: Family Medicine

## 2021-09-16 ENCOUNTER — Ambulatory Visit (INDEPENDENT_AMBULATORY_CARE_PROVIDER_SITE_OTHER): Payer: Medicare HMO | Admitting: Family Medicine

## 2021-09-16 DIAGNOSIS — E785 Hyperlipidemia, unspecified: Secondary | ICD-10-CM | POA: Diagnosis not present

## 2021-09-16 DIAGNOSIS — R0789 Other chest pain: Secondary | ICD-10-CM | POA: Diagnosis not present

## 2021-09-16 DIAGNOSIS — R413 Other amnesia: Secondary | ICD-10-CM | POA: Diagnosis not present

## 2021-09-16 MED ORDER — PRAVASTATIN SODIUM 80 MG PO TABS: 80.0000 mg | ORAL_TABLET | Freq: Every day | ORAL | 3 refills | Status: DC

## 2021-09-16 NOTE — Assessment & Plan Note (Signed)
Worsening.  Gave he and daughter advanced directives packet.  Continue namenda as per neurology.  He has a number of relatives who have had dementia

## 2021-09-16 NOTE — Assessment & Plan Note (Signed)
Possible stable angina vs reflux vs musculoskeletal.  Is not progressing and not interfering with his life.  Will monitor

## 2021-09-16 NOTE — Progress Notes (Signed)
    SUBJECTIVE:   CHIEF COMPLAINT / HPI:   Chest Pain Has intermittently but cant remember exactly when last.  Daughter does not seem to think it interferes with his activity.  Has not used a NTG  Memory He asks why he has problems remembering things.  We dicussed dementia and the medication he is taking and living will EOL wishes.  He has not specified any of these but is interested in more information  PERTINENT  PMH / PSH: living with daughter   OBJECTIVE:   BP (!) 148/75   Pulse (!) 54   Wt 156 lb (70.8 kg)   SpO2 100%   BMI 25.96 kg/m   Alert conversant logical Heart - Regular rate and rhythm.  No murmurs, gallops or rubs.    Lungs:  Normal respiratory effort, chest expands symmetrically. Lungs are clear to auscultation, no crackles or wheezes. Extremities:  No cyanosis, edema, or deformity noted with good range of motion of all major joints.     ASSESSMENT/PLAN:   Memory loss Worsening.  Gave he and daughter advanced directives packet.  Continue namenda as per neurology.  He has a number of relatives who have had dementia   Chest discomfort Possible stable angina vs reflux vs musculoskeletal.  Is not progressing and not interfering with his life.  Will monitor      Carney Living, MD [facility]

## 2021-09-16 NOTE — Patient Instructions (Signed)
Good to see you today - Thank you for coming in  Things we discussed today:  Review all medications I sent a new prescription for Pravastatin 80 mg once a day  Review the Living Will forms and fill out if youd like Bring a copy for me   Please always bring your medication bottles  Come back to see me in 3 months

## 2021-09-22 ENCOUNTER — Other Ambulatory Visit: Payer: Self-pay | Admitting: *Deleted

## 2021-09-22 MED ORDER — MEMANTINE HCL 10 MG PO TABS: ORAL_TABLET | ORAL | 2 refills | Status: DC

## 2021-10-01 ENCOUNTER — Ambulatory Visit (INDEPENDENT_AMBULATORY_CARE_PROVIDER_SITE_OTHER): Payer: Medicare HMO | Admitting: Family Medicine

## 2021-10-01 ENCOUNTER — Other Ambulatory Visit: Payer: Self-pay

## 2021-10-01 VITALS — BP 134/68 | HR 52 | Ht 65.0 in | Wt 158.1 lb

## 2021-10-01 DIAGNOSIS — R413 Other amnesia: Secondary | ICD-10-CM

## 2021-10-01 NOTE — Assessment & Plan Note (Signed)
Known dementia followed by neurology.  He is concerned about worsening memory though daughter feels symptoms are stable.  Reassurance provided that he is on maximum dose of memantine.  Counseled on healthy diet, exercise, and staying social.

## 2021-10-01 NOTE — Patient Instructions (Addendum)
It was nice seeing you today!  Make sure to take your medications.  Keep eating healthy, exercising, and staying social to help prevent further memory issues.  Stay well, Littie Deeds, MD [facility] [phone]

## 2021-10-01 NOTE — Progress Notes (Signed)
    SUBJECTIVE:   CHIEF COMPLAINT / HPI:   Patient has been noted to have memory loss since 2015.  He was last evaluated by neurology in April 2020.  He was continued on memantine 10 mg twice daily at that time.  He has previously been on donepezil but was discontinued due to bradycardia.  Patient is accompanied by his daughter today.  Reason for visit today is due to patient concern about worsening memory issues lately such as forgetting where he has put his keys.  Daughter lives with him and has not noticed any significant change in his memory.  He sometimes forgets to take his medications.  Otherwise he is fairly independent.  He has a family history of dementia in multiple family members.  PERTINENT  PMH / PSH: CAD, BPH, GERD, dementia  OBJECTIVE:   BP 134/68   Pulse (!) 52   Ht 5\' 5"  (1.651 m)   Wt 158 lb 2 oz (71.7 kg)   SpO2 100%   BMI 26.31 kg/m   General: Elderly male, NAD CV: Bradycardic, regular rhythm, no murmurs Pulm: CTAB, no wheezes or rales Neuro: Alert and interactive, follows commands  ASSESSMENT/PLAN:   Memory loss Known dementia followed by neurology.  He is concerned about worsening memory though daughter feels symptoms are stable.  Reassurance provided that he is on maximum dose of memantine.  Counseled on healthy diet, exercise, and staying social.     , MD [facility]

## 2021-12-15 ENCOUNTER — Other Ambulatory Visit: Payer: Self-pay | Admitting: Family Medicine

## 2021-12-15 DIAGNOSIS — I2510 Atherosclerotic heart disease of native coronary artery without angina pectoris: Secondary | ICD-10-CM

## 2021-12-15 DIAGNOSIS — N400 Enlarged prostate without lower urinary tract symptoms: Secondary | ICD-10-CM

## 2021-12-15 DIAGNOSIS — R350 Frequency of micturition: Secondary | ICD-10-CM

## 2021-12-15 DIAGNOSIS — Z9861 Coronary angioplasty status: Secondary | ICD-10-CM

## 2022-01-04 DIAGNOSIS — E663 Overweight: Secondary | ICD-10-CM | POA: Diagnosis not present

## 2022-01-04 DIAGNOSIS — Z6826 Body mass index (BMI) 26.0-26.9, adult: Secondary | ICD-10-CM | POA: Diagnosis not present

## 2022-01-04 DIAGNOSIS — Z0000 Encounter for general adult medical examination without abnormal findings: Secondary | ICD-10-CM | POA: Diagnosis not present

## 2022-03-23 ENCOUNTER — Telehealth: Payer: Self-pay

## 2022-03-23 NOTE — Telephone Encounter (Signed)
Daughter calls nurse line reporting her patient called her for a nitroglycerin refill. ? ?Daughter reports he stated he was having "chest discomfort" earlier this morning. Daughter reports she has been at work and could not call for refill. ? ?I asked daughter about his symptoms. She reported, "I do not know I am on my way to his house now."  ? ?Daughter advised to take him to the ED if he is still having chest pains, SOB, nausea/vomiting, fatigue or shoulder/arm pain.  ? ?Daughter agreed with plan.  ? ?Apt scheduled for tomorrow for evaluation.  ?

## 2022-03-24 ENCOUNTER — Ambulatory Visit (INDEPENDENT_AMBULATORY_CARE_PROVIDER_SITE_OTHER): Payer: Medicare HMO | Admitting: Family Medicine

## 2022-03-24 ENCOUNTER — Other Ambulatory Visit: Payer: Self-pay

## 2022-03-24 VITALS — BP 131/68 | HR 67 | Wt 161.8 lb

## 2022-03-24 DIAGNOSIS — R001 Bradycardia, unspecified: Secondary | ICD-10-CM

## 2022-03-24 DIAGNOSIS — E785 Hyperlipidemia, unspecified: Secondary | ICD-10-CM

## 2022-03-24 DIAGNOSIS — R0789 Other chest pain: Secondary | ICD-10-CM | POA: Diagnosis not present

## 2022-03-24 NOTE — Progress Notes (Signed)
? ? ?  SUBJECTIVE:  ? ?CHIEF COMPLAINT / HPI:  ? ?Chest discomfort ?Centralized chest discomfort that started yesterday morning.  Lasted 15-20 minutes.  Associated symptoms of pressure.  Discomfort felt better with spitting.  He states that it felt as if something Coming up his chest and throat.  His daughter is present with him today and states he called her.  When she arrived at his home she states that he looked fine.  Denies chest pain, shortness of breath, sweating, arm pain, back pain. ? ?PERTINENT  PMH / PSH: NSTEMI status post stent placement 2018, bradycardia, HLD ? ?OBJECTIVE:  ? ?Pulse 67   Wt 161 lb 12.8 oz (73.4 kg)   SpO2 98%   BMI 26.92 kg/m?  ?EKG: unchanged from previous tracings, sinus bradycardia, 1st degree AV block. ?Physical Exam ?Vitals reviewed.  ?Constitutional:   ?   General: He is not in acute distress. ?   Appearance: He is normal weight. He is not ill-appearing, toxic-appearing or diaphoretic.  ?Cardiovascular:  ?   Rate and Rhythm: Regular rhythm. Bradycardia present.  ?   Heart sounds: Normal heart sounds.  ?Pulmonary:  ?   Effort: Pulmonary effort is normal.  ?   Breath sounds: Normal breath sounds.  ?Musculoskeletal:  ?   Right lower leg: No edema.  ?   Left lower leg: No edema.  ?Neurological:  ?   Mental Status: He is alert and oriented to person, place, and time.  ?Psychiatric:     ?   Mood and Affect: Mood normal.     ?   Behavior: Behavior normal.  ? ?ASSESSMENT/PLAN:  ? ?Chest discomfort  Bradycardia  Hx of NSTEMI 2018 ?Chest discomfort that started yesterday.  Self resolved within 15 to 20 minutes.  Described as if something in chest coming up.  Symptoms described sound similar to acid reflux.  Asymptomatic today.  Vital stable and appears well.  Reviewed cardiology office visit 2020, no medication changes at that time and was suggested to return in a year.  Reviewed prior EKG with bradycardia and first-degree AV block, today it is grossly unchanged from last.  Discussed  with PCP and was informed that patient has a history of nonspecific chest discomfort.  Being that vitals are stable and EKG is unchanged and he is without symptoms today encouraged follow-up with cardiology.  Office number given to schedule appointment.  Stressed importance to daughter.  Encouraged to follow-up with PCP if symptoms continues or increases in frequency.  Will obtain direct LDL as below.  PCP, daughter, patient agreeable to plan. ? ?Dyslipidemia ?- LDL Cholesterol, Direct ? ? ?Trenika Hudson Autry-Lott, DO ?[facility]  ?

## 2022-03-24 NOTE — Patient Instructions (Signed)
It was wonderful to see you today. ? ?Please bring ALL of your medications with you to every visit.  ? ?Today we talked about: ?Chest discomfort.  This may or may not be related to your diet considering her acid reflux.  You can attempt to eat less acidic foods and sit up for 30 minutes after eating to minimize discomfort.  I would also like for you to follow-up with your cardiologist.  I have provided the name, location, and number below.  If you continue to have these issues please follow-up with your PCP. ? ?[facility] ?[address] ?[address] ?[place], [place] [address] ?[phone] ? ?Please be sure to schedule follow up at the front  desk before you leave today.  ? ?If you haven't already, sign up for My Chart to have easy access to your labs results, and communication with your primary care physician. ? ?Please call the clinic at [phone] if your symptoms worsen or you have any concerns. It was our pleasure to serve you. ? ?Dr. Salvadore Dom ? ?

## 2022-03-25 LAB — LDL CHOLESTEROL, DIRECT: LDL Direct: 70 mg/dL (ref 0–99)

## 2022-03-30 ENCOUNTER — Encounter: Payer: Self-pay | Admitting: Family Medicine

## 2022-05-17 ENCOUNTER — Encounter: Payer: Self-pay | Admitting: *Deleted

## 2022-08-26 ENCOUNTER — Other Ambulatory Visit: Payer: Self-pay

## 2022-08-26 DIAGNOSIS — K219 Gastro-esophageal reflux disease without esophagitis: Secondary | ICD-10-CM

## 2022-08-29 MED ORDER — PANTOPRAZOLE SODIUM 40 MG PO TBEC: DELAYED_RELEASE_TABLET | ORAL | 1 refills | Status: AC

## 2022-08-31 ENCOUNTER — Encounter: Payer: Self-pay | Admitting: Family Medicine

## 2022-08-31 ENCOUNTER — Other Ambulatory Visit: Payer: Self-pay

## 2022-08-31 ENCOUNTER — Ambulatory Visit (INDEPENDENT_AMBULATORY_CARE_PROVIDER_SITE_OTHER): Payer: Medicare HMO | Admitting: Family Medicine

## 2022-08-31 DIAGNOSIS — R413 Other amnesia: Secondary | ICD-10-CM

## 2022-08-31 MED ORDER — DONEPEZIL HCL 5 MG PO TABS: 2.5000 mg | ORAL_TABLET | Freq: Every day | ORAL | 1 refills | Status: DC

## 2022-08-31 NOTE — Patient Instructions (Signed)
Good to see you today - Thank you for coming in  Things we discussed today:  Memory Try Aricept 1/2 tab at bed time Check his pulse and blood pressure before the medicine and when he wakes up  Let me know if his blood pressure is < 120/60 or pulse is less than 55 or if he feels dizzy or lightheadness   You should complete a  Health Care Power of Attorney and A Power of Attorney   Please always bring your medication bottles  Come back to see me in 1 month

## 2022-08-31 NOTE — Assessment & Plan Note (Addendum)
Not controlled.  Consistent with Alz dementia. Will try a very low dose of Aricept per his wishes since he had possible bradycardia due to aricept in past.  Cautioned on monitoring his pulse.  We also reviewed the diagnosis and prognosis and HCPOA and POA.  Should have EOL discussion at subsequent visit

## 2022-08-31 NOTE — Progress Notes (Addendum)
    SUBJECTIVE:   CHIEF COMPLAINT / HPI:    Memory Loss He feels his memory loss is getting worse and that it ws better when he was on Aricept and would like to try again.  Memory is mostly forgetting to do things - lock garage etc.  Has not been getting lost but usually has someone with him. Lives with his daughter and 79 yo granddtr  Noted to have memory loss since 2015.  He was last evaluated by neurology in April 2020.  He was continued on memantine 10 mg twice daily at that time.  He has previously been on donepezil but was discontinued due to bradycardia.   Patient is accompanied by his daughter today.  They use a pill box Did not bring in his medications   PERTINENT  PMH / PSH: CAD  OBJECTIVE:   BP (!) 149/67   Pulse 69   Wt 156 lb 9.6 oz (71 kg)   SpO2 98%   BMI 26.06 kg/m   Alert nad Quiet most of the time but will respond appropriately when asked Heart - Regular rate and rhythm.  No murmurs, gallops or rubs.    Lungs:  Normal respiratory effort, chest expands symmetrically. Lungs are clear to auscultation, no crackles or wheezes. No edema   ASSESSMENT/PLAN:   Memory loss Not controlled.  Consistent with Alz dementia. Will try a very low dose of Aricept per his wishes since he had possible bradycardia due to aricept in past.  Cautioned on monitoring his pulse.  We also reviewed the diagnosis and prognosis and HCPOA and POA.  Should have EOL discussion at subsequent visit    Patient Instructions  Good to see you today - Thank you for coming in  Things we discussed today:  Memory Try Aricept 1/2 tab at bed time Check his pulse and blood pressure before the medicine and when he wakes up  Let me know if his blood pressure is < 120/60 or pulse is less than 55 or if he feels dizzy or lightheadness   You should complete a  Dinosaur and A Power of Attorney   Please always bring your medication bottles  Come back to see me in 1  month   Lind Covert, MD [place]

## 2022-10-18 ENCOUNTER — Other Ambulatory Visit: Payer: Self-pay

## 2022-10-19 MED ORDER — PRAVASTATIN SODIUM 80 MG PO TABS: 80.0000 mg | ORAL_TABLET | Freq: Every day | ORAL | 3 refills | Status: DC

## 2022-11-25 ENCOUNTER — Other Ambulatory Visit: Payer: Self-pay

## 2022-11-27 ENCOUNTER — Other Ambulatory Visit: Payer: Self-pay | Admitting: Family Medicine

## 2022-11-27 DIAGNOSIS — R350 Frequency of micturition: Secondary | ICD-10-CM

## 2022-11-27 DIAGNOSIS — N400 Enlarged prostate without lower urinary tract symptoms: Secondary | ICD-10-CM

## 2022-11-27 DIAGNOSIS — I2510 Atherosclerotic heart disease of native coronary artery without angina pectoris: Secondary | ICD-10-CM

## 2022-11-28 MED ORDER — MEMANTINE HCL 10 MG PO TABS: ORAL_TABLET | ORAL | 2 refills | Status: DC

## 2023-01-14 ENCOUNTER — Emergency Department (HOSPITAL_COMMUNITY): Payer: Medicare HMO

## 2023-01-14 ENCOUNTER — Other Ambulatory Visit: Payer: Self-pay

## 2023-01-14 ENCOUNTER — Encounter (HOSPITAL_COMMUNITY): Payer: Self-pay | Admitting: Family Medicine

## 2023-01-14 ENCOUNTER — Other Ambulatory Visit: Payer: Self-pay | Admitting: Cardiology

## 2023-01-14 ENCOUNTER — Observation Stay (HOSPITAL_COMMUNITY)
Admission: EM | Admit: 2023-01-14 | Discharge: 2023-01-14 | Disposition: A | Discharge status: Home / Self Care | Payer: Medicare HMO | Attending: Family Medicine | Admitting: Family Medicine

## 2023-01-14 DIAGNOSIS — I2510 Atherosclerotic heart disease of native coronary artery without angina pectoris: Secondary | ICD-10-CM | POA: Insufficient documentation

## 2023-01-14 DIAGNOSIS — Z87891 Personal history of nicotine dependence: Secondary | ICD-10-CM | POA: Insufficient documentation

## 2023-01-14 DIAGNOSIS — I209 Angina pectoris, unspecified: Secondary | ICD-10-CM

## 2023-01-14 DIAGNOSIS — R072 Precordial pain: Principal | ICD-10-CM | POA: Insufficient documentation

## 2023-01-14 DIAGNOSIS — Z7902 Long term (current) use of antithrombotics/antiplatelets: Secondary | ICD-10-CM | POA: Diagnosis not present

## 2023-01-14 DIAGNOSIS — R0789 Other chest pain: Secondary | ICD-10-CM | POA: Diagnosis not present

## 2023-01-14 DIAGNOSIS — R079 Chest pain, unspecified: Secondary | ICD-10-CM | POA: Diagnosis present

## 2023-01-14 DIAGNOSIS — Z955 Presence of coronary angioplasty implant and graft: Secondary | ICD-10-CM | POA: Insufficient documentation

## 2023-01-14 DIAGNOSIS — Z7982 Long term (current) use of aspirin: Secondary | ICD-10-CM | POA: Insufficient documentation

## 2023-01-14 DIAGNOSIS — F0390 Unspecified dementia without behavioral disturbance: Secondary | ICD-10-CM | POA: Insufficient documentation

## 2023-01-14 DIAGNOSIS — I10 Essential (primary) hypertension: Secondary | ICD-10-CM | POA: Diagnosis not present

## 2023-01-14 DIAGNOSIS — Z79899 Other long term (current) drug therapy: Secondary | ICD-10-CM | POA: Insufficient documentation

## 2023-01-14 LAB — COMPREHENSIVE METABOLIC PANEL
ALT: 11 U/L (ref 0–44)
AST: 18 U/L (ref 15–41)
Albumin: 3.4 g/dL — ABNORMAL LOW (ref 3.5–5.0)
Alkaline Phosphatase: 80 U/L (ref 38–126)
Anion gap: 6 (ref 5–15)
BUN: 13 mg/dL (ref 8–23)
CO2: 27 mmol/L (ref 22–32)
Calcium: 8.8 mg/dL — ABNORMAL LOW (ref 8.9–10.3)
Chloride: 105 mmol/L (ref 98–111)
Creatinine, Ser: 1.16 mg/dL (ref 0.61–1.24)
GFR, Estimated: >60 mL/min (ref >60)
Glucose, Bld: 117 mg/dL — ABNORMAL HIGH (ref 70–99)
Potassium: 3.7 mmol/L (ref 3.5–5.1)
Sodium: 138 mmol/L (ref 135–145)
Total Bilirubin: 0.6 mg/dL (ref 0.3–1.2)
Total Protein: 6.3 g/dL — ABNORMAL LOW (ref 6.5–8.1)

## 2023-01-14 LAB — CBC WITH DIFFERENTIAL/PLATELET
Abs Immature Granulocytes: 0.01 10*3/uL (ref 0.00–0.07)
Basophils Absolute: 0 10*3/uL (ref 0.0–0.1)
Basophils Relative: 1 %
Eosinophils Absolute: 0.2 10*3/uL (ref 0.0–0.5)
Eosinophils Relative: 2 %
HCT: 38.6 % — ABNORMAL LOW (ref 39.0–52.0)
Hemoglobin: 12 g/dL — ABNORMAL LOW (ref 13.0–17.0)
Immature Granulocytes: 0 %
Lymphocytes Relative: 35 %
Lymphs Abs: 2.2 10*3/uL (ref 0.7–4.0)
MCH: 27.8 pg (ref 26.0–34.0)
MCHC: 31.1 g/dL (ref 30.0–36.0)
MCV: 89.4 fL (ref 80.0–100.0)
Monocytes Absolute: 0.5 10*3/uL (ref 0.1–1.0)
Monocytes Relative: 9 %
Neutro Abs: 3.3 10*3/uL (ref 1.7–7.7)
Neutrophils Relative %: 53 %
Platelets: 218 10*3/uL (ref 150–400)
RBC: 4.32 MIL/uL (ref 4.22–5.81)
RDW: 12.3 % (ref 11.5–15.5)
WBC: 6.3 10*3/uL (ref 4.0–10.5)
nRBC: 0 % (ref 0.0–0.2)

## 2023-01-14 LAB — TROPONIN I (HIGH SENSITIVITY)
Troponin I (High Sensitivity): 6 ng/L (ref <18)
Troponin I (High Sensitivity): 4 ng/L (ref <18)

## 2023-01-14 MED ORDER — HEPARIN SODIUM (PORCINE) 5000 UNIT/ML IJ SOLN
5000.0000 [IU] | Freq: Three times a day (TID) | INTRAMUSCULAR | Status: DC
  Administered 2023-01-14 (×2): 5000 [IU] via SUBCUTANEOUS
  Filled 2023-01-14 (×2): qty 1

## 2023-01-14 MED ORDER — ACETAMINOPHEN 325 MG PO TABS: 650.0000 mg | ORAL_TABLET | Freq: Four times a day (QID) | ORAL | Status: DC | PRN

## 2023-01-14 MED ORDER — ACETAMINOPHEN 650 MG RE SUPP: 650.0000 mg | Freq: Four times a day (QID) | RECTAL | Status: DC | PRN

## 2023-01-14 MED ORDER — ISOSORBIDE MONONITRATE ER 60 MG PO TB24: 60.0000 mg | ORAL_TABLET | Freq: Every day | ORAL | 0 refills | Status: DC

## 2023-01-14 MED ORDER — ISOSORBIDE MONONITRATE ER 60 MG PO TB24: 60.0000 mg | ORAL_TABLET | Freq: Every day | ORAL | Status: DC

## 2023-01-14 MED ORDER — ALUM & MAG HYDROXIDE-SIMETH 200-200-20 MG/5ML PO SUSP
30.0000 mL | Freq: Once | ORAL | Status: AC
  Administered 2023-01-14: 30 mL via ORAL
  Filled 2023-01-14: qty 30

## 2023-01-14 MED ORDER — ORAL CARE MOUTH RINSE: 15.0000 mL | OROMUCOSAL | Status: DC | PRN

## 2023-01-14 NOTE — H&P (Addendum)
Hospital Admission History and Physical Service Pager: [phone]  [name] Medical record number: [id number] Date of Birth: 1941-06-08 Age: 82 y.o. Gender: male  Primary Care Provider: Lind Covert, MD Consultants: Cardiology Code Status: Full Preferred Emergency Contact: Harrison Mons (daughter)  Chief Complaint: chest pain  Assessment and Plan: Dale [name] is a 82 y.o. male presenting with chest pain . Differential for this patient's presentation of this includes angina, GERD, PE, MSK pain. Angina is most likely given pt's cardiac hx of prior MI (2018), improvement of pain w/ nitro, and non-reproducible w/ palpation. Reassuringly, trops wnl and EKG w/o ST changes. GERD is considered given location of pain and hx of GERD, but abm nontender. PE is considered but BL LE appear symmetric, VSS, and resp status wnl.   Chest pain Pt presents w/ L-sided chest pain that started last night. Pain improved w/ nitro and ASA. Plan to admit for further workup and Cardiology eval.  - Admit to [facility] Med Surg w/ attending Dr. Ardelia Mems - s/p 325mg  ASA  - s/p Maalox in ED - Cardiology consulted in ED. Appreciate recs. - Vitals per unit protocol - NPO in case of potential Cardiology procedure.   Chronic Conditions (Waiting to restart meds pending Med Rec. Pt and daughter unable to confirm med list at this time) BPH: takes Flomax CAD: has both ASA and plavix listed (clarify if he is on both). Nitro prn. Pravastatin nightly. Dementia: on Namenda, fully oriented at baseline  FEN/GI: NPO in case of potential cardiac procedure VTE Prophylaxis: SQ Heparin  Disposition: Med Surg  History of Present Illness:  Dale [name] is a 82 y.o. male presenting with chest pain. Chest pain started last night when he was driving home. Pain is Left side of chest, under nipple. Pt has difficulty describing characteristic of pain. Does not feel like pain he had w/ past MI or GERD. Does not  worsen w/ exertion. Pt tried taking GasX and it did not help. EMS was called and daughter was instructed to administer 4 baby aspirin. Pain has improved since going to ED.  In the ED, VSS on admission and EKG unremarkable. Trop neg. Cards consulted and want admission for potential stress test.   Pertinent Past Medical History: Dementia GERD BPH  CAD s/p stent  Remainder reviewed in history tab.   Pertinent Past Surgical History: stent to LCX 2018  Remainder reviewed in history tab.  Pertinent Social History: Tobacco use: Former (quit 13 years ago) Alcohol use: None Other Substance use: Denies Lives with daughter and granddaughter  Pertinent Family History: Mother: cancer Father: MI, alzheimers  Remainder reviewed in history tab.   Important Outpatient Medications: ASA Protonix Flomax Pravastatin  Remainder reviewed in medication history.   Objective: BP (!) 155/81   Pulse (!) 54   Temp 98.3 F (36.8 C) (Oral)   Resp 17   Ht 5\' 5"  (1.651 m)   Wt 72.6 kg   SpO2 99%   BMI 26.63 kg/m  Exam: General: Pleasant older man laying comfortably in bed. NAD. Alert and grossly oriented. Responding appropriately in full sentences. HEENT: NCAT. MMM. Cardiovascular: RRR, no murmurs Respiratory: CTAB, no wheezing or crackles. Gastrointestinal: Soft, nontender, nondistended. Normal BS. MSK: No chest tenderness to palpation. Ext: Trace pitting edema BL.  Labs:  CBC BMET  Recent Labs  Lab 01/14/23 0141  WBC 6.3  HGB 12.0*  HCT 38.6*  PLT 218   Recent Labs  Lab 01/14/23 0141  NA 138  K 3.7  CL 105  CO2 27  BUN 13  CREATININE 1.16  GLUCOSE 117*  CALCIUM 8.8*     Trop 6 > 4  EKG: My own interpretation (not copied from electronic read) NSR, narrow QRS complexes, normal intervals, no ST changes.     Imaging Studies Performed:  CXR Impression from Radiologist: No active cardiopulmonary disease.    My Interpretation: unremarkable   Arlyce Dice,  MD 01/14/2023, 5:58 AM PGY-1, [place] Intern pager: [phone], text pages welcome Secure chat group [place] Upper-Level Resident Addendum   I have independently interviewed and examined the patient. I have discussed the above with Dr. Markus Jarvis and agree with the documented plan. My edits for correction/addition/clarification are included above. Please see any attending notes.   Alcus Dad, MD PGY-3, [place] [facility] 01/14/2023 7:55 AM  FPTS Service pager: [phone] (text pages welcome through [facility])

## 2023-01-14 NOTE — Discharge Instructions (Addendum)
Dear Amie Critchley,   Thank you so much for allowing [place] to be part of your care!  You were admitted to [facility] for chest pain. Cardiology saw you and believes this was not a heart attack or anything that needs intervention.   POST-HOSPITAL & CARE INSTRUCTIONS Stop plavix per cardiology Start Imdur  Please let PCP/Specialists know of any changes that were made.  Please see medications section of this packet for any medication changes.   DOCTOR'S APPOINTMENT & FOLLOW UP CARE INSTRUCTIONS  Future Appointments  Date Time Provider [place]  02/06/2023  2:20 PM Lenna Sciara, NP [facility] None    RETURN PRECAUTIONS: Worsening chest pain, shortness of breath  Take care and be well!  [facility]  [place], [place] [address] [phone]

## 2023-01-14 NOTE — Discharge Summary (Addendum)
[facility] Discharge Summary  [name] Medical record number: [id number] Date of birth: 1941-03-31 Age: 82 y.o. Gender: male Date of Admission: 01/14/2023  Date of Discharge: 01/14/2022 Admitting Physician: Arlyce Dice, MD  Primary Care Provider: Lind Covert, MD Consultants: Cardiology  Indication for Hospitalization: Chest pain, ACS rule out  Discharge Diagnoses/Problem List:   Chest pain Patient presented with left-sided chest pain that started night prior to admission.  In the ED had substernal chest pain concerning for ACS.  EKG without acute ischemic changes and troponin trend negative.  Patient's chest pain resolved on admission with no recurrence.  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  They recommended repeat SPECT outpatient and follow-up with cardiology outpatient.  He started Imdur in patient.  They recommended continuing aspirin and stopping Plavix as PCI was greater than 6 months ago.  May do nitro sublingually as needed and continue pravastatin.   Disposition: Home  Discharge Condition: Stable  Discharge Exam:  Per Dr. Astrid Drafts exam: General: Pleasant older man laying comfortably in bed. NAD. Alert and grossly oriented. Responding appropriately in full sentences. HEENT: NCAT. MMM. Cardiovascular: RRR, no murmurs Respiratory: CTAB, no wheezing or crackles. Gastrointestinal: Soft, nontender, nondistended. Normal BS. MSK: No chest tenderness to palpation. Ext: Trace pitting edema BL.   Issues for Follow Up:  1. Ensure plavix stopped 2.  Ensure continuing Imdur 3.  Ensure follow up with cardiology/outpatient SPECT  Significant Procedures: None  Significant Labs and Imaging:  Recent Labs  Lab 01/14/23 0141  WBC 6.3  HGB 12.0*  HCT 38.6*  PLT 218   Recent Labs  Lab 01/14/23 0141  NA 138  K 3.7  CL 105  CO2 27  GLUCOSE 117*  BUN 13   CREATININE 1.16  CALCIUM 8.8*  ALKPHOS 80  AST 18  ALT 11  ALBUMIN 3.4*    Results/Tests Pending at Time of Discharge:   Discharge Medications:  Allergies as of 01/14/2023   No Known Allergies      Medication List     STOP taking these medications    clopidogrel 75 MG tablet Commonly known as: PLAVIX       TAKE these medications    aspirin 81 MG chewable tablet Chew 1 tablet (81 mg total) by mouth daily.   donepezil 5 MG tablet Commonly known as: Aricept Take 0.5 tablets (2.5 mg total) by mouth at bedtime.   isosorbide mononitrate 60 MG 24 hr tablet Commonly known as: IMDUR Take 1 tablet (60 mg total) by mouth daily.   loratadine 10 MG tablet Commonly known as: CLARITIN Take 10 mg by mouth daily as needed for allergies.   memantine 10 MG tablet Commonly known as: NAMENDA Take 1 tab twice daily. What changed:  how much to take how to take this when to take this additional instructions   nitroGLYCERIN 0.4 MG SL tablet Commonly known as: NITROSTAT Place 1 tablet (0.4 mg total) under the tongue every 5 (five) minutes x 3 doses as needed for chest pain.   pantoprazole 40 MG tablet Commonly known as: PROTONIX Take one daily as needed What changed:  how much to take how to take this when to take this reasons to take this additional instructions   pravastatin 80 MG tablet Commonly known as: PRAVACHOL Take 1 tablet (80 mg total) by mouth at bedtime.   tamsulosin 0.4 MG Caps capsule Commonly known  as: FLOMAX TAKE 1 CAPSULE(0.4 MG) BY MOUTH DAILY AFTER SUPPER What changed:  how much to take how to take this when to take this additional instructions        Discharge Instructions: Please refer to Patient Instructions section of EMR for full details.  Patient was counseled important signs and symptoms that should prompt return to medical care, changes in medications, dietary instructions, activity restrictions, and follow up appointments.    Follow-Up Appointments:  Follow-up Information     Lind Covert, MD Follow up.   Specialty: Family Medicine Why: 9:10 AM Contact information: [place] [address] [place] at [facility] Follow up on 02/06/2023.   Specialty: Cardiology Contact information: [address] [place] [id number] [place] [address] [place]                Gerrit Heck, MD 01/14/2023, 4:56 PM PGY-2, [place]

## 2023-01-14 NOTE — Progress Notes (Signed)
Spoke with Dr. Harl Bowie with cardiology, patient was not on their list today. They will see today and set up for stress test.

## 2023-01-14 NOTE — Assessment & Plan Note (Signed)
Pt presents w/ L-sided chest pain that started last night. Pain improved w/ nitro and ASA. Plan to admit for further workup and Cardiology eval.  - Admit to [facility] Med Surg w/ attending Dr. Ardelia Mems - s/p 325mg  ASA  - s/p Maalox in ED - Cardiology consulted in ED. Appreciate recs. - Vitals per unit protocol - NPO in case of potential Cardiology procedure.

## 2023-01-14 NOTE — ED Triage Notes (Signed)
Pt arrives to ED c/o Substernal left sided CP that has subsided since arrival to ED per EMS. Pt with cardiac hx. CBG 122. Pt self administered 324 of aspirin and 1 sub lingual nitro

## 2023-01-14 NOTE — ED Provider Notes (Signed)
[facility] Provider Note   CSN: [id number] Arrival date & time: 01/14/23  0119     History  Chief Complaint  Patient presents with   Chest Pain    Dale [name] is a 82 y.o. male.  HPI   Patient with medical history including NSTEMI cardiac cath 2019, hyperlipidemia, GERD presents with complaints of left-sided substernal chest pain.  Patient is a started last night, this happened after he got out of his car and was walking to his house, came on suddenly, described as a pressure-like sensation, is intermittent, does not radiate into his jaw or left arm or go into his back, there is no associated shortness of breath, becoming diaphoretic, nausea or vomiting lightheaded or dizziness.  He states that he took aspirin and nitro and now is starting to feel better, patient quit smoking, does not drink alcohol, denies illicit drug use, he has no history of PEs or DVTs currently not on hormone therapy no recent surgeries long immobilizations.  Patient states that he is felt this pain in the past and resolved on its own he has no other complaints.    Home Medications Prior to Admission medications   Medication Sig Start Date End Date Taking? Authorizing Provider  aspirin 81 MG chewable tablet Chew 1 tablet (81 mg total) by mouth daily. 12/08/20  Yes Gladys Damme, MD  clopidogrel (PLAVIX) 75 MG tablet TAKE 1 TABLET(75 MG) BY MOUTH DAILY Patient taking differently: Take 75 mg by mouth daily. 11/28/22  Yes Chambliss, Jeb Levering, MD  donepezil (ARICEPT) 5 MG tablet Take 0.5 tablets (2.5 mg total) by mouth at bedtime. 08/31/22  Yes Lind Covert, MD  loratadine (CLARITIN) 10 MG tablet Take 10 mg by mouth daily as needed for allergies.   Yes [provider]  memantine (NAMENDA) 10 MG tablet Take 1 tab twice daily. Patient taking differently: Take 10 mg by mouth 2 (two) times daily. 11/28/22  Yes Chambliss, Jeb Levering, MD  nitroGLYCERIN  (NITROSTAT) 0.4 MG SL tablet Place 1 tablet (0.4 mg total) under the tongue every 5 (five) minutes x 3 doses as needed for chest pain. 09/01/21  Yes Lind Covert, MD  pantoprazole (PROTONIX) 40 MG tablet Take one daily as needed Patient taking differently: Take 40 mg by mouth daily as needed (for allergies). 08/29/22  Yes Lind Covert, MD  pravastatin (PRAVACHOL) 80 MG tablet Take 1 tablet (80 mg total) by mouth at bedtime. 10/19/22  Yes Chambliss, Jeb Levering, MD  tamsulosin (FLOMAX) 0.4 MG CAPS capsule TAKE 1 CAPSULE(0.4 MG) BY MOUTH DAILY AFTER SUPPER Patient taking differently: Take 0.4 mg by mouth daily after supper. 11/28/22  Yes Lind Covert, MD      Allergies    Patient has no known allergies.    Review of Systems   Review of Systems  Constitutional:  Negative for chills and fever.  Respiratory:  Negative for shortness of breath.   Cardiovascular:  Positive for chest pain.  Gastrointestinal:  Negative for abdominal pain.  Neurological:  Negative for headaches.    Physical Exam Updated Vital Signs BP (!) 146/74   Pulse 61   Temp 98.3 F (36.8 C) (Oral)   Resp 14   Ht 5\' 5"  (1.651 m)   Wt 72.6 kg   SpO2 99%   BMI 26.63 kg/m  Physical Exam Vitals and nursing note reviewed.  Constitutional:      General: He is not in acute distress.  Appearance: He is not ill-appearing.  HENT:     Head: Normocephalic and atraumatic.     Nose: No congestion.  Eyes:     Conjunctiva/sclera: Conjunctivae normal.  Cardiovascular:     Rate and Rhythm: Normal rate and regular rhythm.     Pulses: Normal pulses.     Heart sounds: No murmur heard.    No friction rub. No gallop.  Pulmonary:     Effort: No respiratory distress.     Breath sounds: No wheezing, rhonchi or rales.     Comments: Chest pain is nonreproducible during my examination. Abdominal:     Palpations: Abdomen is soft.     Tenderness: There is no abdominal tenderness. There is no right CVA  tenderness or left CVA tenderness.  Musculoskeletal:     Comments: No unilateral leg swelling no calf tenderness no no palpable cords.  Skin:    General: Skin is warm and dry.  Neurological:     Mental Status: He is alert.  Psychiatric:        Mood and Affect: Mood normal.     ED Results / Procedures / Treatments   Labs (all labs ordered are listed, but only abnormal results are displayed) Labs Reviewed  CBC WITH DIFFERENTIAL/PLATELET - Abnormal; Notable for the following components:      Result Value   Hemoglobin 12.0 (*)    HCT 38.6 (*)    All other components within normal limits  COMPREHENSIVE METABOLIC PANEL - Abnormal; Notable for the following components:   Glucose, Bld 117 (*)    Calcium 8.8 (*)    Total Protein 6.3 (*)    Albumin 3.4 (*)    All other components within normal limits  TROPONIN I (HIGH SENSITIVITY)  TROPONIN I (HIGH SENSITIVITY)    EKG EKG Interpretation  Date/Time:  Saturday January 14 2023 01:30:31 EST Ventricular Rate:  66 PR Interval:  267 QRS Duration: 89 QT Interval:  408 QTC Calculation: 428 R Axis:   30 Text Interpretation: Sinus rhythm Prolonged PR interval No significant change was found Confirmed by Addison Lank [id number]) on 01/14/2023 1:45:36 AM  Radiology DG Chest 2 View  Result Date: 01/14/2023 CLINICAL DATA:  chest pain EXAM: CHEST - 2 VIEW COMPARISON:  Chest x-ray 09/10/2018 FINDINGS: The heart and mediastinal contours are within unchanged. Coronary artery stent. No focal consolidation. No pulmonary edema. No pleural effusion. No pneumothorax. No acute osseous abnormality. IMPRESSION: No active cardiopulmonary disease. Electronically Signed   By: Iven Finn M.D.   On: 01/14/2023 02:35    Procedures Procedures    Medications Ordered in ED Medications  alum & mag hydroxide-simeth (MAALOX/MYLANTA) 200-200-20 MG/5ML suspension 30 mL (30 mLs Oral Given 01/14/23 0143)    ED Course/ Medical Decision Making/ A&P                              Medical Decision Making Amount and/or Complexity of Data Reviewed Labs: ordered. Radiology: ordered.  Risk OTC drugs. Decision regarding hospitalization.   This patient presents to the ED for concern of chest pain, this involves an extensive number of treatment options, and is a complaint that carries with it a high risk of complications and morbidity.  The differential diagnosis includes arrhythmia, PE, ACS, dissection, pneumonia    Additional history obtained:  Additional history obtained from N/A External records from outside source obtained and reviewed including cardiology notes   Co morbidities that complicate the patient  evaluation  NSTEMI, drug-eluting stent  Social Determinants of Health:  N/A    Lab Tests:  I Ordered, and personally interpreted labs.  The pertinent results include: CBC shows normocytic anemia hemoglobin 12, CMP shows a glucose of 117, negative delta troponin   Imaging Studies ordered:  I ordered imaging studies including chest x-ray I independently visualized and interpreted imaging which showed unremarkable I agree with the radiologist interpretation   Cardiac Monitoring:  The patient was maintained on a cardiac monitor.  I personally viewed and interpreted the cardiac monitored which showed an underlying rhythm of: EKG without signs of ischemia   Medicines ordered and prescription drug management:  I ordered medication including GI cocktail I have reviewed the patients home medicines and have made adjustments as needed  Critical Interventions:  N/A   Reevaluation:  Presents with chest pain, he had a benign physical exam, will obtain chest pain workup and reassess.  Patient reassessed, states pain has since resolved, currently has no complaints, will continue to monitor  Patient has negative delta troponin was, but due to his history of he, and story concerning for ischemia, will consult with cardiology for  further recommendations  Spoke with patient in regards to cardiology recommendations agreement this plan will admit to medicine.    Consultations Obtained:  I requested consultation with cardiology fellow Dr. Koleen Nimrod,  and discussed lab and imaging findings as well as pertinent plan - they recommend: Agreed with admission to medicine for stress test and further evaluation. Spoke with Dr. [facility] he will admit the patient    Test Considered:  N/A    Rule out I have low suspicion for acute MI or nstemi EKG without signs of ischemia, patient has negative delta troponins.  Low suspicion for PE as patient denies pleuritic chest pain, shortness of breath, patient denies leg pain, no pedal edema noted on exam, vital signs reassuring nontachypneic nonhypoxic nontachycardic.  Low suspicion for AAA or aortic dissection as history is atypical, patient has low risk factors.  Low suspicion for systemic infection as patient is nontoxic-appearing, vital signs reassuring, no obvious source infection noted on exam.     Dispostion and problem list  After consideration of the diagnostic results and the patients response to treatment, I feel that the patent would benefit from admission.  Chest pain-patient will be admitted for chest pain rule out,benefit from stress test, likely formal consultation by cardiology.            Final Clinical Impression(s) / ED Diagnoses Final diagnoses:  Chest pain, unspecified type    Rx / DC Orders ED Discharge Orders     None         Marcello Fennel, PA-C 01/14/23 0521    Fatima Blank, MD 01/14/23 [phone]

## 2023-01-14 NOTE — ED Notes (Signed)
ED TO INPATIENT HANDOFF REPORT  ED Nurse Name and Phone #: [facility] Mam [phone]  S Name/Age/Gender Dale [name] 82 y.o. male Room/Bed: 038C/038C  Code Status   Code Status: Full Code  Home/SNF/Other Home Patient oriented to: self and place Is this baseline? Yes   Triage Complete: Triage complete  Chief Complaint Chest pain [R07.9]  Triage Note Pt arrives to ED c/o Substernal left sided CP that has subsided since arrival to ED per EMS. Pt with cardiac hx. CBG 122. Pt self administered 324 of aspirin and 1 sub lingual nitro    Allergies No Known Allergies  Level of Care/Admitting Diagnosis ED Disposition     ED Disposition  Admit   Condition  --   Comment  Hospital Area: [facility] [[id number]]  Level of Care: Med-Surg [16]  May place patient in observation at [facility] or [facility] if equivalent level of care is available:: No  Covid Evaluation: Confirmed COVID Negative  Diagnosis: Chest pain [174081]  Admitting Physician: Arlyce Dice [[id number]]  Attending Physician: Leeanne Rio [[id number]]          B Medical/Surgery History Past Medical History:  Diagnosis Date   Coronary artery disease    Dementia ([place])    GERD (gastroesophageal reflux disease)    Hyperlipemia    Leg mass    rt   Memory loss    Myocardial infarction less than 4 weeks ago ([place]) 12/29/2016   Prostate disease    Quit smoking 11/20/2008   Qualifier: Diagnosis of  By: Erin Hearing MD, Wallene Huh angioplasty with stent to LCX    Past Surgical History:  Procedure Laterality Date   CARDIAC CATHETERIZATION N/A 12/30/2016   Procedure: Coronary Stent Intervention;  Surgeon: Lorretta Harp, MD;  Location: [facility];  Service: Cardiovascular;  Laterality: N/A;   CARDIAC CATHETERIZATION N/A 12/30/2016   Procedure: Coronary Angiography;  Surgeon: Lorretta Harp, MD;  Location: [facility];  Service: Cardiovascular;  Laterality: N/A;   CARDIAC  CATHETERIZATION N/A 12/30/2016   Procedure: Coronary Balloon Angioplasty;  Surgeon: Lorretta Harp, MD;  Location: [facility];  Service: Cardiovascular;  Laterality: N/A;   CORONARY STENT PLACEMENT       A IV Location/Drains/Wounds Patient Lines/Drains/Airways Status     Active Line/Drains/Airways     None            Intake/Output Last 24 hours No intake or output data in the 24 hours ending 01/14/23 1241  Labs/Imaging Results for orders placed or performed during the hospital encounter of 01/14/23 (from the past 48 hour(s))  CBC with Differential     Status: Abnormal   Collection Time: 01/14/23  1:41 AM  Result Value Ref Range   WBC 6.3 4.0 - 10.5 K/uL   RBC 4.32 4.22 - 5.81 MIL/uL   Hemoglobin 12.0 (L) 13.0 - 17.0 g/dL   HCT 38.6 (L) 39.0 - 52.0 %   MCV 89.4 80.0 - 100.0 fL   MCH 27.8 26.0 - 34.0 pg   MCHC 31.1 30.0 - 36.0 g/dL   RDW 12.3 11.5 - 15.5 %   Platelets 218 150 - 400 K/uL   nRBC 0.0 0.0 - 0.2 %   Neutrophils Relative % 53 %   Neutro Abs 3.3 1.7 - 7.7 K/uL   Lymphocytes Relative 35 %   Lymphs Abs 2.2 0.7 - 4.0 K/uL   Monocytes Relative 9 %   Monocytes Absolute 0.5  0.1 - 1.0 K/uL   Eosinophils Relative 2 %   Eosinophils Absolute 0.2 0.0 - 0.5 K/uL   Basophils Relative 1 %   Basophils Absolute 0.0 0.0 - 0.1 K/uL   Immature Granulocytes 0 %   Abs Immature Granulocytes 0.01 0.00 - 0.07 K/uL    Comment: Performed at [facility] [address], [place], [place] [address]  Troponin I (High Sensitivity)     Status: None   Collection Time: 01/14/23  1:41 AM  Result Value Ref Range   Troponin I (High Sensitivity) 6 <18 ng/L    Comment: (NOTE) Elevated high sensitivity troponin I (hsTnI) values and significant  changes across serial measurements may suggest ACS but many other  chronic and acute conditions are known to elevate hsTnI results.  Refer to the "Links" section for chest pain algorithms and additional  guidance. Performed at [facility], [place] [address], [place], [place] [address]   Comprehensive metabolic panel     Status: Abnormal   Collection Time: 01/14/23  1:41 AM  Result Value Ref Range   Sodium 138 135 - 145 mmol/L   Potassium 3.7 3.5 - 5.1 mmol/L   Chloride 105 98 - 111 mmol/L   CO2 27 22 - 32 mmol/L   Glucose, Bld 117 (H) 70 - 99 mg/dL    Comment: Glucose reference range applies only to samples taken after fasting for at least 8 hours.   BUN 13 8 - 23 mg/dL   Creatinine, Ser 1.16 0.61 - 1.24 mg/dL   Calcium 8.8 (L) 8.9 - 10.3 mg/dL   Total Protein 6.3 (L) 6.5 - 8.1 g/dL   Albumin 3.4 (L) 3.5 - 5.0 g/dL   AST 18 15 - 41 U/L   ALT 11 0 - 44 U/L   Alkaline Phosphatase 80 38 - 126 U/L   Total Bilirubin 0.6 0.3 - 1.2 mg/dL   GFR, Estimated >60 >60 mL/min    Comment: (NOTE) Calculated using the CKD-EPI Creatinine Equation (2021)    Anion gap 6 5 - 15    Comment: Performed at [facility], [place] [address] [place], [place] [address]  Troponin I (High Sensitivity)     Status: None   Collection Time: 01/14/23  3:36 AM  Result Value Ref Range   Troponin I (High Sensitivity) 4 <18 ng/L    Comment: (NOTE) Elevated high sensitivity troponin I (hsTnI) values and significant  changes across serial measurements may suggest ACS but many other  chronic and acute conditions are known to elevate hsTnI results.  Refer to the "Links" section for chest pain algorithms and additional  guidance. Performed at [facility], [place] [address] [place], [place] [address]    DG Chest 2 View  Result Date: 01/14/2023 CLINICAL DATA:  chest pain EXAM: CHEST - 2 VIEW COMPARISON:  Chest x-ray 09/10/2018 FINDINGS: The heart and mediastinal contours are within unchanged. Coronary artery stent. No focal consolidation. No pulmonary edema. No pleural effusion. No pneumothorax. No acute osseous abnormality. IMPRESSION: No active cardiopulmonary disease. Electronically Signed   By: Iven Finn M.D.   On: 01/14/2023  02:35    Pending Labs Unresulted Labs (From admission, onward)    None       Vitals/Pain Today's Vitals   01/14/23 1000 01/14/23 1140 01/14/23 1141 01/14/23 1207  BP: 138/77 (!) 142/79    Pulse: 63 65    Resp: 14 15    Temp:    98.1  F (36.7 C)  TempSrc:    Oral  SpO2: 96% 100%    Weight:      Height:      PainSc:   0-No pain     Isolation Precautions No active isolations  Medications Medications  heparin injection 5,000 Units (5,000 Units Subcutaneous Given 01/14/23 0603)  acetaminophen (TYLENOL) tablet 650 mg (has no administration in time range)    Or  acetaminophen (TYLENOL) suppository 650 mg (has no administration in time range)  alum & mag hydroxide-simeth (MAALOX/MYLANTA) 200-200-20 MG/5ML suspension 30 mL (30 mLs Oral Given 01/14/23 0143)    Mobility walks     Focused Assessments Cardiac Assessment Handoff:  Cardiac Rhythm: Normal sinus rhythm Lab Results  Component Value Date   TROPONINI <0.03 09/11/2018   No results found for: "DDIMER" Does the Patient currently have chest pain? No    R Recommendations: See Admitting Provider Note  Report given to:   Additional Notes:

## 2023-01-14 NOTE — Consult Note (Addendum)
Cardiology Consultation   Patient ID: Dale [name] MRN: [id number]; DOB: Mar 18, 1941  Admit date: 01/14/2023 Date of Consult: 01/14/2023  PCP:  Lind Covert, MD   [place] Providers Cardiologist:  None        Patient Profile:   Dale [name] is a 82 y.o. male with a hx of GERD, BPH, Lcx PCI in 2018, GERD, dementia who is being seen 01/14/2023 for the evaluation of chest pain at the request of Dr. Ardelia Mems.  History of Present Illness:  Per Family MD HPI Dale [name] is a 82 y.o. male presenting with chest pain. Chest pain started last night when he was driving home. Pain is Left side of chest, under nipple. Pt has difficulty describing characteristic of pain. Does not feel like pain he had w/ past MI or GERD. Does not worsen w/ exertion. Pt tried taking GasX and it did not help. EMS was called and daughter was instructed to administer 4 baby aspirin. Pain has improved since going to ED.    HDS Trop is negative EKG shows sinus rhythm no ST-T changes, 1st degree AV block Cp free    Past Medical History:  Diagnosis Date   Coronary artery disease    Dementia (HCC)    GERD (gastroesophageal reflux disease)    Hyperlipemia    Leg mass    rt   Memory loss    Myocardial infarction less than 4 weeks ago ([place]) 12/29/2016   Prostate disease    Quit smoking 11/20/2008   Qualifier: Diagnosis of  By: Erin Hearing MD, Wallene Huh angioplasty with stent to LCX     Past Surgical History:  Procedure Laterality Date   CARDIAC CATHETERIZATION N/A 12/30/2016   Procedure: Coronary Stent Intervention;  Surgeon: Lorretta Harp, MD;  Location: [facility];  Service: Cardiovascular;  Laterality: N/A;   CARDIAC CATHETERIZATION N/A 12/30/2016   Procedure: Coronary Angiography;  Surgeon: Lorretta Harp, MD;  Location: [facility];  Service: Cardiovascular;  Laterality: N/A;   CARDIAC CATHETERIZATION N/A 12/30/2016   Procedure: Coronary Balloon Angioplasty;   Surgeon: Lorretta Harp, MD;  Location: [facility];  Service: Cardiovascular;  Laterality: N/A;   CORONARY STENT PLACEMENT       Home Medications:  Prior to Admission medications   Medication Sig Start Date End Date Taking? Authorizing Provider  aspirin 81 MG chewable tablet Chew 1 tablet (81 mg total) by mouth daily. 12/08/20  Yes Gladys Damme, MD  clopidogrel (PLAVIX) 75 MG tablet TAKE 1 TABLET(75 MG) BY MOUTH DAILY Patient taking differently: Take 75 mg by mouth daily. 11/28/22  Yes Chambliss, Jeb Levering, MD  donepezil (ARICEPT) 5 MG tablet Take 0.5 tablets (2.5 mg total) by mouth at bedtime. 08/31/22  Yes Lind Covert, MD  loratadine (CLARITIN) 10 MG tablet Take 10 mg by mouth daily as needed for allergies.   Yes [provider]  memantine (NAMENDA) 10 MG tablet Take 1 tab twice daily. Patient taking differently: Take 10 mg by mouth 2 (two) times daily. 11/28/22  Yes Chambliss, Jeb Levering, MD  nitroGLYCERIN (NITROSTAT) 0.4 MG SL tablet Place 1 tablet (0.4 mg total) under the tongue every 5 (five) minutes x 3 doses as needed for chest pain. 09/01/21  Yes Lind Covert, MD  pantoprazole (PROTONIX) 40 MG tablet Take one daily as needed Patient taking differently: Take 40 mg by mouth daily as needed (for allergies). 08/29/22  Yes  Carney Living, MD  pravastatin (PRAVACHOL) 80 MG tablet Take 1 tablet (80 mg total) by mouth at bedtime. 10/19/22  Yes Chambliss, Estill Batten, MD  tamsulosin (FLOMAX) 0.4 MG CAPS capsule TAKE 1 CAPSULE(0.4 MG) BY MOUTH DAILY AFTER SUPPER Patient taking differently: Take 0.4 mg by mouth daily after supper. 11/28/22  Yes Carney Living, MD    Inpatient Medications: Scheduled Meds:  heparin  5,000 Units Subcutaneous Q8H   Continuous Infusions:  PRN Meds: acetaminophen **OR** acetaminophen, mouth rinse  Allergies:   No Known Allergies  Social History:   Social History   Socioeconomic History   Marital status:  Widowed    Spouse name: Not on file   Number of children: 2   Years of education: 6th   Highest education level: 5th grade  Occupational History   Occupation: Retired  Tobacco Use   Smoking status: Former    Packs/day: 1.00    Years: 20.00    Total pack years: 20.00    Types: Cigarettes    Quit date: 10/23/2011    Years since quitting: 11.2   Smokeless tobacco: Never  Vaping Use   Vaping Use: Never used  Substance and Sexual Activity   Alcohol use: No    Comment: occ   Drug use: Not Currently   Sexual activity: Not Currently    Comment: Patient would like to be. Discussed wearing condom if becomes sexually active  Other Topics Concern   Not on file  Social History Narrative   Patient lives in his home with his daughter.   Patient is a widow ~ 10 years now.   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    Patient enjoys gardening and staying active outside.    Patient enjoys spending time with his family and going to the restaurant down the street.   Social Determinants of Health   Financial Resource Strain: Low Risk  (10/29/2020)   Overall Financial Resource Strain (CARDIA)    Difficulty of Paying Living Expenses: Not hard at all  Food Insecurity: No Food Insecurity (01/14/2023)   Hunger Vital Sign    Worried About Running Out of Food in the Last Year: Never true    Ran Out of Food in the Last Year: Never true  Transportation Needs: No Transportation Needs (01/14/2023)   PRAPARE - Administrator, Civil Service (Medical): No    Lack of Transportation (Non-Medical): No  Physical Activity: Sufficiently Active (10/29/2020)   Exercise Vital Sign    Days of Exercise per Week: 7 days    Minutes of Exercise per Session: 60 min  Stress: No Stress Concern Present (10/29/2020)   [employer] of Occupational Health - Occupational Stress Questionnaire    Feeling of Stress : Only a little  Social Connections: Socially Isolated  (10/29/2020)   Social Connection and Isolation Panel [NHANES]    Frequency of Communication with Friends and Family: More than three times a week    Frequency of Social Gatherings with Friends and Family: More than three times a week    Attends Religious Services: Never    Database administrator or Organizations: No    Attends Banker Meetings: Never    Marital Status: Widowed  Intimate Partner Violence: Not At Risk (10/29/2020)   Humiliation, Afraid, Rape, and Kick questionnaire    Fear of Current or Ex-Partner: No    Emotionally Abused: No    Physically Abused: No  Sexually Abused: No    Family History:    Family History  Problem Relation Age of Onset   Cancer Mother        Unsure of type   Heart attack Father    Alzheimer's disease Father      ROS:  Please see the history of present illness.  All other ROS reviewed and negative.     Physical Exam/Data:   Vitals:   01/14/23 1140 01/14/23 1200 01/14/23 1207 01/14/23 1430  BP: (!) 142/79 133/76  (!) 144/64  Pulse: 65 (!) 59  68  Resp: 15 13  18   Temp:   98.1 F (36.7 C) 97.9 F (36.6 C)  TempSrc:   Oral Oral  SpO2: 100% 100%  100%  Weight:    68.3 kg  Height:    5\' 5"  (1.651 m)   No intake or output data in the 24 hours ending 01/14/23 1508    01/14/2023    2:30 PM 01/14/2023    1:21 AM 08/31/2022    8:37 AM  Last 3 Weights  Weight (lbs) 150 lb 9.2 oz 160 lb 156 lb 9.6 oz  Weight (kg) 68.3 kg 72.576 kg 71.033 kg     Body mass index is 25.06 kg/m.  General:  Well nourished, well developed, in no acute distress HEENT: normal Neck: no JVD Vascular: No carotid bruits; Distal pulses 2+ bilaterally Cardiac:  normal S1, S2; RRR; no murmur  Lungs:  clear to auscultation bilaterally, no wheezing, rhonchi or rales  Abd: soft, nontender, no hepatomegaly  Ext: no edema Musculoskeletal:  No deformities, BUE and BLE strength normal and equal Skin: warm and dry  Neuro:  CNs 2-12 intact, no focal  abnormalities noted Psych:  Normal affect   EKG:  The EKG was personally reviewed and demonstrates:  NSR, 1st degree AV block   Telemetry:  Telemetry was personally reviewed and demonstrates:  NSR  Relevant CV Studies: LHC 12/30/2016 Prox Cx to Mid Cx lesion, 100 %stenosed. Post intervention, there is a 0% residual stenosis. A stent was successfully placed.  TTE 12/31/2016 Left ventricle: The cavity size was normal. Wall thickness was    normal. Systolic function was normal. The estimated ejection    fraction was in the range of 55% to 60%. Wall motion was normal;    there were no regional wall motion abnormalities. Left    ventricular diastolic function parameters were normal.    09/11/2018 Narrative & Impression  CLINICAL DATA:  Prior myocardial infarction. Abnormal EKG with first degree av block. Hyperlipidemia. Coronary artery disease.   EXAM: MYOCARDIAL IMAGING WITH SPECT (REST AND PHARMACOLOGIC-STRESS)   GATED LEFT VENTRICULAR WALL MOTION STUDY   LEFT VENTRICULAR EJECTION FRACTION   TECHNIQUE: Standard myocardial SPECT imaging was performed after resting intravenous injection of 10 mCi Tc-77m tetrofosmin. Subsequently, intravenous infusion of Lexiscan was performed under the supervision of the Cardiology staff. At peak effect of the drug, 30 mCi Tc-38m tetrofosmin was injected intravenously and standard myocardial SPECT imaging was performed. Quantitative gated imaging was also performed to evaluate left ventricular wall motion, and estimate left ventricular ejection fraction.   COMPARISON:  08/07/2014   FINDINGS: Perfusion: Reduced activity in the inferior wall extending into the inferolateral base on stress and rest images, suspicious for moderate-sized scar. There is also some reduced activity at the cardiac apex compatible with apical thinning or scar. No inducible ischemia is identified.   Wall Motion: Normal left ventricular wall motion. No  left ventricular dilation.  Left Ventricular Ejection Fraction: 58 %   End diastolic volume 88 ml   End systolic volume 37 ml   IMPRESSION: 1. No inducible ischemia. Matched reduced activity in the inferior wall extending into the inferolateral base, compatible with moderate-sized scar. Possible scarring along the cardiac apex.   2. Normal left ventricular wall motion.   3. Left ventricular ejection fraction 58%     Laboratory Data:  High Sensitivity Troponin:   Recent Labs  Lab 01/14/23 0141 01/14/23 0336  TROPONINIHS 6 4     Chemistry Recent Labs  Lab 01/14/23 0141  NA 138  K 3.7  CL 105  CO2 27  GLUCOSE 117*  BUN 13  CREATININE 1.16  CALCIUM 8.8*  GFRNONAA >60  ANIONGAP 6    Recent Labs  Lab 01/14/23 0141  PROT 6.3*  ALBUMIN 3.4*  AST 18  ALT 11  ALKPHOS 80  BILITOT 0.6   Lipids No results for input(s): "CHOL", "TRIG", "HDL", "LABVLDL", "LDLCALC", "CHOLHDL" in the last 168 hours.  Hematology Recent Labs  Lab 01/14/23 0141  WBC 6.3  RBC 4.32  HGB 12.0*  HCT 38.6*  MCV 89.4  MCH 27.8  MCHC 31.1  RDW 12.3  PLT 218   Thyroid No results for input(s): "TSH", "FREET4" in the last 168 hours.  BNPNo results for input(s): "BNP", "PROBNP" in the last 168 hours.  DDimer No results for input(s): "DDIMER" in the last 168 hours.   Radiology/Studies:  DG Chest 2 View  Result Date: 01/14/2023 CLINICAL DATA:  chest pain EXAM: CHEST - 2 VIEW COMPARISON:  Chest x-ray 09/10/2018 FINDINGS: The heart and mediastinal contours are within unchanged. Coronary artery stent. No focal consolidation. No pulmonary edema. No pleural effusion. No pneumothorax. No acute osseous abnormality. IMPRESSION: No active cardiopulmonary disease. Electronically Signed   By: Iven Finn M.D.   On: 01/14/2023 02:35     Assessment and Plan:   Mr Madariaga is a 82 y/o M with hx of low risk stress in 2015, NM SPECT which showed no inducible ischemia. Did have inferior/inferolateral  scar. EF 58% 09/2018. LHC showed 100 % prox-mid Lcx stenosis s/p PCI 12/30/2016. Echo in 2018 showed normal EF. No valve disease. RV fxn. IVC is normal. He has no signs of ACS. With exertional angina, can repeat SPECT. Will plan for this as an outpatient and follow-up. - start imdur 60 mg daily - continue asa 81 mg daily - can stop plavix ( PCI > 6 months ago) - nitro SL 0.4 - continue pravastatin 80 mg daily  Patient can be discharged home today.  Risk Assessment/Risk Scores:       For questions or updates, please contact [facility] Please consult [email or web address] for contact info under    Signed, Janina Mayo, MD  01/14/2023 3:08 PM

## 2023-01-17 ENCOUNTER — Inpatient Hospital Stay: Payer: Medicare HMO | Admitting: Family Medicine

## 2023-01-18 ENCOUNTER — Inpatient Hospital Stay: Payer: Medicare HMO | Admitting: Family Medicine

## 2023-01-20 ENCOUNTER — Telehealth (HOSPITAL_COMMUNITY): Payer: Self-pay | Admitting: *Deleted

## 2023-01-20 NOTE — Telephone Encounter (Signed)
Left message on voicemail per DPR in reference to upcoming appointment scheduled on 01/25/2023 at 10:45 with detailed instructions given per Myocardial Perfusion Study Information Sheet for the test. LM to arrive 15 minutes early, and that it is imperative to arrive on time for appointment to keep from having the test rescheduled. If you need to cancel or reschedule your appointment, please call the office within 24 hours of your appointment. Failure to do so may result in a cancellation of your appointment, and a $50 no show fee. Phone number given for call back for any questions.

## 2023-01-24 ENCOUNTER — Ambulatory Visit (INDEPENDENT_AMBULATORY_CARE_PROVIDER_SITE_OTHER): Payer: Medicare HMO | Admitting: Family Medicine

## 2023-01-24 ENCOUNTER — Encounter: Payer: Self-pay | Admitting: Family Medicine

## 2023-01-24 VITALS — BP 131/107 | HR 68 | Ht 65.0 in | Wt 160.4 lb

## 2023-01-24 DIAGNOSIS — I952 Hypotension due to drugs: Secondary | ICD-10-CM

## 2023-01-24 DIAGNOSIS — G308 Other Alzheimer's disease: Secondary | ICD-10-CM | POA: Diagnosis not present

## 2023-01-24 DIAGNOSIS — R079 Chest pain, unspecified: Secondary | ICD-10-CM

## 2023-01-24 DIAGNOSIS — F02B0 Dementia in other diseases classified elsewhere, moderate, without behavioral disturbance, psychotic disturbance, mood disturbance, and anxiety: Secondary | ICD-10-CM | POA: Diagnosis not present

## 2023-01-24 MED ORDER — DOCUSATE SODIUM 100 MG PO CAPS: ORAL_CAPSULE | ORAL | 3 refills | Status: AC

## 2023-01-24 MED ORDER — COMFORT TOUCH BP CUFF/MEDIUM MISC: 0 refills | Status: DC

## 2023-01-24 NOTE — Assessment & Plan Note (Signed)
Is hypertensive today.  Restart imdur.  Sent Rx for blood pressure cuff to pharmacy.  Hopefully daughter can follow his blood pressure

## 2023-01-24 NOTE — Assessment & Plan Note (Signed)
None since discharge.   Daughter feels might have been GI.  Has not been using imdur but given his blood pressure today urged them to start it.  Myoview set up for tomorrow

## 2023-01-24 NOTE — Assessment & Plan Note (Signed)
Slowly progressive.  He is more passive and defers to his daughter for most issues.  Need to review EOL plans and documents next visit

## 2023-01-24 NOTE — Patient Instructions (Signed)
Good to see you today - Thank you for coming in  Things we discussed today:  Take all the medications on his list including imdur and the stool softner you added  If he has sudden severe chest pain that does not go away with antiacid he should go to the ER  Let me know his he has dizziness or lightheadness when standing,   If you check his blood pressure is should be between 100-140/60-90  Let me know if any problems seeing the cardiologist   Please always bring your medication bottles  Come back to see me in 3-6 months for a check up

## 2023-01-24 NOTE — Progress Notes (Signed)
    SUBJECTIVE:   CHIEF COMPLAINT / HPI:   Chest Pain Has not had much since he left the hospital.  His daughter added a stool softner which she believes has helped.  He has not been taking imdur due to concerns about its affect on his blood pressure His daughter sets out his medications weekly in a pill box   DC Summary 01/14/23 "Patient presented with left-sided chest pain that started night prior to admission.  In the ED had substernal chest pain concerning for ACS.  EKG without acute ischemic changes and troponin trend negative.  Patient's chest pain resolved on admission with no recurrence.  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  They recommended repeat SPECT outpatient and follow-up with cardiology outpatient.  He started Imdur in patient.  They recommended continuing aspirin and stopping Plavix as PCI was greater than 6 months ago.  May do nitro sublingually as needed and continue pravastatin. "  Has cardiac test tomorrow and follow up 2/27  OBJECTIVE:   BP (!) 131/107   Pulse 68   Ht 5\' 5"  (1.651 m)   Wt 160 lb 6.4 oz (72.8 kg)   SpO2 100%   BMI 26.69 kg/m   Heart - Regular rate and rhythm.  No murmurs, gallops or rubs.    Lungs:  Normal respiratory effort, chest expands symmetrically. Lungs are clear to auscultation, no crackles or wheezes. Extremities:  No cyanosis, edema, or deformity noted with good range of motion of all major joints.   Conversant but more withdrawn passive than in past   ASSESSMENT/PLAN:   Chest pain, unspecified type Assessment & Plan: None since discharge.   Daughter feels might have been GI.  Has not been using imdur but given his blood pressure today urged them to start it.  Myoview set up for tomorrow    Hypotension due to drugs Assessment & Plan: Is hypertensive today.  Restart imdur.  Sent Rx for blood pressure cuff to pharmacy.  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  He is more passive and defers to his daughter for most issues.  Need to review EOL plans and documents next visit    Other orders -     Docusate Sodium; Take 1-2 every few days as needed  Dispense: 30 capsule; Refill: 3 -     Comfort Touch BP Cuff/Medium; Use daily  Dispense: 1 each; Refill: 0     Patient Instructions  Good to see you today - Thank you for coming in  Things we discussed today:  Take all the medications on his list including imdur and the stool softner you added  If he has sudden severe chest pain that does not go away with antiacid he should go to the ER  Let me know his he has dizziness or lightheadness when standing,   If you check his blood pressure is should be between 100-140/60-90  Let me know if any problems seeing the cardiologist   Please always bring your medication bottles  Come back to see me in 3-6 months for a check up    Lind Covert, [place]

## 2023-01-25 ENCOUNTER — Ambulatory Visit (HOSPITAL_COMMUNITY): Payer: Medicare HMO | Attending: Cardiology

## 2023-01-25 DIAGNOSIS — R079 Chest pain, unspecified: Secondary | ICD-10-CM | POA: Insufficient documentation

## 2023-01-25 LAB — MYOCARDIAL PERFUSION IMAGING
LV dias vol: 71 mL (ref 62–150)
LV sys vol: 30 mL
Nuc Stress EF: 58 %
Peak HR: 86 {beats}/min
Rest HR: 55 {beats}/min
Rest Nuclear Isotope Dose: 10.1 mCi
SDS: 2
SRS: 0
SSS: 2
ST Depression (mm): 0 mm
Stress Nuclear Isotope Dose: 30.7 mCi
TID: 0.92

## 2023-01-25 MED ORDER — TECHNETIUM TC 99M TETROFOSMIN IV KIT
10.1000 | PACK | Freq: Once | INTRAVENOUS | Status: AC | PRN
  Administered 2023-01-25: 10.1 via INTRAVENOUS

## 2023-01-25 MED ORDER — TECHNETIUM TC 99M TETROFOSMIN IV KIT
30.7000 | PACK | Freq: Once | INTRAVENOUS | Status: AC | PRN
  Administered 2023-01-25: 30.7 via INTRAVENOUS

## 2023-01-25 MED ORDER — REGADENOSON 0.4 MG/5ML IV SOLN
0.4000 mg | Freq: Once | INTRAVENOUS | Status: AC
  Administered 2023-01-25: 0.4 mg via INTRAVENOUS

## 2023-02-06 ENCOUNTER — Ambulatory Visit: Payer: Medicare HMO | Attending: Nurse Practitioner | Admitting: Nurse Practitioner

## 2023-02-06 ENCOUNTER — Encounter: Payer: Self-pay | Admitting: Nurse Practitioner

## 2023-02-06 VITALS — BP 108/60 | HR 58 | Ht 68.0 in | Wt 157.4 lb

## 2023-02-06 DIAGNOSIS — I2510 Atherosclerotic heart disease of native coronary artery without angina pectoris: Secondary | ICD-10-CM | POA: Diagnosis not present

## 2023-02-06 DIAGNOSIS — R079 Chest pain, unspecified: Secondary | ICD-10-CM

## 2023-02-06 DIAGNOSIS — E785 Hyperlipidemia, unspecified: Secondary | ICD-10-CM

## 2023-02-06 NOTE — Patient Instructions (Signed)
Medication Instructions:  Your physician recommends that you continue on your current medications as directed. Please refer to the Current Medication list given to you today.   *If you need a refill on your cardiac medications before your next appointment, please call your pharmacy*   Lab Work: NONE ordered at this time of appointment   If you have labs (blood work) drawn today and your tests are completely normal, you will receive your results only by: Reading (if you have MyChart) OR A paper copy in the mail If you have any lab test that is abnormal or we need to change your treatment, we will call you to review the results.   Testing/Procedures: NONE ordered at this time of appointment     Follow-Up: At [facility], you and your health needs are our priority.  As part of our continuing mission to provide you with exceptional heart care, we have created designated Provider Care Teams.  These Care Teams include your primary Cardiologist (physician) and Advanced Practice Providers (APPs -  Physician Assistants and Nurse Practitioners) who all work together to provide you with the care you need, when you need it.  We recommend signing up for the patient portal called "MyChart".  Sign up information is provided on this After Visit Summary.  MyChart is used to connect with patients for Virtual Visits (Telemedicine).  Patients are able to view lab/test results, encounter notes, upcoming appointments, etc.  Non-urgent messages can be sent to your provider as well.   To learn more about what you can do with MyChart, go to [email or web address].    Your next appointment:   3-4 month(s)  Provider:   Diona Browner, NP        Other Instructions

## 2023-02-06 NOTE — Progress Notes (Signed)
Office Visit    [name] Date of Encounter: 02/06/2023  Primary Care Provider:  Lind Covert, MD Primary Cardiologist:  Quay Burow, MD  Chief Complaint    82 year old male with a history of CAD s/p NSTEMI, DES-p-mLCx in 2018, hyperlipidemia, BPH, GERD, and dementia who presents for follow-up related to CAD.  Past Medical History    Past Medical History:  Diagnosis Date   Coronary artery disease    Dementia (HCC)    GERD (gastroesophageal reflux disease)    Hyperlipemia    Leg mass    rt   Memory loss    Myocardial infarction less than 4 weeks ago ([place]) 12/29/2016   Prostate disease    Quit smoking 11/20/2008   Qualifier: Diagnosis of  By: Erin Hearing MD, Wallene Huh angioplasty with stent to LCX    Past Surgical History:  Procedure Laterality Date   CARDIAC CATHETERIZATION N/A 01/12/2017   Procedure: Coronary Stent Intervention;  Surgeon: Lorretta Harp, MD;  Location: [facility];  Service: Cardiovascular;  Laterality: N/A;   CARDIAC CATHETERIZATION N/A 2017/01/12   Procedure: Coronary Angiography;  Surgeon: Lorretta Harp, MD;  Location: [facility];  Service: Cardiovascular;  Laterality: N/A;   CARDIAC CATHETERIZATION N/A 01-12-17   Procedure: Coronary Balloon Angioplasty;  Surgeon: Lorretta Harp, MD;  Location: [facility];  Service: Cardiovascular;  Laterality: N/A;   CORONARY STENT PLACEMENT      Allergies  No Known Allergies   Labs/Other Studies Reviewed    The following studies were reviewed today: LHC 12-Jan-2017: Prox Cx to Mid Cx lesion, 100 %stenosed. Post intervention, there is a 0% residual stenosis. A stent was successfully placed.   TTE 12/31/2016: Left ventricle: The cavity size was normal. Wall thickness was    normal. Systolic function was normal. The estimated ejection    fraction was in the range of 55% to 60%. Wall motion was normal;    there were no regional wall motion  abnormalities. Left    ventricular diastolic function parameters were normal.    Myoview 2019:   IMPRESSION: 1. No inducible ischemia. Matched reduced activity in the inferior wall extending into the inferolateral base, compatible with moderate-sized scar. Possible scarring along the cardiac apex.   2. Normal left ventricular wall motion.   3. Left ventricular ejection fraction 58%  Myoview 01/2023: Study Highlights      Findings are consistent with infarction. The study is low risk.   No ST deviation was noted.   LV perfusion is abnormal. There is no evidence of ischemia. There is evidence of infarction. Defect 1: There is a medium defect with mild reduction in uptake present in the apical to basal inferior and inferolateral location(s) that is fixed. There is abnormal wall motion in the defect area. Consistent with infarction.   Left ventricular function is normal. Nuclear stress EF: 58 %. The left ventricular ejection fraction is normal (55-65%). End diastolic cavity size is normal. End systolic cavity size is normal.   Prior study available for comparison from 09/11/2018. No changes compared to prior study.   No evidence of ischemia. Fixed mild defect in inferior/inferolateral wall most consistent with prior infarction.   Recent Labs: 01/14/2023: ALT 11; BUN 13; Creatinine, Ser 1.16; Hemoglobin 12.0; Platelets 218; Potassium 3.7; Sodium 138  Recent Lipid Panel    Component Value Date/Time   CHOL CANCELED 09/01/2021 1137   TRIG CANCELED 09/01/2021 1137  HDL CANCELED 09/01/2021 1137   CHOLHDL 3.1 01/20/2021 1117   CHOLHDL 2.4 09/11/2018 0354   VLDL 18 09/11/2018 0354   LDLCALC 111 (H) 01/20/2021 1117   LDLDIRECT 70 03/24/2022 0944    History of Present Illness    82 year old male with the above past medical history including CAD s/p NSTEMI, DES-p-mLCx in 2018,  hyperlipidemia, BPH, GERD, and dementia.  He has a history of low risk stress test in 2015.  He was hospitalized  in 2018 in setting of NSTEMI.  Cardiac catheterization revealed 100% proximal to mid left circumflex stenosis, s/p DES.  EF was normal at the time.  Myoview in 2019 was negative for ischemia.  He was last seen in the office on 09/27/2019 and was stable from a cardiac standpoint.  He denied symptoms concerning for angina.  He has not been seen in follow-up since.  He presented to the ED on 01/14/2023 with complaints of exertional chest pain.  Troponin was negative, EKG was without significant change, cardiology was consulted.  He was started on Imdur 60 mg daily.  Plavix was discontinued.  He was discharged home.  Outpatient Lexiscan Myoview showed no evidence of ischemia, fixed mild defect in inferior/inferolateral wall most consistent with prior infarction.  He presents today for follow-up accompanied by his daughter.  Since his last visit he has been stable from a cardiac standpoint.  He has noted some mild fleeting intermittent chest discomfort that has improved significantly since starting Imdur.  Overall, he reports feeling well.  Home Medications    Current Outpatient Medications  Medication Sig Dispense Refill   aspirin 81 MG chewable tablet Chew 1 tablet (81 mg total) by mouth daily. 90 tablet 3   Blood Pressure Monitoring (COMFORT TOUCH BP CUFF/MEDIUM) MISC Use daily 1 each 0   docusate sodium (COLACE) 100 MG capsule Take 1-2 every few days as needed 30 capsule 3   donepezil (ARICEPT) 5 MG tablet Take 0.5 tablets (2.5 mg total) by mouth at bedtime. 15 tablet 1   isosorbide mononitrate (IMDUR) 60 MG 24 hr tablet Take 1 tablet (60 mg total) by mouth daily. 30 tablet 0   loratadine (CLARITIN) 10 MG tablet Take 10 mg by mouth daily as needed for allergies.     memantine (NAMENDA) 10 MG tablet Take 1 tab twice daily. (Patient taking differently: Take 10 mg by mouth 2 (two) times daily.) 180 tablet 2   nitroGLYCERIN (NITROSTAT) 0.4 MG SL tablet Place 1 tablet (0.4 mg total) under the tongue every 5  (five) minutes x 3 doses as needed for chest pain. 25 tablet 12   pantoprazole (PROTONIX) 40 MG tablet Take one daily as needed (Patient taking differently: Take 40 mg by mouth daily as needed (for allergies).) 90 tablet 1   pravastatin (PRAVACHOL) 80 MG tablet Take 1 tablet (80 mg total) by mouth at bedtime. 90 tablet 3   tamsulosin (FLOMAX) 0.4 MG CAPS capsule TAKE 1 CAPSULE(0.4 MG) BY MOUTH DAILY AFTER SUPPER (Patient taking differently: Take 0.4 mg by mouth daily after supper.) 90 capsule 3   No current facility-administered medications for this visit.     Review of Systems    He denies palpitations, dyspnea, pnd, orthopnea, n, v, dizziness, syncope, edema, weight gain, or early satiety. All other systems reviewed and are otherwise negative except as noted above.   Physical Exam    VS:  BP 108/60   Pulse (!) 58   Ht '5\' 8"'$  (1.727 m)   Wt 157  lb 6.4 oz (71.4 kg)   SpO2 96%   BMI 23.93 kg/m   GEN: Well nourished, well developed, in no acute distress. HEENT: normal. Neck: Supple, no JVD, carotid bruits, or masses. Cardiac: RRR, no murmurs, rubs, or gallops. No clubbing, cyanosis, edema.  Radials/DP/PT 2+ and equal bilaterally.  Respiratory:  Respirations regular and unlabored, clear to auscultation bilaterally. GI: Soft, nontender, nondistended, BS + x 4. MS: no deformity or atrophy. Skin: warm and dry, no rash. Neuro:  Strength and sensation are intact. Psych: Normal affect.  Accessory Clinical Findings    ECG personally reviewed by me today -sinus bradycardia, 58 bpm, first-degree AV block- no acute changes.   Lab Results  Component Value Date   WBC 6.3 01/14/2023   HGB 12.0 (L) 01/14/2023   HCT 38.6 (L) 01/14/2023   MCV 89.4 01/14/2023   PLT 218 01/14/2023   Lab Results  Component Value Date   CREATININE 1.16 01/14/2023   BUN 13 01/14/2023   NA 138 01/14/2023   K 3.7 01/14/2023   CL 105 01/14/2023   CO2 27 01/14/2023   Lab Results  Component Value Date   ALT  11 01/14/2023   AST 18 01/14/2023   ALKPHOS 80 01/14/2023   BILITOT 0.6 01/14/2023   Lab Results  Component Value Date   CHOL CANCELED 09/01/2021   HDL CANCELED 09/01/2021   LDLCALC 111 (H) 01/20/2021   LDLDIRECT 70 03/24/2022   TRIG CANCELED 09/01/2021   CHOLHDL 3.1 01/20/2021    Lab Results  Component Value Date   HGBA1C 5.3 08/07/2014    Assessment & Plan   1. CAD/chest pain: S/p NSTEMI, DES-p-mLCx in 2018.  Recent ED visit with complaints of exertional chest pain, troponin was negative, EKG was stable.  He was started on Imdur.  Outpatient Lexiscan Myoview showed no evidence of ischemia, fixed mild defect in the inferior/inferolateral wall consistent with prior infarct.  Symptoms have improved on Imdur.  He has noted some intermittent fleeting chest discomfort,  he declines further titration of Imdur at this time. Continue to monitor symptoms.  If he continues to have chest discomfort consider increasing Imdur.  Continue aspirin, Imdur, pravastatin.  2. Hyperlipidemia:  dLDL was 70 in 03/2022. Continue Pravastatin.   3. Disposition: Follow-up in 3 to 4 months.     Lenna Sciara, NP 02/06/2023, 5:07 PM

## 2023-02-19 ENCOUNTER — Other Ambulatory Visit: Payer: Self-pay | Admitting: Student

## 2023-04-07 ENCOUNTER — Telehealth: Payer: Self-pay | Admitting: Family Medicine

## 2023-04-07 NOTE — Telephone Encounter (Signed)
Contacted Dale [name] to schedule their annual wellness visit. Call back at later date: When appt avail on Tues or Weds.  I spoke to patient's daughter, [name], she's off on Tuesdays and Wednesdays.  Thank you,  [facility] Direct dial  [phone]

## 2023-05-09 ENCOUNTER — Ambulatory Visit: Payer: Medicare HMO | Attending: Nurse Practitioner | Admitting: Nurse Practitioner

## 2023-05-09 ENCOUNTER — Encounter: Payer: Self-pay | Admitting: Nurse Practitioner

## 2023-05-09 VITALS — BP 110/62 | HR 62 | Ht 65.0 in | Wt 155.6 lb

## 2023-05-09 DIAGNOSIS — E785 Hyperlipidemia, unspecified: Secondary | ICD-10-CM | POA: Diagnosis not present

## 2023-05-09 DIAGNOSIS — I2510 Atherosclerotic heart disease of native coronary artery without angina pectoris: Secondary | ICD-10-CM | POA: Diagnosis not present

## 2023-05-09 DIAGNOSIS — R079 Chest pain, unspecified: Secondary | ICD-10-CM | POA: Diagnosis not present

## 2023-05-09 MED ORDER — NITROGLYCERIN 0.4 MG SL SUBL: 0.4000 mg | SUBLINGUAL_TABLET | SUBLINGUAL | 12 refills | Status: AC | PRN

## 2023-05-09 MED ORDER — ISOSORBIDE MONONITRATE ER 60 MG PO TB24: ORAL_TABLET | ORAL | 3 refills | Status: DC

## 2023-05-09 MED ORDER — NITROGLYCERIN 0.4 MG SL SUBL: 0.4000 mg | SUBLINGUAL_TABLET | SUBLINGUAL | 12 refills | Status: DC | PRN

## 2023-05-09 NOTE — Progress Notes (Signed)
Office Visit    [name] Date of Encounter: 05/09/2023  Primary Care Provider:  Carney Living, MD Primary Cardiologist:  Nanetta Batty, MD  Chief Complaint    82 year old male with a history of CAD [name]/p NSTEMI, DES-p-mLCx in 2018, hyperlipidemia, BPH, GERD, and dementia who presents for follow-up related to CAD.  Past Medical History    Past Medical History:  Diagnosis Date   Coronary artery disease    Dementia (HCC)    GERD (gastroesophageal reflux disease)    Hyperlipemia    Leg mass    rt   Memory loss    Myocardial infarction less than 4 weeks ago (HCC) 12/29/2016   Prostate disease    Quit smoking 11/20/2008   Qualifier: Diagnosis of  By: Deirdre Priest MD, Gray Bernhardt angioplasty with stent to LCX    Past Surgical History:  Procedure Laterality Date   CARDIAC CATHETERIZATION N/A 12/30/2016   Procedure: Coronary Stent Intervention;  Surgeon: Runell Gess, MD;  Location: [facility] INVASIVE CV LAB;  Service: Cardiovascular;  Laterality: N/A;   CARDIAC CATHETERIZATION N/A 12/30/2016   Procedure: Coronary Angiography;  Surgeon: Runell Gess, MD;  Location: [facility] INVASIVE CV LAB;  Service: Cardiovascular;  Laterality: N/A;   CARDIAC CATHETERIZATION N/A 12/30/2016   Procedure: Coronary Balloon Angioplasty;  Surgeon: Runell Gess, MD;  Location: [facility] INVASIVE CV LAB;  Service: Cardiovascular;  Laterality: N/A;   CORONARY STENT PLACEMENT      Allergies  No Known Allergies   Labs/Other Studies Reviewed    The following studies were reviewed today:  Cardiac Studies & Procedures   CARDIAC CATHETERIZATION  CARDIAC CATHETERIZATION 12/30/2016  Narrative Images from the original result were not included.   Prox Cx to Mid Cx lesion, 100 %stenosed.  Post intervention, there is a 0% residual stenosis.  A stent was successfully placed.  Dale [name] is a 82 y.o. male   [id number] LOCATION:  FACILITY: [facility] PHYSICIAN: Nanetta Batty,  M.D. [birth year]   DATE OF PROCEDURE:  12/30/2016  DATE OF DISCHARGE:     CARDIAC CATHETERIZATION / PCI-DES    History obtained from chart review. Dale [name] is a 82 year old widowed African-American male father of 2 children who presented to [facility] emergency room with chest pain. He has a history of remote tobacco abuse having stopped 5 years ago and hyperlipidemia. He has never had a heart attack. He developed chest pain Saturday night after dancing and has had on and off chest pain since. The pain was worse last night and this morning. His blood work was remarkable for mild elevation of troponins of 1.7 and his EKG showed subtle lateral ST segment elevation with T wave inversion. He presents now for angiography and potential intervention.   PROCEDURE DESCRIPTION:  The patient was brought to the second floor  Cardiac cath lab in the postabsorptive state. He was premedicated with Valium 5 mg by mouth, IV Versed and fentanyl. His right wristwas prepped and shaved in usual sterile fashion. Xylocaine 1% was used for local anesthesia. A 6 French sheath was inserted into the right radial artery using standard Seldinger technique. The patient received 3500 units  of heparin  intravenously.  A 5 [place] TIG catheter was used for selective coronary angiography. Isovue dye was used for the entirety of the case. Retrograde aortic pressure was monitored during the case. The patient received radial cocktail via the SideArm sheath.  The patient received 180 mg by  mouth Brilenta as well as Angiomax bolus and infusion. A therapeutic ACT was documented. Using a 6 [place] XB 3.0 cm guide catheter along with an 014  Pro Water guidewire and a 2 mm balloon the lesion was crossed in the mid AV circumflex and dilated. This revealed a bifurcation stenosis at the first obtuse marginal branch. I was unable to "double wire" the AV groove and OM branches and therefore placed a 2.5 x 16 mm long Medtronic Onyx  drug-eluting stent across the AV groove crossing the origin of the OM branch and deployed at 15 atm. I postdilated with a 2.5 x 12 mm noncompliant balloon up to 16 atm. I then was able to reenter the OM branch through the stent struts with an  Whisper wire and dilated the ostium of the OM branch with a 2 mm x 8 mm long noncompliant balloon resulting in reduction of a 99% ostial stenosis to less than 30% residual. There was some recoil. There was TIMI-3 flow. The patient'[name] pain resolved at the end of the case. The guidewire and catheter were removed. The sheath was removed and a TR band was placed on the right breast to achieve patent hemostasis. The patient left the lab in stable condition.  Impression : Successful PCI and drug-eluting stenting of an occluded nondominant circumflex in the setting of a non-STEMI with dilatation of an obtuse marginal branch through the struts. Patient had TIMI-3 flow in both the circumflex and obtuse marginal branch. He tolerated the procedure well. The guidewire and catheter were removed. Sheath was removed and a TR band was placed on the right wrist. Angiomax will continue full dose for 4 hours. He will be treated with aspirin, and high-dose statin drugs. He was tachycardic and therefore beta blocker was not started. A 2-D echo will be performed. He'll be fast tracked most likely discharged Sunday or Monday.   Dale [name], Dale [name]. MD, FACC 12/30/2016 6:00 PM  Findings Coronary Findings Diagnostic  Dominance: Right  Left Circumflex  Intervention  Prox Cx to Mid Cx lesion Angioplasty A stent was successfully placed. There is a 0% residual stenosis post intervention.   STRESS TESTS  MYOCARDIAL PERFUSION IMAGING 01/25/2023  Narrative   Findings are consistent with infarction. The study is low risk.   No ST deviation was noted.   LV perfusion is abnormal. There is no evidence of ischemia. There is evidence of infarction. Defect 1: There is a medium defect with mild  reduction in uptake present in the apical to basal inferior and inferolateral location([name]) that is fixed. There is abnormal wall motion in the defect area. Consistent with infarction.   Left ventricular function is normal. Nuclear stress EF: 58 %. The left ventricular ejection fraction is normal (55-65%). End diastolic cavity size is normal. End systolic cavity size is normal.   Prior study available for comparison from 09/11/2018. No changes compared to prior study.  No evidence of ischemia. Fixed mild defect in inferior/inferolateral wall most consistent with prior infarction.   ECHOCARDIOGRAM  ECHOCARDIOGRAM COMPLETE 12/31/2016  Narrative ** *[facility]* [address] [place], [place] [address] [phone]  ------------------------------------------------------------------- Transthoracic Echocardiography  Patient:    Dale [name], Dale [name] MR #:       [id number] Study Date: 12/31/2016 Gender:     M Age:        75 Height:     165.1 cm Weight:     65 .1 kg BSA:        1.74 m^2 Pt.  Status: Room:       2W11C  ATTENDING    Nanetta Batty, MD ADMITTING    Zoila Shutter MD SONOGRAPHER  Delcie Roch, RDCS, CCT PERFORMING   [facility], Inpatient ORDERING     Bhagat, Bhavinkumar  cc:  ------------------------------------------------------------------- LV EF: 55% -   60%  ------------------------------------------------------------------- Indications:      CAD of native vessels 414.01.  ------------------------------------------------------------------- History:   Risk factors:  Dyslipidemia.  ------------------------------------------------------------------- Study Conclusions  - Left ventricle: The cavity size was normal. Wall thickness was normal. Systolic function was normal. The estimated ejection fraction was in the range of 55% to 60%. Wall motion was normal; there were no regional wall motion abnormalities. Left ventricular diastolic function  parameters were normal.  ------------------------------------------------------------------- Study data:  No prior study was available for comparison.  Study status:  Routine.  Procedure:  The patient reported no pain pre or post test. Transthoracic echocardiography. Image quality was good. Study completion:  There were no complications. Transthoracic echocardiography.  M-mode, complete 2D, spectral Doppler, and color Doppler.  Birthdate:  Patient birthdate: January 27, 1941.  Age:  Patient is 82 yr old.  Sex:  Gender: male. BMI: 23.9 kg/m^2.  Blood pressure:     116/72  Patient status: Inpatient.  Study date:  Study date: 12/31/2016. Study time: 12:56 PM.  Location:  Echo laboratory.  -------------------------------------------------------------------  ------------------------------------------------------------------- Left ventricle:  The cavity size was normal. Wall thickness was normal. Systolic function was normal. The estimated ejection fraction was in the range of 55% to 60%. Wall motion was normal; there were no regional wall motion abnormalities. The transmitral flow pattern was normal. The deceleration time of the early transmitral flow velocity was normal. The pulmonary vein flow pattern was normal. The tissue Doppler parameters were normal. Left ventricular diastolic function parameters were normal.  ------------------------------------------------------------------- Aortic valve:   Structurally normal valve.   Cusp separation was normal.  Doppler:  Transvalvular velocity was within the normal range. There was no stenosis. There was no regurgitation.  ------------------------------------------------------------------- Aorta:  Aortic root: The aortic root was normal in size. Ascending aorta: The ascending aorta was normal in size.  ------------------------------------------------------------------- Mitral valve:   Structurally normal valve.   Leaflet separation was normal.   Doppler:  Transvalvular velocity was within the normal range. There was no evidence for stenosis. There was no regurgitation.    Peak gradient (D): 3 mm Hg.  ------------------------------------------------------------------- Left atrium:  The atrium was normal in size.  ------------------------------------------------------------------- Right ventricle:  The cavity size was normal. Wall thickness was normal. Systolic function was normal.  ------------------------------------------------------------------- Pulmonic valve:    The valve appears to be grossly normal.  ------------------------------------------------------------------- Tricuspid valve:   Structurally normal valve.   Leaflet separation was normal.  Doppler:  Transvalvular velocity was within the normal range. There was trivial regurgitation.  ------------------------------------------------------------------- Right atrium:  The atrium was normal in size.  ------------------------------------------------------------------- Pericardium:  There was no pericardial effusion.  ------------------------------------------------------------------- Systemic veins: Inferior vena cava: The vessel was normal in size. The respirophasic diameter changes were in the normal range (>= 50%), consistent with normal central venous pressure.  ------------------------------------------------------------------- Measurements  Left ventricle                         Value        Reference LV ID, ED, PLAX chordal                43.8  mm     43 -  52 LV ID, ES, PLAX chordal                30.7  mm     23 - 38 LV fx shortening, PLAX chordal         30    %      >=29 LV PW thickness, ED                    9.63  mm     ---------- IVS/LV PW ratio, ED                    0.84         <=1.3 Stroke volume, 2D                      81    ml     ---------- Stroke volume/bsa, 2D                  47    ml/m^2 ---------- LV e&', lateral                          7.32  cm/[name]   ---------- LV E/e&', lateral                       10.86        ---------- LV e&', medial                          7.88  cm/[name]   ---------- LV E/e&', medial                        10.09        ---------- LV e&', average                         7.6   cm/[name]   ---------- LV E/e&', average                       10.46        ----------  Ventricular septum                     Value        Reference IVS thickness, ED                      8.06  mm     ----------  LVOT                                   Value        Reference LVOT ID, [name]                             21    mm     ---------- LVOT area                              3.46  cm^2   ---------- LVOT peak velocity, [name]                  119   cm/[name]   ---------- LVOT  mean velocity, [name]                  71.2  cm/[name]   ---------- LVOT VTI, [name]                            23.5  cm     ---------- LVOT peak gradient, [name]                  6     mm Hg  ----------  Aorta                                  Value        Reference Aortic root ID, ED                     29    mm     ----------  Left atrium                            Value        Reference LA ID, A-P, ES                         33    mm     ---------- LA ID/bsa, A-P                         1.9   cm/m^2 <=2.2 LA volume, [name]                           40.1  ml     ---------- LA volume/bsa, [name]                       23.1  ml/m^2 ---------- LA volume, ES, 1-p A4C                 36.4  ml     ---------- LA volume/bsa, ES, 1-p A4C             21    ml/m^2 ---------- LA volume, ES, 1-p A2C                 42    ml     ---------- LA volume/bsa, ES, 1-p A2C             24.2  ml/m^2 ----------  Mitral valve                           Value        Reference Mitral E-wave peak velocity            79.5  cm/[name]   ---------- Mitral A-wave peak velocity            49.5  cm/[name]   ---------- Mitral deceleration time       (H)     274   ms     150 - 230 Mitral peak gradient, D                3     mm Hg   ---------- Mitral E/A ratio, peak  1.6          ----------  Pulmonary arteries                     Value        Reference PA pressure, [name], DP                     22    mm Hg  <=30  Tricuspid valve                        Value        Reference Tricuspid regurg peak velocity         219   cm/[name]   ---------- Tricuspid peak RV-RA gradient          19    mm Hg  ----------  Right atrium                           Value        Reference RA ID, [name]-I, ES, A4C                    45.1  mm     34 - 49 RA area, ES, A4C                       12.2  cm^2   8.3 - 19.5 RA volume, ES, A/L                     27.7  ml     ---------- RA volume/bsa, ES, A/L                 16    ml/m^2 ----------  Systemic veins                         Value        Reference Estimated CVP                          3     mm Hg  ----------  Right ventricle                        Value        Reference TAPSE                                  19.6  mm     ---------- RV pressure, [name], DP                     22    mm Hg  <=30 RV [name]&', lateral, [name]                      10.2  cm/[name]   ----------  Legend: (L)  and  (H)  mark values outside specified reference range.  ------------------------------------------------------------------- Prepared and Electronically Authenticated by  Nicholes Mango, MD 2018-01-20T16:22:18            Recent Labs: 01/14/2023: ALT 11; BUN 13; Creatinine, Ser 1.16; Hemoglobin 12.0; Platelets 218; Potassium 3.7; Sodium 138  Recent Lipid Panel    Component Value Date/Time   CHOL CANCELED 09/01/2021 1137   TRIG  CANCELED 09/01/2021 1137   HDL CANCELED 09/01/2021 1137   CHOLHDL 3.1 01/20/2021 1117   CHOLHDL 2.4 09/11/2018 0354   VLDL 18 09/11/2018 0354   LDLCALC 111 (H) 01/20/2021 1117   LDLDIRECT 70 03/24/2022 0944    History of Present Illness    82 year old male with the above past medical history including CAD [name]/p NSTEMI, DES-p-mLCx in 2018,  hyperlipidemia, BPH, GERD, and dementia.   He  has a history of low risk stress test in 2015.  He was hospitalized in 2018 in setting of NSTEMI.  Cardiac catheterization revealed 100% proximal to mid left circumflex stenosis, [name]/p DES.  EF was normal at the time.  Myoview in 2019 was negative for ischemia. He presented to the ED on 01/14/2023 with complaints of exertional chest pain.  Troponin was negative, EKG was without significant change, cardiology was consulted.  He was started on Imdur 60 mg daily.  Plavix was discontinued. Outpatient Lexiscan Myoview in 01/2023 showed no evidence of ischemia, fixed mild defect in inferior/inferolateral wall most consistent with prior infarction.  He was last seen in the office on 02/06/2023 and was stable from a cardiac standpoint.  He noted mild fleeting intermittent chest discomfort, overall improved with Imdur.   He presents today for follow-up accompanied by his daughter.  Since his last visit he has done well from a cardiac standpoint.  He denies any symptoms concerning for angina.  He is able to mow his yard without difficulty.  Overall, he reports feeling well.  Home Medications    Current Outpatient Medications  Medication Sig Dispense Refill   aspirin 81 MG chewable tablet Chew 1 tablet (81 mg total) by mouth daily. 90 tablet 3   Blood Pressure Monitoring (COMFORT TOUCH BP CUFF/MEDIUM) MISC Use daily 1 each 0   docusate sodium (COLACE) 100 MG capsule Take 1-2 every few days as needed 30 capsule 3   loratadine (CLARITIN) 10 MG tablet Take 10 mg by mouth daily as needed for allergies.     memantine (NAMENDA) 10 MG tablet Take 1 tab twice daily. (Patient taking differently: Take 10 mg by mouth 2 (two) times daily.) 180 tablet 2   pantoprazole (PROTONIX) 40 MG tablet Take one daily as needed (Patient taking differently: Take 40 mg by mouth daily as needed (for allergies).) 90 tablet 1   pravastatin (PRAVACHOL) 80 MG tablet Take 1 tablet (80 mg total) by mouth at bedtime. 90 tablet 3   tamsulosin (FLOMAX)  0.4 MG CAPS capsule TAKE 1 CAPSULE(0.4 MG) BY MOUTH DAILY AFTER SUPPER (Patient taking differently: Take 0.4 mg by mouth daily after supper.) 90 capsule 3   donepezil (ARICEPT) 5 MG tablet Take 0.5 tablets (2.5 mg total) by mouth at bedtime. (Patient not taking: Reported on 05/09/2023) 15 tablet 1   isosorbide mononitrate (IMDUR) 60 MG 24 hr tablet TAKE 1 TABLET(60 MG) BY MOUTH DAILY 90 tablet 3   nitroGLYCERIN (NITROSTAT) 0.4 MG SL tablet Place 1 tablet (0.4 mg total) under the tongue every 5 (five) minutes x 3 doses as needed for chest pain. 25 tablet 12   No current facility-administered medications for this visit.     Review of Systems    He denies chest pain, palpitations, dyspnea, pnd, orthopnea, n, v, dizziness, syncope, edema, weight gain, or early satiety. All other systems reviewed and are otherwise negative except as noted above.   Physical Exam    VS:  BP 110/62 (BP Location: Left Arm, Patient Position: Sitting, Cuff Size: Normal)  Pulse 62   Ht 5\' 5"  (1.651 m)   Wt 155 lb 9.6 oz (70.6 kg)   SpO2 97%   BMI 25.89 kg/m   GEN: Well nourished, well developed, in no acute distress. HEENT: normal. Neck: Supple, no JVD, carotid bruits, or masses. Cardiac: RRR, no murmurs, rubs, or gallops. No clubbing, cyanosis, edema.  Radials/DP/PT 2+ and equal bilaterally.  Respiratory:  Respirations regular and unlabored, clear to auscultation bilaterally. GI: Soft, nontender, nondistended, BS + x 4. MS: no deformity or atrophy. Skin: warm and dry, no rash. Neuro:  Strength and sensation are intact. Psych: Normal affect.  Accessory Clinical Findings    ECG personally reviewed by me today - No EKG in office today.     Lab Results  Component Value Date   WBC 6.3 01/14/2023   HGB 12.0 (L) 01/14/2023   HCT 38.6 (L) 01/14/2023   MCV 89.4 01/14/2023   PLT 218 01/14/2023   Lab Results  Component Value Date   CREATININE 1.16 01/14/2023   BUN 13 01/14/2023   NA 138 01/14/2023   K 3.7  01/14/2023   CL 105 01/14/2023   CO2 27 01/14/2023   Lab Results  Component Value Date   ALT 11 01/14/2023   AST 18 01/14/2023   ALKPHOS 80 01/14/2023   BILITOT 0.6 01/14/2023   Lab Results  Component Value Date   CHOL CANCELED 09/01/2021   HDL CANCELED 09/01/2021   LDLCALC 111 (H) 01/20/2021   LDLDIRECT 70 03/24/2022   TRIG CANCELED 09/01/2021   CHOLHDL 3.1 01/20/2021    Lab Results  Component Value Date   HGBA1C 5.3 08/07/2014    Assessment & Plan    1. CAD/chest pain: [name]/p NSTEMI, DES-p-mLCx in 2018.  Recent ED visit with complaints of exertional chest pain, troponin was negative, EKG was stable.  He was started on Imdur.  Lexiscan Myoview in 01/2023 showed no evidence of ischemia, fixed mild defect in the inferior/inferolateral wall consistent with prior infarct. Stable with no anginal symptoms. No indication for ischemic evaluation.  Continue aspirin, Imdur, pravastatin.   2. Hyperlipidemia:  dLDL was 70 in 03/2022.  Monitored and managed per PCP. He is due for repeat fasting lipid panel, LFTs.  Continue Pravastatin.    3. Disposition: Follow-up in 6 months with Dr. Allyson Sabal.       Joylene Grapes, NP 05/09/2023, 2:07 PM

## 2023-05-09 NOTE — Patient Instructions (Signed)
Medication Instructions:  Your physician recommends that you continue on your current medications as directed. Please refer to the Current Medication list given to you today.   *If you need a refill on your cardiac medications before your next appointment, please call your pharmacy*   Lab Work: NONE ordered at this time of appointment   If you have labs (blood work) drawn today and your tests are completely normal, you will receive your results only by: MyChart Message (if you have MyChart) OR A paper copy in the mail If you have any lab test that is abnormal or we need to change your treatment, we will call you to review the results.   Testing/Procedures: NONE ordered at this time of appointment     Follow-Up: At [facility], you and your health needs are our priority.  As part of our continuing mission to provide you with exceptional heart care, we have created designated Provider Care Teams.  These Care Teams include your primary Cardiologist (physician) and Advanced Practice Providers (APPs -  Physician Assistants and Nurse Practitioners) who all work together to provide you with the care you need, when you need it.  We recommend signing up for the patient portal called "MyChart".  Sign up information is provided on this After Visit Summary.  MyChart is used to connect with patients for Virtual Visits (Telemedicine).  Patients are able to view lab/test results, encounter notes, upcoming appointments, etc.  Non-urgent messages can be sent to your provider as well.   To learn more about what you can do with MyChart, go to [email or web address].    Your next appointment:   6 month(s)  Provider:   Jonathan Berry, MD     Other Instructions   

## 2023-09-15 ENCOUNTER — Emergency Department (HOSPITAL_COMMUNITY): Payer: Medicare HMO

## 2023-09-15 ENCOUNTER — Encounter (HOSPITAL_COMMUNITY): Payer: Self-pay

## 2023-09-15 ENCOUNTER — Other Ambulatory Visit: Payer: Self-pay

## 2023-09-15 ENCOUNTER — Emergency Department (HOSPITAL_COMMUNITY)
Admission: EM | Admit: 2023-09-15 | Discharge: 2023-09-15 | Disposition: A | Discharge status: Home / Self Care | Payer: Medicare HMO | Attending: Emergency Medicine | Admitting: Emergency Medicine

## 2023-09-15 DIAGNOSIS — Y92410 Unspecified street and highway as the place of occurrence of the external cause: Secondary | ICD-10-CM | POA: Diagnosis not present

## 2023-09-15 DIAGNOSIS — Z7982 Long term (current) use of aspirin: Secondary | ICD-10-CM | POA: Insufficient documentation

## 2023-09-15 DIAGNOSIS — S161XXA Strain of muscle, fascia and tendon at neck level, initial encounter: Secondary | ICD-10-CM | POA: Insufficient documentation

## 2023-09-15 DIAGNOSIS — M47812 Spondylosis without myelopathy or radiculopathy, cervical region: Secondary | ICD-10-CM | POA: Diagnosis not present

## 2023-09-15 DIAGNOSIS — R079 Chest pain, unspecified: Secondary | ICD-10-CM | POA: Diagnosis not present

## 2023-09-15 DIAGNOSIS — S199XXA Unspecified injury of neck, initial encounter: Secondary | ICD-10-CM | POA: Diagnosis not present

## 2023-09-15 DIAGNOSIS — M4802 Spinal stenosis, cervical region: Secondary | ICD-10-CM | POA: Diagnosis not present

## 2023-09-15 DIAGNOSIS — S20219A Contusion of unspecified front wall of thorax, initial encounter: Secondary | ICD-10-CM | POA: Insufficient documentation

## 2023-09-15 DIAGNOSIS — R0789 Other chest pain: Secondary | ICD-10-CM | POA: Diagnosis not present

## 2023-09-15 DIAGNOSIS — I6529 Occlusion and stenosis of unspecified carotid artery: Secondary | ICD-10-CM | POA: Diagnosis not present

## 2023-09-15 MED ORDER — ACETAMINOPHEN 500 MG PO TABS
1000.0000 mg | ORAL_TABLET | Freq: Once | ORAL | Status: AC
  Administered 2023-09-15: 1000 mg via ORAL
  Filled 2023-09-15: qty 2
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
## 2023-09-15 NOTE — Discharge Instructions (Addendum)
It was our pleasure to provide your ER care today - we hope that you feel better.  Take acetaminophen or ibuprofen as need.   Follow up with primary care doctor in 1-2 weeks if symptoms fail to improve/resolve.  Return to ER if worse, new symptoms, new/severe pain, severe headache, vomiting, trouble breathing, abdominal pain, or other concern.

## 2023-11-22 ENCOUNTER — Ambulatory Visit: Payer: Medicare HMO | Admitting: Family Medicine

## 2023-11-22 NOTE — Patient Instructions (Incomplete)
   I will miss working with you - Be Well

## 2023-11-22 NOTE — Progress Notes (Unsigned)
    SUBJECTIVE:   CHIEF COMPLAINT / HPI:   Dementia Followed by neurology?  BP  CAD Saw cadiology in May is doing well  Lung Cancer screening??  GOC  PERTINENT  PMH / PSH: *** Patient Active Problem List   Diagnosis Date Noted   Chest pain 01/14/2023   Hypotension 09/17/2019   Bradycardia    CAD S/P percutaneous coronary angioplasty 09/10/2018   Dementia (HCC) 03/27/2018   NSTEMI (non-ST elevated myocardial infarction) (HCC) 12/30/2016   Dyslipidemia 02/04/2015   BPH (benign prostatic hyperplasia) 10/15/2014   GERD (gastroesophageal reflux disease) 08/07/2014    Current Outpatient Medications  Medication Instructions   aspirin 81 mg, Oral, Daily   Blood Pressure Monitoring (COMFORT TOUCH BP CUFF/MEDIUM) MISC Use daily   docusate sodium (COLACE) 100 MG capsule Take 1-2 every few days as needed   donepezil (ARICEPT) 2.5 mg, Oral, Daily at bedtime   isosorbide mononitrate (IMDUR) 60 MG 24 hr tablet TAKE 1 TABLET(60 MG) BY MOUTH DAILY   loratadine (CLARITIN) 10 mg, Oral, Daily PRN   memantine (NAMENDA) 10 MG tablet Take 1 tab twice daily.   nitroGLYCERIN (NITROSTAT) 0.4 mg, Sublingual, Every 5 min x3 PRN   pantoprazole (PROTONIX) 40 MG tablet Take one daily as needed   pravastatin (PRAVACHOL) 80 mg, Oral, Daily at bedtime   tamsulosin (FLOMAX) 0.4 MG CAPS capsule TAKE 1 CAPSULE(0.4 MG) BY MOUTH DAILY AFTER SUPPER       09/15/2023   11:43 PM 09/15/2023    7:26 PM 09/15/2023    3:34 PM  Vitals with BMI  Height   5\' 5"   Weight   155 lbs 10 oz  BMI   25.9  Systolic 120 107   Diastolic 75 65   Pulse 54 55       OBJECTIVE:   There were no vitals taken for this visit.  ***  ASSESSMENT/PLAN:   There are no diagnoses linked to this encounter.   There are no Patient Instructions on file for this visit.   Carney Living, MD [facility]

## 2023-11-23 ENCOUNTER — Ambulatory Visit (INDEPENDENT_AMBULATORY_CARE_PROVIDER_SITE_OTHER): Payer: Medicare HMO | Admitting: Family Medicine

## 2023-11-23 VITALS — BP 139/60 | HR 51 | Ht 65.0 in | Wt 155.6 lb

## 2023-11-23 DIAGNOSIS — R220 Localized swelling, mass and lump, head: Secondary | ICD-10-CM | POA: Diagnosis not present

## 2023-11-23 DIAGNOSIS — K921 Melena: Secondary | ICD-10-CM | POA: Diagnosis not present

## 2023-11-23 DIAGNOSIS — R519 Headache, unspecified: Secondary | ICD-10-CM

## 2023-11-23 LAB — POCT SEDIMENTATION RATE: POCT SED RATE: 5 mm/h (ref 0–22)

## 2023-11-23 NOTE — Patient Instructions (Signed)
It was great to see you again today.  Checking marker of inflammation Checking blood counts as well  Try miralax or metamucil  Follow up with Dr. Deirdre Priest as scheduled  If blood in stool returns or worsens please contact us If lip swelling worsens or trouble breathing go to ER  Be well, Dr. Pollie Meyer

## 2023-11-23 NOTE — Progress Notes (Signed)
  Date of Visit: 11/23/2023   SUBJECTIVE:   HPI:  Dale [name] presents today for a same day appointment to discuss headache, blood in stool, lips swelling. Accompanied by his daughter who helps provide the history.   Headache - noted for the last week. Pain near ears bilaterally, radiates upwards on his head. Yesterday took him to the dentist to have his dentures adjusted as thought this could be contributing. Also planning to get glasses adjusted. Pain is not every day, just some days. Currently no pain. Tried tylenol which helped him feel better.  Blood in stool - noticed some blood in stool several months ago, just a little bit. Has bowel movement about 2-3 times per week. No recurrence of blood recently. Last colonoscopy reviewed from 2010 - normal colon without lesions, no biopsies taken.  Lip swelling - since going to dentist yesterday about 12p, later that night noted upper and lower lips swollen. No pain, itching, vomiting, trouble breathing, rashes, or swelling of tongue. Daughter gave him benadryl which maybe helped some. This has occurred in the past though previously not connected to dental care, never knew trigger.  PMHx: history CAD (prior STEMI) with stent, GERD, dementia, hyperlipidemia, BPH  OBJECTIVE:   BP 139/60   Pulse (!) 51   Ht 5\' 5"  (1.651 m)   Wt 155 lb 9.6 oz (70.6 kg)   SpO2 100%   BMI 25.89 kg/m  Gen: no acute distress, pleasant, cooperative, well appearing HEENT: normocephalic, atraumatic. +swelling of upper and lower lips. No drooling. Speaking normally. No swelling of tongue or sublingual area.  Heart: regular rate and rhythm, no murmur Lungs: clear to auscultation bilaterally, normal work of breathing on room air Neuro: alert, grossly nonfocal, speech normal Abdomen: soft, nontender to palpation, no masses or organomegaly. Ext: no edema   ASSESSMENT/PLAN:   Assessment & Plan Nonintractable headache, unspecified chronicity pattern, unspecified headache  type Not certain cause. Normal neuro exam today. Given bilateral nature near temples check ESR to rule out temporal arteritis, though low suspicion clinically. Reasonable to adjust glasses, daughter planning to get this done. Follow up if not improving or worsening. Blood in stool Etiology not clear by history, may be hemorrhoid, fissure, or other issue. Somewhat reassuring that it has not recurred recently. Discussed workup options including rectal exam today with anoscopy or referral to GI today. Patient and daughter defer any workup today, including rectal exam. Discussed that deferring evaluation does mean we could be missing something serious like cancer. They will return if it recurs. Check CBC today. As constipation likely contributing discussed trial of miralax/metamucil. Follow up with PCP as scheduled later this month. Lip swelling Appears to be some reaction to something used at dental office No signs of anaphylaxis or difficulty breathing Offered to rx prednisone, they decline this Advised to monitor symptoms, go to ED if worsening or difficulty breathing Discussed risks of benadryl in older adults, recommend avoiding if possible, daughter appreciative   Declines flu and COVID vaccines today   [place] J. Pollie Meyer, MD [facility]

## 2023-11-24 LAB — CBC
Hematocrit: 42 % (ref 37.5–51.0)
Hemoglobin: 12.8 g/dL — ABNORMAL LOW (ref 13.0–17.7)
MCH: 27 pg (ref 26.6–33.0)
MCHC: 30.5 g/dL — ABNORMAL LOW (ref 31.5–35.7)
MCV: 89 fL (ref 79–97)
Platelets: 209 10*3/uL (ref 150–450)
RBC: 4.74 x10E6/uL (ref 4.14–5.80)
RDW: 11.7 % (ref 11.6–15.4)
WBC: 6.3 10*3/uL (ref 3.4–10.8)

## 2023-11-27 ENCOUNTER — Encounter: Payer: Self-pay | Admitting: Cardiovascular Disease

## 2023-11-27 ENCOUNTER — Ambulatory Visit: Payer: Medicare HMO | Attending: Cardiovascular Disease | Admitting: Cardiovascular Disease

## 2023-11-27 VITALS — BP 130/60 | HR 68 | Ht 65.0 in | Wt 152.0 lb

## 2023-11-27 DIAGNOSIS — I214 Non-ST elevation (NSTEMI) myocardial infarction: Secondary | ICD-10-CM | POA: Diagnosis not present

## 2023-11-27 DIAGNOSIS — E785 Hyperlipidemia, unspecified: Secondary | ICD-10-CM | POA: Diagnosis not present

## 2023-11-27 NOTE — Assessment & Plan Note (Signed)
History of non-STEMI status post cardiac catheterization performed by myself via the right radial approach 12/30/2016 revealing an occluded circumflex in the proximal AV groove which I stented using Medtronic drug-eluting stent.  The remainder of his anatomy was free of disease and his LV function was normal.  He is completely asymptomatic and remains on DAPT.

## 2023-11-27 NOTE — Progress Notes (Signed)
11/27/2023 Dale TWOREK Sr.   [birth year]  [id number]  Primary Physician Carney Living, MD Primary Cardiologist: Runell Gess MD Nicholes Calamity, [place]  HPI:  Dale Horn Sr. is a 82 y.o.  thin appearing widowed African-American male father of 2 children, grandfather of 3 grandchildren who is accompanied by his daughter Dale [name] today.  I last saw him in the office 09/27/2019.  I first met him at the time of his non-STEMI 12/30/16. 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 The remainder of his coronary anatomy was free of significant disease and his LV function was normal. His other problems include a history of hyperlipidemia and remote tobacco abuse. He was discharged home 2 days later.   Since I saw him almost 3 years ago he is remained stable.  He denies chest pain or shortness of breath.  He still rides his bike and does yard work, mowing his lawn, without symptoms.   Current Meds  Medication Sig   aspirin 81 MG chewable tablet Chew 1 tablet (81 mg total) by mouth daily.   Blood Pressure Monitoring (COMFORT TOUCH BP CUFF/MEDIUM) MISC Use daily   clopidogrel (PLAVIX) 75 MG tablet Take 75 mg by mouth daily.   docusate sodium (COLACE) 100 MG capsule Take 1-2 every few days as needed   isosorbide mononitrate (IMDUR) 60 MG 24 hr tablet TAKE 1 TABLET(60 MG) BY MOUTH DAILY   loratadine (CLARITIN) 10 MG tablet Take 10 mg by mouth daily as needed for allergies.   memantine (NAMENDA) 10 MG tablet Take 1 tab twice daily. (Patient taking differently: Take 10 mg by mouth 2 (two) times daily.)   nitroGLYCERIN (NITROSTAT) 0.4 MG SL tablet Place 1 tablet (0.4 mg total) under the tongue every 5 (five) minutes x 3 doses as needed for chest pain.   pantoprazole (PROTONIX) 40 MG tablet Take one daily as needed (Patient taking differently:  Take 40 mg by mouth daily as needed (for allergies).)   pravastatin (PRAVACHOL) 80 MG tablet Take 1 tablet (80 mg total) by mouth at bedtime.   tamsulosin (FLOMAX) 0.4 MG CAPS capsule TAKE 1 CAPSULE(0.4 MG) BY MOUTH DAILY AFTER SUPPER (Patient taking differently: Take 0.4 mg by mouth daily after supper.)     No Known Allergies  Social History   Socioeconomic History   Marital status: Widowed    Spouse name: Not on file   Number of children: 2   Years of education: 6th   Highest education level: 5th grade  Occupational History   Occupation: Retired  Tobacco Use   Smoking status: Former    Current packs/day: 0.00    Average packs/day: 1 pack/day for 20.0 years (20.0 ttl pk-yrs)    Types: Cigarettes    Start date: 10/23/1991    Quit date: 10/23/2011    Years since quitting: 12.1   Smokeless tobacco: Never  Vaping Use   Vaping status: Never Used  Substance and Sexual Activity   Alcohol use: No    Comment: occ   Drug use: Not Currently   Sexual activity: Not Currently    Comment: Patient would like to be. Discussed wearing condom if becomes sexually active  Other Topics Concern   Not on file  Social History Narrative   Patient lives in his home with his daughter.   Patient is a widow ~ 75  years now.   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    Patient enjoys gardening and staying active outside.    Patient enjoys spending time with his family and going to the restaurant down the street.   Social Drivers of Corporate investment banker Strain: Low Risk  (10/29/2020)   Overall Financial Resource Strain (CARDIA)    Difficulty of Paying Living Expenses: Not hard at all  Food Insecurity: No Food Insecurity (01/14/2023)   Hunger Vital Sign    Worried About Running Out of Food in the Last Year: Never true    Ran Out of Food in the Last Year: Never true  Transportation Needs: No Transportation Needs (01/14/2023)   PRAPARE - Therapist, art (Medical): No    Lack of Transportation (Non-Medical): No  Physical Activity: Sufficiently Active (10/29/2020)   Exercise Vital Sign    Days of Exercise per Week: 7 days    Minutes of Exercise per Session: 60 min  Stress: No Stress Concern Present (10/29/2020)   [employer] of Occupational Health - Occupational Stress Questionnaire    Feeling of Stress : Only a little  Social Connections: Socially Isolated (10/29/2020)   Social Connection and Isolation Panel [NHANES]    Frequency of Communication with Friends and Family: More than three times a week    Frequency of Social Gatherings with Friends and Family: More than three times a week    Attends Religious Services: Never    Database administrator or Organizations: No    Attends Banker Meetings: Never    Marital Status: Widowed  Intimate Partner Violence: Not At Risk (10/29/2020)   Humiliation, Afraid, Rape, and Kick questionnaire    Fear of Current or Ex-Partner: No    Emotionally Abused: No    Physically Abused: No    Sexually Abused: No     Review of Systems: General: negative for chills, fever, night sweats or weight changes.  Cardiovascular: negative for chest pain, dyspnea on exertion, edema, orthopnea, palpitations, paroxysmal nocturnal dyspnea or shortness of breath Dermatological: negative for rash Respiratory: negative for cough or wheezing Urologic: negative for hematuria Abdominal: negative for nausea, vomiting, diarrhea, bright red blood per rectum, melena, or hematemesis Neurologic: negative for visual changes, syncope, or dizziness All other systems reviewed and are otherwise negative except as noted above.    Blood pressure 130/60, pulse 68, height 5\' 5"  (1.651 m), weight 152 lb (68.9 kg).  General appearance: alert and no distress Neck: no adenopathy, no carotid bruit, no JVD, supple, symmetrical, trachea midline, and thyroid not enlarged, symmetric, no  tenderness/mass/nodules Lungs: clear to auscultation bilaterally Heart: regular rate and rhythm, S1, S2 normal, no murmur, click, rub or gallop Extremities: extremities normal, atraumatic, no cyanosis or edema Pulses: 2+ and symmetric Skin: Skin color, texture, turgor normal. No rashes or lesions Neurologic: Grossly normal  EKG not performed today      ASSESSMENT AND PLAN:   Dyslipidemia History of dyslipidemia on pravastatin followed by his PCP.  NSTEMI (non-ST elevated myocardial infarction) (HCC) History of non-STEMI status post cardiac catheterization performed by myself via the right radial approach 12/30/2016 revealing an occluded circumflex in the proximal AV groove which I stented using Medtronic drug-eluting stent.  The remainder of his anatomy was free of disease and his LV function was normal.  He is completely asymptomatic and remains on DAPT.     Runell Gess MD FACP,FACC,FAHA, [facility]  11/27/2023 10:38 AM

## 2023-11-27 NOTE — Assessment & Plan Note (Signed)
History of dyslipidemia on pravastatin followed by his PCP.

## 2023-11-27 NOTE — Patient Instructions (Signed)
Medication Instructions:  Your physician recommends that you continue on your current medications as directed. Please refer to the Current Medication list given to you today.  *If you need a refill on your cardiac medications before your next appointment, please call your pharmacy*   Follow-Up: At [facility], you and your health needs are our priority.  As part of our continuing mission to provide you with exceptional heart care, we have created designated Provider Care Teams.  These Care Teams include your primary Cardiologist (physician) and Advanced Practice Providers (APPs -  Physician Assistants and Nurse Practitioners) who all work together to provide you with the care you need, when you need it.  We recommend signing up for the patient portal called "MyChart".  Sign up information is provided on this After Visit Summary.  MyChart is used to connect with patients for Virtual Visits (Telemedicine).  Patients are able to view lab/test results, encounter notes, upcoming appointments, etc.  Non-urgent messages can be sent to your provider as well.   To learn more about what you can do with MyChart, go to [email or web address].    Your next appointment:   6 month(s)  Provider:   Marjie Skiff, PA-C, Robet Leu, PA-C, Azalee Course, PA-C, Bernadene Person, NP, or Reather Littler, NP       Then, Nanetta Batty, MD will plan to see you again in 12 month(s).

## 2023-12-04 ENCOUNTER — Other Ambulatory Visit: Payer: Self-pay

## 2023-12-04 MED ORDER — PRAVASTATIN SODIUM 80 MG PO TABS: 80.0000 mg | ORAL_TABLET | Freq: Every day | ORAL | 3 refills | Status: DC

## 2023-12-05 ENCOUNTER — Encounter (HOSPITAL_COMMUNITY): Payer: Self-pay | Admitting: Emergency Medicine

## 2023-12-05 ENCOUNTER — Ambulatory Visit (HOSPITAL_COMMUNITY)
Admission: EM | Admit: 2023-12-05 | Discharge: 2023-12-05 | Disposition: A | Discharge status: Home / Self Care | Payer: Medicare HMO | Attending: Nurse Practitioner | Admitting: Nurse Practitioner

## 2023-12-05 DIAGNOSIS — H00012 Hordeolum externum right lower eyelid: Secondary | ICD-10-CM | POA: Diagnosis not present

## 2023-12-05 DIAGNOSIS — H5711 Ocular pain, right eye: Secondary | ICD-10-CM | POA: Diagnosis not present

## 2023-12-05 MED ORDER — ERYTHROMYCIN 5 MG/GM OP OINT: TOPICAL_OINTMENT | Freq: Four times a day (QID) | OPHTHALMIC | 0 refills | Status: AC

## 2023-12-05 NOTE — ED Triage Notes (Signed)
Pt c/o right headache, right eye burning and draining for 2 weeks

## 2023-12-05 NOTE — ED Provider Notes (Signed)
[facility]    CSN: [id number] Arrival date & time: 12/05/23  1761      History   Chief Complaint Chief Complaint  Patient presents with   Eye Pain    HPI Dale BLUE Sr. is a 82 y.o. male.   Patient presents today with male caregiver for 2-week history of right sided headache, right eye burning, and discolored drainage for the past couple of weeks.  Patient is a poor historian due to dementia.  Caregiver reports he does not wear contacts.  He has an eye appointment coming up January 7.  Has not been applying any eyedrops to his eye, but is washing his eye "thoroughly".    Past Medical History:  Diagnosis Date   Coronary artery disease    Dementia (HCC)    GERD (gastroesophageal reflux disease)    Hyperlipemia    Leg mass    rt   Memory loss    Myocardial infarction less than 4 weeks ago (HCC) 12/29/2016   Prostate disease    Quit smoking 11/20/2008   Qualifier: Diagnosis of  By: Deirdre Priest MD, Gray Bernhardt angioplasty with stent to LCX     Patient Active Problem List   Diagnosis Date Noted   Chest pain 01/14/2023   Hypotension 09/17/2019   Bradycardia    CAD S/P percutaneous coronary angioplasty 09/10/2018   Dementia (HCC) 03/27/2018   NSTEMI (non-ST elevated myocardial infarction) (HCC) 12/30/2016   Dyslipidemia 02/04/2015   BPH (benign prostatic hyperplasia) 10/15/2014   GERD (gastroesophageal reflux disease) 08/07/2014    Past Surgical History:  Procedure Laterality Date   CARDIAC CATHETERIZATION N/A 12/30/2016   Procedure: Coronary Stent Intervention;  Surgeon: Runell Gess, MD;  Location: [facility];  Service: Cardiovascular;  Laterality: N/A;   CARDIAC CATHETERIZATION N/A 12/30/2016   Procedure: Coronary Angiography;  Surgeon: Runell Gess, MD;  Location: [facility];  Service: Cardiovascular;  Laterality: N/A;   CARDIAC CATHETERIZATION N/A 12/30/2016   Procedure: Coronary Balloon Angioplasty;  Surgeon: Runell Gess, MD;  Location: [facility];  Service: Cardiovascular;  Laterality: N/A;   CORONARY STENT PLACEMENT         Home Medications    Prior to Admission medications   Medication Sig Start Date End Date Taking? Authorizing Provider  erythromycin ophthalmic ointment Place into the right eye 4 (four) times daily for 5 days. Place a 1/2 inch ribbon of ointment into the lower eyelid. 12/05/23 12/10/23 Yes Valentino Nose, NP  aspirin 81 MG chewable tablet Chew 1 tablet (81 mg total) by mouth daily. 12/08/20   Shirlean Mylar, MD  Blood Pressure Monitoring (COMFORT TOUCH BP CUFF/MEDIUM) MISC Use daily 01/24/23   Carney Living, MD  clopidogrel (PLAVIX) 75 MG tablet Take 75 mg by mouth daily.    [provider]  docusate sodium (COLACE) 100 MG capsule Take 1-2 every few days as needed 01/24/23   Carney Living, MD  isosorbide mononitrate (IMDUR) 60 MG 24 hr tablet TAKE 1 TABLET(60 MG) BY MOUTH DAILY 05/09/23   Joylene Grapes, NP  loratadine (CLARITIN) 10 MG tablet Take 10 mg by mouth daily as needed for allergies.    [provider]  memantine (NAMENDA) 10 MG tablet Take 1 tab twice daily. Patient taking differently: Take 10 mg by mouth 2 (two) times daily. 11/28/22   Carney Living, MD  nitroGLYCERIN (NITROSTAT) 0.4 MG SL tablet Place 1 tablet (0.4  mg total) under the tongue every 5 (five) minutes x 3 doses as needed for chest pain. 05/09/23   Joylene Grapes, NP  pantoprazole (PROTONIX) 40 MG tablet Take one daily as needed Patient taking differently: Take 40 mg by mouth daily as needed (for allergies). 08/29/22   Carney Living, MD  pravastatin (PRAVACHOL) 80 MG tablet Take 1 tablet (80 mg total) by mouth at bedtime. 12/04/23   Carney Living, MD  tamsulosin (FLOMAX) 0.4 MG CAPS capsule TAKE 1 CAPSULE(0.4 MG) BY MOUTH DAILY AFTER SUPPER Patient taking differently: Take 0.4 mg by mouth daily after supper. 11/28/22   Carney Living, MD    Family History Family History  Problem Relation Age of Onset   Cancer Mother        Unsure of type   Heart attack Father    Alzheimer's disease Father     Social History Social History   Tobacco Use   Smoking status: Former    Current packs/day: 0.00    Average packs/day: 1 pack/day for 20.0 years (20.0 ttl pk-yrs)    Types: Cigarettes    Start date: 10/23/1991    Quit date: 10/23/2011    Years since quitting: 12.1   Smokeless tobacco: Never  Vaping Use   Vaping status: Never Used  Substance Use Topics   Alcohol use: No    Comment: occ   Drug use: Not Currently     Allergies   Patient has no known allergies.   Review of Systems Review of Systems Per HPI  Physical Exam Triage Vital Signs ED Triage Vitals  Encounter Vitals Group     BP 12/05/23 0907 (!) 147/80     Systolic BP Percentile --      Diastolic BP Percentile --      Pulse Rate 12/05/23 0907 (!) 56     Resp 12/05/23 0907 16     Temp 12/05/23 0907 97.9 F (36.6 C)     Temp Source 12/05/23 0907 Oral     SpO2 12/05/23 0907 97 %     Weight --      Height --      Head Circumference --      Peak Flow --      Pain Score 12/05/23 0906 3     Pain Loc --      Pain Education --      Exclude from Growth Chart --    No data found.  Updated Vital Signs BP (!) 147/80 (BP Location: Left Arm)   Pulse (!) 56   Temp 97.9 F (36.6 C) (Oral)   Resp 16   SpO2 97%   Visual Acuity Right Eye Distance:   Left Eye Distance:   Bilateral Distance:    Right Eye Near:   Left Eye Near:    Bilateral Near:     Physical Exam Vitals and nursing note reviewed.  Constitutional:      General: He is not in acute distress.    Appearance: Normal appearance. He is not toxic-appearing.  HENT:     Head: Normocephalic and atraumatic.     Nose: Nose normal. No congestion or rhinorrhea.     Mouth/Throat:     Mouth: Mucous membranes are moist.     Pharynx: Oropharynx is clear.  Eyes:     General:         Right eye: No discharge.        Left eye: No discharge.  Extraocular Movements: Extraocular movements intact.     Comments: Very small hordeolum noted to right lower internal eyelid  Pulmonary:     Effort: Pulmonary effort is normal. No respiratory distress.  Lymphadenopathy:     Cervical: No cervical adenopathy.  Skin:    General: Skin is warm and dry.     Coloration: Skin is not jaundiced or pale.     Findings: No erythema.  Neurological:     Mental Status: He is alert. Mental status is at baseline.  Psychiatric:        Behavior: Behavior is cooperative.      UC Treatments / Results  Labs (all labs ordered are listed, but only abnormal results are displayed) Labs Reviewed - No data to display  EKG   Radiology No results found.  Procedures Procedures (including critical care time)  Medications Ordered in UC Medications - No data to display  Initial Impression / Assessment and Plan / UC Course  I have reviewed the triage vital signs and the nursing notes.  Pertinent labs & imaging results that were available during my care of the patient were reviewed by me and considered in my medical decision making (see chart for details).   Patient is well-appearing, normotensive, afebrile, not tachycardic, not tachypneic, oxygenating well on room air.    1. Pain of right eye 2. Hordeolum externum of right lower eyelid Treat with erythromycin ointment 4 times daily for 5 days Recommend follow-up with eye doctor as planned, especially if symptoms are not improving  The patient was given the opportunity to ask questions.  All questions answered to their satisfaction.  The patient is in agreement to this plan.   Final Clinical Impressions(s) / UC Diagnoses   Final diagnoses:  Pain of right eye  Hordeolum externum of right lower eyelid     Discharge Instructions      Apply the eye ointment 4 times daily for 5 days to treat for infection/inflammation of the eye.  You can  also use warm compresses over the close eye to help with pain and swelling.  Follow-up with eye doctor if symptoms do not improve with treatment.    ED Prescriptions     Medication Sig Dispense Auth. Provider   erythromycin ophthalmic ointment Place into the right eye 4 (four) times daily for 5 days. Place a 1/2 inch ribbon of ointment into the lower eyelid. 3.5 g Valentino Nose, NP      PDMP not reviewed this encounter.   Valentino Nose, NP 12/05/23 [phone]

## 2023-12-05 NOTE — Discharge Instructions (Signed)
Apply the eye ointment 4 times daily for 5 days to treat for infection/inflammation of the eye.  You can also use warm compresses over the close eye to help with pain and swelling.  Follow-up with eye doctor if symptoms do not improve with treatment.

## 2023-12-11 ENCOUNTER — Ambulatory Visit: Payer: Medicare HMO | Admitting: Family Medicine

## 2023-12-11 ENCOUNTER — Encounter: Payer: Self-pay | Admitting: Family Medicine

## 2023-12-11 VITALS — BP 128/66 | HR 72 | Wt 152.8 lb

## 2023-12-11 DIAGNOSIS — G308 Other Alzheimer's disease: Secondary | ICD-10-CM | POA: Diagnosis not present

## 2023-12-11 DIAGNOSIS — N400 Enlarged prostate without lower urinary tract symptoms: Secondary | ICD-10-CM

## 2023-12-11 DIAGNOSIS — F02B0 Dementia in other diseases classified elsewhere, moderate, without behavioral disturbance, psychotic disturbance, mood disturbance, and anxiety: Secondary | ICD-10-CM

## 2023-12-11 DIAGNOSIS — K219 Gastro-esophageal reflux disease without esophagitis: Secondary | ICD-10-CM | POA: Diagnosis not present

## 2023-12-11 DIAGNOSIS — R220 Localized swelling, mass and lump, head: Secondary | ICD-10-CM

## 2023-12-11 MED ORDER — ZOSTER VAC RECOMB ADJUVANTED 50 MCG/0.5ML IM SUSR: INTRAMUSCULAR | 1 refills | Status: DC

## 2023-12-11 NOTE — Progress Notes (Signed)
    SUBJECTIVE:   CHIEF COMPLAINT / HPI:   Accompanied by his daughter [name] who is a CMA but currently not working as she cares for him.   Eye pain  Seen in UC on 12/24 given ointment for stye.  Feels is improvng  On 12/22 seen for Blood in stool - no further episodes using metamucil regularly Headache - has mild intermittent headaches but does not bother much now Lip swelling - has mild every now and then.  Not sure when last time.   No shortness of breath or throat swelling.  Take zyrtec daily  Dementia Daughter who accompanies him and who he stays with feels this is slowly worsening.  He has been driving but his car is not working now.  There is some disagreement between his daughter and son whether he should be driving. Taking namenda daily   Advanced Directives His family has completed and has a HCPOA designated   OBJECTIVE:   BP 128/66   Pulse 72   Wt 152 lb 12.8 oz (69.3 kg)   SpO2 100%   BMI 25.43 kg/m   Alert not very talkative Heart - Regular rate and rhythm.  No murmurs, gallops or rubs.    Lungs:  Normal respiratory effort, chest expands symmetrically. Lungs are clear to auscultation, no crackles or wheezes. Extremities:  No cyanosis, edema, or deformity noted with good range of motion of all major joints.   Eye - Pupils Equal Round Reactive to light, Extraocular movements intact, Fundi without hemorrhage or visible lesions, Conjunctiva without redness or discharge.  Slight swelling of inner lower R eyelid    ASSESSMENT/PLAN:   Gastroesophageal reflux disease without esophagitis Assessment & Plan: Mild.  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  We discussed how difficult decisions about driving and independence can be.  Offered to talk with his son if desired. Continue namenda as they feel it is helping     Benign prostatic hyperplasia, unspecified whether lower urinary tract symptoms present Assessment & Plan: Stable on flomax - will continue    Lip swelling Assessment & Plan: Unsure of exact description or cause.  Never associated with shortness of breath.  Not on ACE/ARB.  Continue Zyrtec and monitor for worsening   Other orders -     Zoster Vac Recomb Adjuvanted; Inject 0.5 ml IM and Repeat in 2 months  Dispense: 0.5 mL; Refill: 1     Patient Instructions  Things we discussed today:  Keep the appointment with the eye doctor  Get the flu shot when you can  I sent a prescription to your pharmacy for your Zoster vaccines to help prevent Shingles.  The shot may cause a sore arm and mild flu like symptoms for a few days.  You will need a second shot 2 months after your first.    If your son would like to talk to me leave a message   I have enjoyed working with you over the years.   Be Well    Carney Living, MD [facility]

## 2023-12-11 NOTE — Assessment & Plan Note (Signed)
Slowly worsening.  We discussed how difficult decisions about driving and independence can be.  Offered to talk with his son if desired. Continue namenda as they feel it is helping

## 2023-12-11 NOTE — Assessment & Plan Note (Signed)
Mild.  Taking PPI as needed

## 2023-12-11 NOTE — Assessment & Plan Note (Signed)
Stable on flomax - will continue

## 2023-12-11 NOTE — Assessment & Plan Note (Signed)
Unsure of exact description or cause.  Never associated with shortness of breath.  Not on ACE/ARB.  Continue Zyrtec and monitor for worsening

## 2023-12-11 NOTE — Patient Instructions (Addendum)
Things we discussed today:  Keep the appointment with the eye doctor  Get the flu shot when you can  I sent a prescription to your pharmacy for your Zoster vaccines to help prevent Shingles.  The shot may cause a sore arm and mild flu like symptoms for a few days.  You will need a second shot 2 months after your first.    If your son would like to talk to me leave a message   I have enjoyed working with you over the years.   Be Well

## 2023-12-19 DIAGNOSIS — Z0100 Encounter for examination of eyes and vision without abnormal findings: Secondary | ICD-10-CM | POA: Diagnosis not present

## 2023-12-19 DIAGNOSIS — H2513 Age-related nuclear cataract, bilateral: Secondary | ICD-10-CM | POA: Diagnosis not present

## 2023-12-19 DIAGNOSIS — H52223 Regular astigmatism, bilateral: Secondary | ICD-10-CM | POA: Diagnosis not present

## 2023-12-19 DIAGNOSIS — H524 Presbyopia: Secondary | ICD-10-CM | POA: Diagnosis not present

## 2024-01-24 ENCOUNTER — Emergency Department (HOSPITAL_COMMUNITY): Payer: Medicare HMO

## 2024-01-24 ENCOUNTER — Encounter (HOSPITAL_COMMUNITY): Payer: Self-pay | Admitting: Emergency Medicine

## 2024-01-24 ENCOUNTER — Emergency Department (HOSPITAL_COMMUNITY)
Admission: EM | Admit: 2024-01-24 | Discharge: 2024-01-24 | Disposition: A | Discharge status: Home / Self Care | Payer: Medicare HMO | Attending: Emergency Medicine | Admitting: Emergency Medicine

## 2024-01-24 ENCOUNTER — Other Ambulatory Visit: Payer: Self-pay

## 2024-01-24 DIAGNOSIS — R001 Bradycardia, unspecified: Secondary | ICD-10-CM | POA: Diagnosis not present

## 2024-01-24 DIAGNOSIS — R0789 Other chest pain: Secondary | ICD-10-CM | POA: Diagnosis not present

## 2024-01-24 DIAGNOSIS — R079 Chest pain, unspecified: Secondary | ICD-10-CM | POA: Diagnosis not present

## 2024-01-24 DIAGNOSIS — I10 Essential (primary) hypertension: Secondary | ICD-10-CM | POA: Diagnosis not present

## 2024-01-24 LAB — CBC
HCT: 44.3 % (ref 39.0–52.0)
Hemoglobin: 13.4 g/dL (ref 13.0–17.0)
MCH: 27 pg (ref 26.0–34.0)
MCHC: 30.2 g/dL (ref 30.0–36.0)
MCV: 89.3 fL (ref 80.0–100.0)
Platelets: 210 10*3/uL (ref 150–400)
RBC: 4.96 MIL/uL (ref 4.22–5.81)
RDW: 12.8 % (ref 11.5–15.5)
WBC: 6.5 10*3/uL (ref 4.0–10.5)
nRBC: 0 % (ref 0.0–0.2)

## 2024-01-24 LAB — BASIC METABOLIC PANEL
Anion gap: 10 (ref 5–15)
BUN: 11 mg/dL (ref 8–23)
CO2: 25 mmol/L (ref 22–32)
Calcium: 9 mg/dL (ref 8.9–10.3)
Chloride: 105 mmol/L (ref 98–111)
Creatinine, Ser: 1.14 mg/dL (ref 0.61–1.24)
GFR, Estimated: >60 mL/min (ref >60)
Glucose, Bld: 93 mg/dL (ref 70–99)
Potassium: 3.8 mmol/L (ref 3.5–5.1)
Sodium: 140 mmol/L (ref 135–145)

## 2024-01-24 LAB — TROPONIN I (HIGH SENSITIVITY)
Troponin I (High Sensitivity): 6 ng/L (ref <18)
Troponin I (High Sensitivity): 6 ng/L (ref <18)

## 2024-01-24 NOTE — ED Provider Notes (Signed)
 [place] EMERGENCY DEPARTMENT AT [facility] Provider Note   CSN: [id number] Arrival date & time: 01/24/24  1217     History  Chief Complaint  Patient presents with   Chest Pain    Dale [name] Sr. is a 83 y.o. male.  Patient to ED with daughter for evaluation of chest pain that started this morning. Daughter reports patient has dementia and is an unreliable historian, however, has consistently reported right sided chest pain that goes around to the back since this morning. History of NSTEMI in 2018 with stent x 1. No fever, cough, SOB, vomiting.   The history is provided by the patient. No language interpreter was used.  Chest Pain      Home Medications Prior to Admission medications   Medication Sig Start Date End Date Taking? Authorizing Provider  aspirin 81 MG chewable tablet Chew 1 tablet (81 mg total) by mouth daily. 12/08/20   Shirlean Mylar, MD  Blood Pressure Monitoring (COMFORT TOUCH BP CUFF/MEDIUM) MISC Use daily 01/24/23   Carney Living, MD  clopidogrel (PLAVIX) 75 MG tablet Take 75 mg by mouth daily.    [provider]  docusate sodium (COLACE) 100 MG capsule Take 1-2 every few days as needed 01/24/23   Carney Living, MD  isosorbide mononitrate (IMDUR) 60 MG 24 hr tablet TAKE 1 TABLET(60 MG) BY MOUTH DAILY Patient not taking: Reported on 12/11/2023 05/09/23   Joylene Grapes, NP  loratadine (CLARITIN) 10 MG tablet Take 10 mg by mouth daily as needed for allergies.    [provider]  memantine (NAMENDA) 10 MG tablet Take 1 tab twice daily. Patient taking differently: Take 10 mg by mouth 2 (two) times daily. 11/28/22   Carney Living, MD  nitroGLYCERIN (NITROSTAT) 0.4 MG SL tablet Place 1 tablet (0.4 mg total) under the tongue every 5 (five) minutes x 3 doses as needed for chest pain. 05/09/23   Joylene Grapes, NP  pantoprazole (PROTONIX) 40 MG tablet Take one daily as needed Patient not taking: Reported on  12/11/2023 08/29/22   Carney Living, MD  pravastatin (PRAVACHOL) 80 MG tablet Take 1 tablet (80 mg total) by mouth at bedtime. 12/04/23   Carney Living, MD  tamsulosin (FLOMAX) 0.4 MG CAPS capsule TAKE 1 CAPSULE(0.4 MG) BY MOUTH DAILY AFTER SUPPER Patient taking differently: Take 0.4 mg by mouth daily after supper. 11/28/22   Carney Living, MD  Zoster Vaccine Adjuvanted [facility]) injection Inject 0.5 ml IM and Repeat in 2 months 12/11/23   Carney Living, MD      Allergies    Patient has no known allergies.    Review of Systems   Review of Systems  Cardiovascular:  Positive for chest pain.    Physical Exam Updated Vital Signs BP 136/78   Pulse (!) 54   Temp 98.1 F (36.7 C) (Oral)   Resp 17   Ht 5' 5.5" (1.664 m)   Wt 68.9 kg   SpO2 100%   BMI 24.91 kg/m  Physical Exam Vitals and nursing note reviewed.  Constitutional:      Appearance: He is well-developed.  HENT:     Head: Normocephalic.  Cardiovascular:     Rate and Rhythm: Normal rate and regular rhythm.     Heart sounds: No murmur heard. Pulmonary:     Effort: Pulmonary effort is normal.     Breath sounds: Normal breath sounds. No wheezing, rhonchi or rales.  Chest:  Chest wall: No tenderness.  Abdominal:     General: Bowel sounds are normal.     Palpations: Abdomen is soft.     Tenderness: There is no abdominal tenderness. There is no guarding or rebound.  Musculoskeletal:        General: Normal range of motion.     Cervical back: Normal range of motion and neck supple.     Right lower leg: No edema.     Left lower leg: No edema.  Skin:    General: Skin is warm and dry.  Neurological:     General: No focal deficit present.     Mental Status: He is alert and oriented to person, place, and time.     ED Results / Procedures / Treatments   Labs (all labs ordered are listed, but only abnormal results are displayed) Labs Reviewed  BASIC METABOLIC PANEL  CBC  TROPONIN I  (HIGH SENSITIVITY)  TROPONIN I (HIGH SENSITIVITY)   Results for orders placed or performed during the hospital encounter of 01/24/24  Basic metabolic panel   Collection Time: 01/24/24  1:06 PM  Result Value Ref Range   Sodium 140 135 - 145 mmol/L   Potassium 3.8 3.5 - 5.1 mmol/L   Chloride 105 98 - 111 mmol/L   CO2 25 22 - 32 mmol/L   Glucose, Bld 93 70 - 99 mg/dL   BUN 11 8 - 23 mg/dL   Creatinine, Ser 1.61 0.61 - 1.24 mg/dL   Calcium 9.0 8.9 - 09.6 mg/dL   GFR, Estimated >04 >54 mL/min   Anion gap 10 5 - 15  CBC   Collection Time: 01/24/24  1:06 PM  Result Value Ref Range   WBC 6.5 4.0 - 10.5 K/uL   RBC 4.96 4.22 - 5.81 MIL/uL   Hemoglobin 13.4 13.0 - 17.0 g/dL   HCT 09.8 11.9 - 14.7 %   MCV 89.3 80.0 - 100.0 fL   MCH 27.0 26.0 - 34.0 pg   MCHC 30.2 30.0 - 36.0 g/dL   RDW 82.9 56.2 - 13.0 %   Platelets 210 150 - 400 K/uL   nRBC 0.0 0.0 - 0.2 %  Troponin I (High Sensitivity)   Collection Time: 01/24/24  1:06 PM  Result Value Ref Range   Troponin I (High Sensitivity) 6 <18 ng/L  Troponin I (High Sensitivity)   Collection Time: 01/24/24  9:38 PM  Result Value Ref Range   Troponin I (High Sensitivity) 6 <18 ng/L    EKG EKG Interpretation Date/Time:  Wednesday January 24 2024 12:34:26 EST Ventricular Rate:  55 PR Interval:  232 QRS Duration:  98 QT Interval:  406 QTC Calculation: 388 R Axis:   29  Text Interpretation: Sinus bradycardia with 1st degree A-V block Possible Anterior infarct , age undetermined No significant change since last tracing When compared with ECG of 15-Sep-2023 15:38, PREVIOUS ECG IS PRESENT Confirmed by Gwyneth Sprout ([id number]) on 01/24/2024 1:06:57 PM  Radiology DG Chest 2 View Result Date: 01/24/2024 CLINICAL DATA:  Chest pain. EXAM: CHEST - 2 VIEW COMPARISON:  09/15/2023. FINDINGS: Bilateral lung fields are clear. Bilateral costophrenic angles are clear. Normal cardio-mediastinal silhouette. No acute osseous abnormalities. The soft tissues  are within normal limits. IMPRESSION: No active cardiopulmonary disease. Electronically Signed   By: Jules Schick M.D.   On: 01/24/2024 15:27    Procedures Procedures    Medications Ordered in ED Medications - No data to display  ED Course/ Medical Decision Making/ A&P  Medical Decision Making Patient BIB daughter for evaluation of right sided, sharp chest pain since this morning. No fever, cough, SOB, vomiting.   History of CAD (stent in 2018), HLD, GERD  Labs:  CBC, CMET all normal Troponin negative x 2  EKG - unchanged from previous Eloise Harman)  CXR - clear  The patient remained asymptomatic in the ED. He has been seen by Dr. Eloise Harman and is felt appropriate for discharge home.   Amount and/or Complexity of Data Reviewed Labs: ordered. Radiology: ordered.           Final Clinical Impression(s) / ED Diagnoses Final diagnoses:  Nonspecific chest pain    Rx / DC Orders ED Discharge Orders     None         Danne Harbor 01/24/24 2258    Rondel Baton, MD 01/29/24 [phone]

## 2024-01-24 NOTE — ED Provider Triage Note (Signed)
Emergency Medicine Provider Triage Evaluation Note  Dale Horn Sr. , a 83 y.o. male  was evaluated in triage.  Pt complains of chest pain that started in the middle the night after he took a shower.  He has had 2 severe episodes now since that time that made him feel short of breath and he describes in the right side of his chest that goes into his right arm.  No syncope, nausea or vomiting.  History of CAD with stents in the past.  Has otherwise been compliant with his medications and reports the pain is almost gone now  Review of Systems  Positive: Chest pain no shortness of breath Negative: Abdominal pain, nausea or vomiting  Physical Exam  BP (!) 137/91 (BP Location: Left Arm)   Pulse (!) 52   Temp 98.1 F (36.7 C) (Oral)   Resp 16   Ht 5' 5.5" (1.664 m)   Wt 68.9 kg   SpO2 99%   BMI 24.91 kg/m  Gen:   Awake, no distress   Resp:  Normal effort  MSK:   Moves extremities without difficulty  Other: Cardiac exam with regular rate and rhythm and no murmurs are noted   Medical Decision Making  Medically screening exam initiated at 1:07 PM.  Appropriate orders placed.  Dale Horn Sr. was informed that the remainder of the evaluation will be completed by another provider, this initial triage assessment does not replace that evaluation, and the importance of remaining in the ED until their evaluation is complete.  Pt will need acs work up.  No specific risk for PE/DVT and no hx concerninng for infection.   Gwyneth Sprout, MD 01/24/24 1308

## 2024-01-24 NOTE — ED Notes (Signed)
Family member upset that they have not been discharge at this time, upset over the wait time, RN Trinna Post explained to pt and family member that primary RN (this Clinical research associate) has been in a CODE involving CPR, family member stated "we have been here too long, it should not take long to get discharge"  This RN apologized for the delay, explained that this RN was involved with a really sick pt (noted that pt and family in hall bed outside the room here a CODE was taking place, aware of the occurrence as to why RN delay to getting them dispo in a timely manner). Pt sticks out arm and stated "get this Arlan Organ me now". Advised will remove IV momentary, to please be patient as this RN just came from a critical ill patient and will dispo as soon as possible. Male visitor appear to have rolled their eyes at this RN. Pt and visitor, both irritated. This RN to print d/c papers at this time, NT Sona to get last set of vitals as asked. This event was also witnssed by [employer]

## 2024-01-24 NOTE — Discharge Instructions (Addendum)
Your labs and xray are all normal. You can be discharged home and should follow up with your doctor for recheck this week.   Return to the ED as needed for any new or concerning symptoms.

## 2024-01-24 NOTE — ED Triage Notes (Signed)
Pt via [facility] from home c/o chest pain since last night. CP is right-sided, sharp, constant with no other symptoms. Previous MI with stent placement. Received 324mg  aspirin and 1 NTG tablet en route with improvement in CP. EKG unremarkable. A/O x 4.   HR 54 BP 166/82 RR 18 O2 96% RA CBG 106

## 2024-01-25 ENCOUNTER — Telehealth: Payer: Self-pay

## 2024-01-25 NOTE — Transitions of Care (Post Inpatient/ED Visit) (Signed)
   01/25/2024  Name: Dale LONGMORE Sr. MRN: [id number] DOB: 06/11/1941  Today's TOC FU Call Status: Today's TOC FU Call Status:: Unsuccessful Call (1st Attempt) Unsuccessful Call (1st Attempt) Date: 01/25/24  Attempted to reach the patient regarding the most recent Inpatient/ED visit.  Follow Up Plan: Additional outreach attempts will be made to reach the patient to complete the Transitions of Care (Post Inpatient/ED visit) call.   Signature Karena Addison, LPN [facility] Nurse Health Advisor Direct Dial [phone]

## 2024-01-29 NOTE — Transitions of Care (Post Inpatient/ED Visit) (Unsigned)
   01/29/2024  Name: Dale DRESSER Sr. MRN: [id number] DOB: February 23, 1941  Today's TOC FU Call Status: Today's TOC FU Call Status:: Unsuccessful Call (2nd Attempt) Unsuccessful Call (1st Attempt) Date: 01/25/24 Unsuccessful Call (2nd Attempt) Date: 01/29/24  Attempted to reach the patient regarding the most recent Inpatient/ED visit.  Follow Up Plan: Additional outreach attempts will be made to reach the patient to complete the Transitions of Care (Post Inpatient/ED visit) call.   Signature Karena Addison, LPN [facility] Nurse Health Advisor Direct Dial [phone]

## 2024-01-30 ENCOUNTER — Telehealth: Payer: Self-pay | Admitting: Family Medicine

## 2024-01-30 NOTE — Telephone Encounter (Signed)
 Placed in MDs box to be filled out. Dale [name] Bruna Potter, CMA

## 2024-01-30 NOTE — Transitions of Care (Post Inpatient/ED Visit) (Signed)
   01/30/2024  Name: Dale DYAL Sr. MRN: [id number] DOB: [birth year]  Today's TOC FU Call Status: Today's TOC FU Call Status:: Unsuccessful Call (3rd Attempt) Unsuccessful Call (1st Attempt) Date: 01/25/24 Unsuccessful Call (2nd Attempt) Date: 01/29/24 Unsuccessful Call (3rd Attempt) Date: 01/30/24  Attempted to reach the patient regarding the most recent Inpatient/ED visit.  Follow Up Plan: No further outreach attempts will be made at this time. We have been unable to contact the patient.  Signature Karena Addison, LPN [facility] Nurse Health Advisor Direct Dial [phone]

## 2024-02-08 NOTE — Telephone Encounter (Signed)
 Spoke with patient's daughter she said this is a new FMLA paperwork, she informed me that she needed the paperwork asap but I told her that may not be possible. Patient has an upcoming visit with PCP on 02/12/24, more information about FMLA can be discussed then. Penni Bombard CMA

## 2024-02-12 ENCOUNTER — Encounter: Payer: Self-pay | Admitting: Family Medicine

## 2024-02-12 ENCOUNTER — Ambulatory Visit (INDEPENDENT_AMBULATORY_CARE_PROVIDER_SITE_OTHER): Payer: Medicare HMO | Admitting: Family Medicine

## 2024-02-12 VITALS — BP 122/69 | HR 60 | Ht 65.0 in | Wt 152.5 lb

## 2024-02-12 DIAGNOSIS — F02B2 Dementia in other diseases classified elsewhere, moderate, with psychotic disturbance: Secondary | ICD-10-CM | POA: Diagnosis not present

## 2024-02-12 DIAGNOSIS — G308 Other Alzheimer's disease: Secondary | ICD-10-CM | POA: Diagnosis not present

## 2024-02-12 DIAGNOSIS — R413 Other amnesia: Secondary | ICD-10-CM

## 2024-02-12 NOTE — Progress Notes (Signed)
    SUBJECTIVE:   CHIEF COMPLAINT / HPI:   Daughter reports father has history of dementia and has been seen by neurology in the past. Was put on Namenda and donepezil which she felt was helping. Over the last few months, patient has had worsening of symptoms including feeling like people in the TV are talking to him, seeing people that aren't there, and having decreased sleep. Patient denies any violent thoughts, daughter denies violent behavior. Daughter is concerned for his paranoia. Denies any physical changes in walking, tremor, or mood.   PERTINENT  PMH / PSH:  Dementia    OBJECTIVE:   BP 122/69   Pulse 60   Ht 5\' 5"  (1.651 m)   Wt 152 lb 8 oz (69.2 kg)   SpO2 98%   BMI 25.38 kg/m   General: A&O, NAD HEENT: No sign of trauma, EOM grossly intact Cardiac: RRR, no m/r/g Respiratory: CTAB, normal WOB, no w/c/r GI: Soft, NTTP, non-distended  Extremities: NTTP, no peripheral edema. Neuro: No obvious shuffling gait, alert and oriented, no tremor noted, no masked faces noted  Psych: Appropriate mood and affect   ASSESSMENT/PLAN:   Dementia [facility]) Daughter reports worsening. Now has delusions that people in the TV are talking to him and seeing people that aren't there. No violent behavior. Unable to tolerate donepezil due to bradycardia  - Amb referral to neurology - Continue namenda 10 mg  - Referral to geriatric clinic     Hal Morales, MD [facility]

## 2024-02-12 NOTE — Patient Instructions (Signed)
 It was great to see you today! Thank you for choosing [facility] for your primary care. Dale Horn Sr. was seen for follow up.  Today we addressed: Dementia- I sent that referral for neurology and we will have you follow up in Geriatric clinic here. Please stop taking the PLAVIX (clopidogrel)   If you haven't already, sign up for My Chart to have easy access to your labs results, and communication with your primary care physician.  We are checking some labs today. If they are abnormal, I will call you. If they are normal, I will send you a MyChart message (if it is active) or a letter in the mail. If you do not hear about your labs in the next 2 weeks, please call the office.   You should return to our clinic Return in about 3 months (around 05/14/2024).  I recommend that you always bring your medications to each appointment as this makes it easy to ensure you are on the correct medications and helps [place] not miss refills when you need them.  Please arrive 15 minutes before your appointment to ensure smooth check in process.  We appreciate your efforts in making this happen.  Please call the clinic at [phone] if your symptoms worsen or you have any concerns.  Thank you for allowing me to participate in your care, Dale Morales, MD 02/12/2024, 11:36 AM PGY-1, [facility]

## 2024-02-12 NOTE — Assessment & Plan Note (Signed)
 Daughter reports worsening. Now has delusions that people in the TV are talking to him and seeing people that aren't there. No violent behavior. Unable to tolerate donepezil due to bradycardia  - Amb referral to neurology - Continue namenda 10 mg  - Referral to geriatric clinic

## 2024-02-19 ENCOUNTER — Other Ambulatory Visit: Payer: Self-pay

## 2024-02-19 ENCOUNTER — Other Ambulatory Visit: Payer: Self-pay | Admitting: *Deleted

## 2024-02-19 DIAGNOSIS — R350 Frequency of micturition: Secondary | ICD-10-CM

## 2024-02-19 DIAGNOSIS — I214 Non-ST elevation (NSTEMI) myocardial infarction: Secondary | ICD-10-CM

## 2024-02-19 DIAGNOSIS — N400 Enlarged prostate without lower urinary tract symptoms: Secondary | ICD-10-CM

## 2024-02-19 MED ORDER — TAMSULOSIN HCL 0.4 MG PO CAPS: ORAL_CAPSULE | ORAL | 3 refills | Status: DC

## 2024-02-21 MED ORDER — CLOPIDOGREL BISULFATE 75 MG PO TABS: 75.0000 mg | ORAL_TABLET | Freq: Every day | ORAL | 0 refills | Status: AC

## 2024-02-22 ENCOUNTER — Other Ambulatory Visit: Payer: Self-pay | Admitting: *Deleted

## 2024-02-22 MED ORDER — MEMANTINE HCL 10 MG PO TABS: ORAL_TABLET | ORAL | 2 refills | Status: DC

## 2024-02-26 ENCOUNTER — Encounter: Payer: Self-pay | Admitting: Physician Assistant

## 2024-03-27 NOTE — Progress Notes (Signed)
 Assessment/Plan:     Dale HART Sr. is a very pleasant 83 y.o. 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 Recently patient placed again on memantine 10 mg bid (unable to tolerate donepezil) .  MoCA today is .  Workup is in progress.    Memory Impairment  MRI brain without contrast to assess for underlying structural abnormality and assess vascular load  Neurocognitive testing to further evaluate cognitive concerns and determine other underlying cause of memory changes, including potential contribution from sleep, anxiety, attention, or depression among others  Check B12, TSH Recommend good control of cardiovascular risk factors.   Continue to control mood as per PCP Fol[place]ow up in   Subjective:    The patient is accompanied by his daughter***  who supplements the history.    How long did patient have memory difficulties?  For about 10 years, prgressive.  Patient reports some difficulty remembering new information, recent conversations, names. Enjoys=ys ding gardening  repeats oneself?  Endorsed Disoriented when walking into a room?  Patient denies ***  Leaving objects in unusual places?  denies   Wandering behavior? denies   Any personality changes, or depression, anxiety? denies*** Hallucinations or paranoia?   + paranoia, feeling like "people are talking about him", seeing people that are not there, people on TV talking to him.  Seizures? denies    Any sleep changes?  Sleeps well ***/does not sleep well. ***vivid dreams, REM behavior or sleepwalking   Sleep apnea? Denies.   Any hygiene concerns?  Denies.    Independent of bathing and dressing? Endorsed  Does the patient need help with medications?  is in charge *** Who is in charge of the finances?  is in charge   *** Any changes in appetite?   Denies. ***   Patient have trouble swallowing?  Denies.   Does the patient cook? No*** Any headaches?  Denies.   Chronic pain? Denies.   Ambulates with difficulty? Denies *** HE likes to ride his bike Recent falls or head injuries? Denies.     Vision changes?  Denies any new issues.  Has a history of*** Any strokelike symptoms? Denies.   Any tremors? Denies. *** Any anosmia? Denies.   Any incontinence of urine? Denies.   Any bowel dysfunction? Denies.      Patient lives with his daughter ***  History of heavy alcohol intake? Denies.   History of heavy tobacco use? Denies.   Family history of dementia?  Father had Alzheimer's dementia  Does patient drive? No *** Retired from working for [employer]  6th grade   No Known Allergies  Current Outpatient Medications  Medication Instructions   aspirin 81 mg, Oral, Daily   Blood Pressure Monitoring (COMFORT TOUCH BP CUFF/MEDIUM) MISC Use daily   clopidogrel (PLAVIX) 75 mg, Oral, Daily   docusate sodium (COLACE) 100 MG capsule Take 1-2 every few days as needed   isosorbide mononitrate (IMDUR) 60 MG 24 hr tablet TAKE 1 TABLET(60 MG) BY MOUTH DAILY   loratadine (CLARITIN) 10 mg, Daily PRN   memantine (NAMENDA) 10 MG tablet Take 1 tab twice daily.   nitroGLYCERIN (  NITROSTAT) 0.4 mg, Sublingual, Every 5 min x3 PRN   pantoprazole (PROTONIX) 40 MG tablet Take one daily as needed   pravastatin (PRAVACHOL) 80 mg, Oral, Daily at bedtime   tamsulosin (FLOMAX) 0.4 MG CAPS capsule TAKE 1 CAPSULE(0.4 MG) BY MOUTH DAILY AFTER SUPPER   Zoster Vaccine Adjuvanted (SHINGRIX) injection Inject 0.5 ml IM and Repeat in 2 months     VITALS:  There were no vitals filed for this visit.    PHYSICAL EXAM   HEENT:  Normocephalic, atraumatic.  The superficial  temporal arteries are without ropiness or tenderness. Cardiovascular: Regular rate and rhythm. Lungs: Clear to auscultation bilaterally. Neck: There are no carotid bruits noted bilaterally.  NEUROLOGICAL:     No data to display             09/26/2018    9:28 AM 03/27/2018    2:13 PM 09/06/2017   10:11 AM  MMSE - Mini Mental State Exam  Not completed: --    Orientation to time 2 4 3   Orientation to Place 3 3 4   Registration 3 3 3   Attention/ Calculation 2 0 1  Recall 2 1 1   Language- name 2 objects 2 2 2   Language- repeat 1 1 0  Language- follow 3 step command 3 3 3   Language- read & follow direction 0 1 1  Write a sentence 0 1 1  Copy design 0 0 0  Total score 18 19 19      Orientation:  Alert and oriented to person, place and not to time***. No aphasia or dysarthria. Fund of knowledge is appropriate. Recent and remote memory impaired.  Attention and concentration are reduced***.  Able to name objects and repeat phrases /5 ***. Delayed recall  / *** Cranial nerves: There is good facial symmetry. Extraocular muscles are intact and visual fields are full to confrontational testing. Speech is fluent and clear. No tongue deviation. Hearing is intact to conversational tone.*** Tone: Tone is good throughout. Sensation: Sensation is intact to light touch.  Vibration is intact at the bilateral big toe.  Coordination: The patient has no difficulty with RAM's or FNF bilaterally. Normal finger to nose  Motor: Strength is 5/5 in the bilateral upper and lower extremities. There is no pronator drift. There are no fasciculations noted. DTR's: Deep tendon reflexes are 2/4 bilaterally. Gait and Station: The patient is able to ambulate without difficulty. Gait is cautious and narrow. Stride length is normal ***      Thank you for allowing us  the opportunity to participate in the care of this nice patient. Please do not hesitate to contact us  for any questions or concerns.   Total time spent  on today's visit was *** minutes dedicated to this patient today, preparing to see patient, examining the patient, ordering tests and/or medications and counseling the patient, documenting clinical information in the EHR or other health record, independently interpreting results and communicating results to the patient/family, discussing treatment and goals, answering patient's questions and coordinating care.  Cc:  Farris Hong, MD  Dale [name] 03/27/2024 12:53 PM

## 2024-03-28 ENCOUNTER — Encounter

## 2024-03-28 ENCOUNTER — Other Ambulatory Visit

## 2024-03-28 ENCOUNTER — Ambulatory Visit: Payer: Self-pay | Admitting: Physician Assistant

## 2024-03-28 ENCOUNTER — Encounter: Payer: Self-pay | Admitting: Physician Assistant

## 2024-03-28 VITALS — BP 120/62 | HR 62 | Resp 20 | Ht 65.0 in | Wt 153.0 lb

## 2024-03-28 DIAGNOSIS — R413 Other amnesia: Secondary | ICD-10-CM | POA: Diagnosis not present

## 2024-03-28 DIAGNOSIS — F03918 Unspecified dementia, unspecified severity, with other behavioral disturbance: Secondary | ICD-10-CM | POA: Diagnosis not present

## 2024-03-28 NOTE — Patient Instructions (Addendum)
 It was a pleasure to see you today at our office.   Recommendations:    MRI of the brain, the radiology office will call you to arrange you appointment  [phone] Check labs today  [address] Continue memantine 10 mg twice a day  Follow up in May 23 at 9:30  Recommend visiting the website : " Dementia Success Path" to better understand some behaviors related to memory loss.   For psychiatric meds, mood meds: Please have your primary care physician manage these medications.  If you have any severe symptoms of a stroke, or other severe issues such as confusion,severe chills or fever, etc call 911 or go to the ER as you may need to be evaluated further   For guidance regarding [facility] and if placement were needed at the facility, contact Social Worker tel: [phone]  For assessment of decision of mental capacity and competency:  Call Dr. Laverne Potter, geriatric psychiatrist at [phone]  Counseling regarding caregiver distress, including caregiver depression, anxiety and issues regarding community resources, adult day care programs, adult living facilities, or memory care questions:  please contact your  Primary Doctor's Social Worker   Whom to call: Memory  decline, memory medications: Call our office [phone]    [email or web address]   RECOMMENDATIONS FOR ALL PATIENTS WITH MEMORY PROBLEMS: 1. Continue to exercise (Recommend 30 minutes of walking everyday, or 3 hours every week) 2. Increase social interactions - continue going to [place] and enjoy social gatherings with friends and family 3. Eat healthy, avoid fried foods and eat more fruits and vegetables 4. Maintain adequate blood pressure, blood sugar, and blood cholesterol level. Reducing the risk of stroke and cardiovascular disease also helps promoting better memory. 5. Avoid stressful situations. Live a simple life and avoid aggravations.  Organize your time and prepare for the next day in anticipation. 6. Sleep well, avoid any interruptions of sleep and avoid any distractions in the bedroom that may interfere with adequate sleep quality 7. Avoid sugar, avoid sweets as there is a strong link between excessive sugar intake, diabetes, and cognitive impairment We discussed the Mediterranean diet, which has been shown to help patients reduce the risk of progressive memory disorders and reduces cardiovascular risk. This includes eating fish, eat fruits and green leafy vegetables, nuts like almonds and hazelnuts, walnuts, and also use olive oil. Avoid fast foods and fried foods as much as possible. Avoid sweets and sugar as sugar use has been linked to worsening of memory function.  There is always a concern of gradual progression of memory problems. If this is the case, then we may need to adjust level of care according to patient needs. Support, both to the patient and caregiver, should then be put into place.      You have been referred for a neuropsychological evaluation (i.e., evaluation of memory and thinking abilities). Please bring someone with you to this appointment if possible, as it is helpful for the doctor to hear from both you and another adult who knows you well. Please bring eyeglasses and hearing aids if you wear them.    The evaluation will take approximately 3 hours and has two parts:   The first part is a clinical interview with the neuropsychologist (Dr. Kitty Perkins or Dr. Donavon Fudge). During the interview, the neuropsychologist will speak with you and the individual you brought to the appointment.    The second part of the evaluation is testing with the doctor's technician Bernabe Brew or Burdette Carolin). During the testing,  the technician will ask you to remember different types of material, solve problems, and answer some questionnaires. Your family member will not be present for this portion of the evaluation.   Please note: We must reserve  several hours of the neuropsychologist's time and the psychometrician's time for your evaluation appointment. As such, there is a No-Show fee of $100. If you are unable to attend any of your appointments, please contact our office as soon as possible to reschedule.      DRIVING: Regarding driving, in patients with progressive memory problems, driving will be impaired. We advise to have someone else do the driving if trouble finding directions or if minor accidents are reported. Independent driving assessment is available to determine safety of driving.   If you are interested in the driving assessment, you can contact the following:  [facility] in [place] [phone]  [facility] [phone]  [facility] [phone]  [facility] [phone] or [phone]   FALL PRECAUTIONS: Be cautious when walking. Scan the area for obstacles that may increase the risk of trips and falls. When getting up in the mornings, sit up at the edge of the bed for a few minutes before getting out of bed. Consider elevating the bed at the head end to avoid drop of blood pressure when getting up. Walk always in a well-lit room (use night lights in the walls). Avoid area rugs or power cords from appliances in the middle of the walkways. Use a walker or a cane if necessary and consider physical therapy for balance exercise. Get your eyesight checked regularly.  FINANCIAL OVERSIGHT: Supervision, especially oversight when making financial decisions or transactions is also recommended.  HOME SAFETY: Consider the safety of the kitchen when operating appliances like stoves, microwave oven, and blender. Consider having supervision and share cooking responsibilities until no longer able to participate in those. Accidents with firearms and other hazards in the house should be identified and addressed as well.   ABILITY TO BE LEFT ALONE: If patient is unable to contact 911  operator, consider using LifeLine, or when the need is there, arrange for someone to stay with patients. Smoking is a fire hazard, consider supervision or cessation. Risk of wandering should be assessed by caregiver and if detected at any point, supervision and safe proof recommendations should be instituted.  MEDICATION SUPERVISION: Inability to self-administer medication needs to be constantly addressed. Implement a mechanism to ensure safe administration of the medications.      Mediterranean Diet A Mediterranean diet refers to food and lifestyle choices that are based on the traditions of countries located on the [place] [place]. This way of eating has been shown to help prevent certain conditions and improve outcomes for people who have chronic diseases, like kidney disease and heart disease. What are tips for following this plan? Lifestyle  Cook and eat meals together with your family, when possible. Drink enough fluid to keep your urine clear or pale yellow. Be physically active every day. This includes: Aerobic exercise like running or swimming. Leisure activities like gardening, walking, or housework. Get 7-8 hours of sleep each night. If recommended by your health care provider, drink red wine in moderation. This means 1 glass a day for nonpregnant women and 2 glasses a day for men. A glass of wine equals 5 oz (150 mL). Reading food labels  Check the serving size of packaged foods. For foods such as rice and pasta, the serving size refers to the amount of  cooked product, not dry. Check the total fat in packaged foods. Avoid foods that have saturated fat or trans fats. Check the ingredients list for added sugars, such as corn syrup. Shopping  At the grocery store, buy most of your food from the areas near the walls of the store. This includes: Fresh fruits and vegetables (produce). Grains, beans, nuts, and seeds. Some of these may be available in unpackaged forms or large amounts  (in bulk). Fresh seafood. Poultry and eggs. Low-fat dairy products. Buy whole ingredients instead of prepackaged foods. Buy fresh fruits and vegetables in-season from local farmers markets. Buy frozen fruits and vegetables in resealable bags. If you do not have access to quality fresh seafood, buy precooked frozen shrimp or canned fish, such as tuna, salmon, or sardines. Buy small amounts of raw or cooked vegetables, salads, or olives from the deli or salad bar at your store. Stock your pantry so you always have certain foods on hand, such as olive oil, canned tuna, canned tomatoes, rice, pasta, and beans. Cooking  Cook foods with extra-virgin olive oil instead of using butter or other vegetable oils. Have meat as a side dish, and have vegetables or grains as your main dish. This means having meat in small portions or adding small amounts of meat to foods like pasta or stew. Use beans or vegetables instead of meat in common dishes like chili or lasagna. Experiment with different cooking methods. Try roasting or broiling vegetables instead of steaming or sauteing them. Add frozen vegetables to soups, stews, pasta, or rice. Add nuts or seeds for added healthy fat at each meal. You can add these to yogurt, salads, or vegetable dishes. Marinate fish or vegetables using olive oil, lemon juice, garlic, and fresh herbs. Meal planning  Plan to eat 1 vegetarian meal one day each week. Try to work up to 2 vegetarian meals, if possible. Eat seafood 2 or more times a week. Have healthy snacks readily available, such as: Vegetable sticks with hummus. Greek yogurt. Fruit and nut trail mix. Eat balanced meals throughout the week. This includes: Fruit: 2-3 servings a day Vegetables: 4-5 servings a day Low-fat dairy: 2 servings a day Fish, poultry, or lean meat: 1 serving a day Beans and legumes: 2 or more servings a week Nuts and seeds: 1-2 servings a day Whole grains: 6-8 servings a  day Extra-virgin olive oil: 3-4 servings a day Limit red meat and sweets to only a few servings a month What are my food choices? Mediterranean diet Recommended Grains: Whole-grain pasta. Brown rice. Bulgar wheat. Polenta. Couscous. Whole-wheat bread. Dwyane Glad. Vegetables: Artichokes. Beets. Broccoli. Cabbage. Carrots. Eggplant. Green beans. Chard. Kale. Spinach. Onions. Leeks. Peas. Squash. Tomatoes. Peppers. Radishes. Fruits: Apples. Apricots. Avocado. Berries. Bananas. Cherries. Dates. Figs. Grapes. Lemons. Melon. Oranges. Peaches. Plums. Pomegranate. Meats and other protein foods: Beans. Almonds. Sunflower seeds. Pine nuts. Peanuts. Cod. Salmon. Scallops. Shrimp. Tuna. Tilapia. Clams. Oysters. Eggs. Dairy: Low-fat milk. Cheese. Greek yogurt. Beverages: Water. Red wine. Herbal tea. Fats and oils: Extra virgin olive oil. Avocado oil. Grape seed oil. Sweets and desserts: [place] yogurt with honey. Baked apples. Poached pears. Trail mix. Seasoning and other foods: Basil. Cilantro. Coriander. Cumin. Mint. Parsley. Sage. Rosemary. Tarragon. Garlic. Oregano. Thyme. Pepper. Balsalmic vinegar. Tahini. Hummus. Tomato sauce. Olives. Mushrooms. Limit these Grains: Prepackaged pasta or rice dishes. Prepackaged cereal with added sugar. Vegetables: Deep fried potatoes (french fries). Fruits: Fruit canned in syrup. Meats and other protein foods: Beef. Pork. Lamb. Poultry with skin. Hot dogs.  Helene Loader. Dairy: Ice cream. Sour cream. Whole milk. Beverages: Juice. Sugar-sweetened soft drinks. Beer. Liquor and spirits. Fats and oils: Butter. Canola oil. Vegetable oil. Beef fat (tallow). Lard. Sweets and desserts: Cookies. Cakes. Pies. Candy. Seasoning and other foods: Mayonnaise. Premade sauces and marinades. The items listed may not be a complete list. Talk with your dietitian about what dietary choices are right for you. Summary The Mediterranean diet includes both food and lifestyle choices. Eat a  variety of fresh fruits and vegetables, beans, nuts, seeds, and whole grains. Limit the amount of red meat and sweets that you eat. Talk with your health care provider about whether it is safe for you to drink red wine in moderation. This means 1 glass a day for nonpregnant women and 2 glasses a day for men. A glass of wine equals 5 oz (150 mL). This information is not intended to replace advice given to you by your health care provider. Make sure you discuss any questions you have with your health care provider. Document Released: 07/21/2016 Document Revised: 08/23/2016 Document Reviewed: 07/21/2016 Elsevier Interactive Patient Education  2017 [employer].

## 2024-03-29 LAB — TSH: TSH: 1.35 m[IU]/L (ref 0.40–4.50)

## 2024-03-29 LAB — VITAMIN B12: Vitamin B-12: 569 pg/mL (ref 200–1100)

## 2024-03-29 NOTE — Progress Notes (Signed)
Thyroid and B12 are normal, thanks

## 2024-04-16 ENCOUNTER — Encounter: Payer: Self-pay | Admitting: Neurology

## 2024-04-17 ENCOUNTER — Ambulatory Visit
Admission: RE | Admit: 2024-04-17 | Discharge: 2024-04-17 | Disposition: A | Discharge status: Home / Self Care | Source: Ambulatory Visit | Attending: Physician Assistant | Admitting: Physician Assistant

## 2024-04-17 DIAGNOSIS — R413 Other amnesia: Secondary | ICD-10-CM | POA: Diagnosis not present

## 2024-04-17 DIAGNOSIS — I6782 Cerebral ischemia: Secondary | ICD-10-CM | POA: Diagnosis not present

## 2024-04-23 ENCOUNTER — Ambulatory Visit: Payer: Self-pay | Admitting: Physician Assistant

## 2024-05-03 ENCOUNTER — Ambulatory Visit: Admitting: Physician Assistant

## 2024-05-03 ENCOUNTER — Encounter: Payer: Self-pay | Admitting: Physician Assistant

## 2024-05-03 VITALS — BP 133/68 | HR 52 | Resp 20 | Ht 62.0 in | Wt 154.0 lb

## 2024-05-03 DIAGNOSIS — F03918 Unspecified dementia, unspecified severity, with other behavioral disturbance: Secondary | ICD-10-CM | POA: Diagnosis not present

## 2024-05-03 NOTE — Patient Instructions (Signed)
 It was a pleasure to see you today at our office.   Recommendations:  Continue memantine  10 mg twice a day  Follow up in 6 months Recommend visiting the website : " Dementia Success Path" to better understand some behaviors related to memory loss.   For psychiatric meds, mood meds: Please have your primary care physician manage these medications.  If you have any severe symptoms of a stroke, or other severe issues such as confusion,severe chills or fever, etc call 911 or go to the ER as you may need to be evaluated further   For guidance regarding [facility] and if placement were needed at the facility, contact Social Worker tel: [phone]  For assessment of decision of mental capacity and competency:  Call Dr. Laverne Potter, geriatric psychiatrist at [phone]  Counseling regarding caregiver distress, including caregiver depression, anxiety and issues regarding community resources, adult day care programs, adult living facilities, or memory care questions:  please contact your  Primary Doctor's Social Worker   Whom to call: Memory  decline, memory medications: Call our office [phone]    [email or web address]   RECOMMENDATIONS FOR ALL PATIENTS WITH MEMORY PROBLEMS: 1. Continue to exercise (Recommend 30 minutes of walking everyday, or 3 hours every week) 2. Increase social interactions - continue going to [place] and enjoy social gatherings with friends and family 3. Eat healthy, avoid fried foods and eat more fruits and vegetables 4. Maintain adequate blood pressure, blood sugar, and blood cholesterol level. Reducing the risk of stroke and cardiovascular disease also helps promoting better memory. 5. Avoid stressful situations. Live a simple life and avoid aggravations. Organize your time and prepare for the next day in anticipation. 6. Sleep well, avoid any interruptions of sleep and avoid any  distractions in the bedroom that may interfere with adequate sleep quality 7. Avoid sugar, avoid sweets as there is a strong link between excessive sugar intake, diabetes, and cognitive impairment We discussed the Mediterranean diet, which has been shown to help patients reduce the risk of progressive memory disorders and reduces cardiovascular risk. This includes eating fish, eat fruits and green leafy vegetables, nuts like almonds and hazelnuts, walnuts, and also use olive oil. Avoid fast foods and fried foods as much as possible. Avoid sweets and sugar as sugar use has been linked to worsening of memory function.  There is always a concern of gradual progression of memory problems. If this is the case, then we may need to adjust level of care according to patient needs. Support, both to the patient and caregiver, should then be put into place.          FALL PRECAUTIONS: Be cautious when walking. Scan the area for obstacles that may increase the risk of trips and falls. When getting up in the mornings, sit up at the edge of the bed for a few minutes before getting out of bed. Consider elevating the bed at the head end to avoid drop of blood pressure when getting up. Walk always in a well-lit room (use night lights in the walls). Avoid area rugs or power cords from appliances in the middle of the walkways. Use a walker or a cane if necessary and consider physical therapy for balance exercise. Get your eyesight checked regularly.  FINANCIAL OVERSIGHT: Supervision, especially oversight when making financial decisions or transactions is also recommended.  HOME SAFETY: Consider the safety of the kitchen when operating appliances like stoves, microwave oven, and blender. Consider having supervision and share cooking responsibilities  until no longer able to participate in those. Accidents with firearms and other hazards in the house should be identified and addressed as well.   ABILITY TO BE LEFT ALONE:  If patient is unable to contact 911 operator, consider using LifeLine, or when the need is there, arrange for someone to stay with patients. Smoking is a fire hazard, consider supervision or cessation. Risk of wandering should be assessed by caregiver and if detected at any point, supervision and safe proof recommendations should be instituted.  MEDICATION SUPERVISION: Inability to self-administer medication needs to be constantly addressed. Implement a mechanism to ensure safe administration of the medications.      Mediterranean Diet A Mediterranean diet refers to food and lifestyle choices that are based on the traditions of countries located on the [employer] [place]. This way of eating has been shown to help prevent certain conditions and improve outcomes for people who have chronic diseases, like kidney disease and heart disease. What are tips for following this plan? Lifestyle  Cook and eat meals together with your family, when possible. Drink enough fluid to keep your urine clear or pale yellow. Be physically active every day. This includes: Aerobic exercise like running or swimming. Leisure activities like gardening, walking, or housework. Get 7-8 hours of sleep each night. If recommended by your health care provider, drink red wine in moderation. This means 1 glass a day for nonpregnant women and 2 glasses a day for men. A glass of wine equals 5 oz (150 mL). Reading food labels  Check the serving size of packaged foods. For foods such as rice and pasta, the serving size refers to the amount of cooked product, not dry. Check the total fat in packaged foods. Avoid foods that have saturated fat or trans fats. Check the ingredients list for added sugars, such as corn syrup. Shopping  At the grocery store, buy most of your food from the areas near the walls of the store. This includes: Fresh fruits and vegetables (produce). Grains, beans, nuts, and seeds. Some of these may be available  in unpackaged forms or large amounts (in bulk). Fresh seafood. Poultry and eggs. Low-fat dairy products. Buy whole ingredients instead of prepackaged foods. Buy fresh fruits and vegetables in-season from local farmers markets. Buy frozen fruits and vegetables in resealable bags. If you do not have access to quality fresh seafood, buy precooked frozen shrimp or canned fish, such as tuna, salmon, or sardines. Buy small amounts of raw or cooked vegetables, salads, or olives from the deli or salad bar at your store. Stock your pantry so you always have certain foods on hand, such as olive oil, canned tuna, canned tomatoes, rice, pasta, and beans. Cooking  Cook foods with extra-virgin olive oil instead of using butter or other vegetable oils. Have meat as a side dish, and have vegetables or grains as your main dish. This means having meat in small portions or adding small amounts of meat to foods like pasta or stew. Use beans or vegetables instead of meat in common dishes like chili or lasagna. Experiment with different cooking methods. Try roasting or broiling vegetables instead of steaming or sauteing them. Add frozen vegetables to soups, stews, pasta, or rice. Add nuts or seeds for added healthy fat at each meal. You can add these to yogurt, salads, or vegetable dishes. Marinate fish or vegetables using olive oil, lemon juice, garlic, and fresh herbs. Meal planning  Plan to eat 1 vegetarian meal one day each week. Try to  work up to 2 vegetarian meals, if possible. Eat seafood 2 or more times a week. Have healthy snacks readily available, such as: Vegetable sticks with hummus. Greek yogurt. Fruit and nut trail mix. Eat balanced meals throughout the week. This includes: Fruit: 2-3 servings a day Vegetables: 4-5 servings a day Low-fat dairy: 2 servings a day Fish, poultry, or lean meat: 1 serving a day Beans and legumes: 2 or more servings a week Nuts and seeds: 1-2 servings a day Whole  grains: 6-8 servings a day Extra-virgin olive oil: 3-4 servings a day Limit red meat and sweets to only a few servings a month What are my food choices? Mediterranean diet Recommended Grains: Whole-grain pasta. Brown rice. Bulgar wheat. Polenta. Couscous. Whole-wheat bread. Dwyane Glad. Vegetables: Artichokes. Beets. Broccoli. Cabbage. Carrots. Eggplant. Green beans. Chard. Kale. Spinach. Onions. Leeks. Peas. Squash. Tomatoes. Peppers. Radishes. Fruits: Apples. Apricots. Avocado. Berries. Bananas. Cherries. Dates. Figs. Grapes. Lemons. Melon. Oranges. Peaches. Plums. Pomegranate. Meats and other protein foods: Beans. Almonds. Sunflower seeds. Pine nuts. Peanuts. Cod. Salmon. Scallops. Shrimp. Tuna. Tilapia. Clams. Oysters. Eggs. Dairy: Low-fat milk. Cheese. Greek yogurt. Beverages: Water. Red wine. Herbal tea. Fats and oils: Extra virgin olive oil. Avocado oil. Grape seed oil. Sweets and desserts: [place] yogurt with honey. Baked apples. Poached pears. Trail mix. Seasoning and other foods: Basil. Cilantro. Coriander. Cumin. Mint. Parsley. Sage. Rosemary. Tarragon. Garlic. Oregano. Thyme. Pepper. Balsalmic vinegar. Tahini. Hummus. Tomato sauce. Olives. Mushrooms. Limit these Grains: Prepackaged pasta or rice dishes. Prepackaged cereal with added sugar. Vegetables: Deep fried potatoes (french fries). Fruits: Fruit canned in syrup. Meats and other protein foods: Beef. Pork. Lamb. Poultry with skin. Hot dogs. Helene Loader. Dairy: Ice cream. Sour cream. Whole milk. Beverages: Juice. Sugar-sweetened soft drinks. Beer. Liquor and spirits. Fats and oils: Butter. Canola oil. Vegetable oil. Beef fat (tallow). Lard. Sweets and desserts: Cookies. Cakes. Pies. Candy. Seasoning and other foods: Mayonnaise. Premade sauces and marinades. The items listed may not be a complete list. Talk with your dietitian about what dietary choices are right for you. Summary The Mediterranean diet includes both food and  lifestyle choices. Eat a variety of fresh fruits and vegetables, beans, nuts, seeds, and whole grains. Limit the amount of red meat and sweets that you eat. Talk with your health care provider about whether it is safe for you to drink red wine in moderation. This means 1 glass a day for nonpregnant women and 2 glasses a day for men. A glass of wine equals 5 oz (150 mL). This information is not intended to replace advice given to you by your health care provider. Make sure you discuss any questions you have with your health care provider. Document Released: 07/21/2016 Document Revised: 08/23/2016 Document Reviewed: 07/21/2016 Elsevier Interactive Patient Education  2017 [employer].

## 2024-05-03 NOTE — Progress Notes (Signed)
 Assessment/Plan:   Mild Cognitive Impairment likely due to Alzheimer's disease  Dale MORRICAL Sr. is a very pleasant 83 y.o. 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 Recently patient placed again on memantine  10 mg bid by PCP (unable to tolerate donepezil  due to bradycardia) seen today to discuss the results of the MRI of the brain read on 04/22/2024, personally reviewed, remarkable for mild diffuse cerebral volume loss, mild chronic microvascular ischemia, cerebral and hippocampal atrophy.  No acute findings were noted.  Last MMSE on April 2025 was 12/30.  Had a long conversation with his daughter, who is here with him today. Unfortunately, there are no other agents that can be added in an effort to slow down the progression of the disease in view of his history of bradycardia.  However he appears to remain stable, able to perform his basic ADLs.  No longer drives.  Mood is good.   Dementia likely due to Alzheimer's disease   Continue memantine  10 mg twice daily, side effects discussed (bradycardia) Follow-up in 6 months Recommend good control of cardiovascular risk factors Continue to control mood as per PCP  Initial visit 03/28/2024 How long did patient have memory difficulties?  For about 10 years, progressive.  Patient reports some difficulty remembering new information, recent conversations, names. LTM is involved as well.  Enjoys  gardening, does not do any brain stimulating exercises repeats oneself?  Endorsed Disoriented when walking into a room?   Endorsed, he tells his daughter "Who lives in this house?".   Leaving objects in unusual places? Endorsed, for example keys in a coat pocket, may find his phone in a toilet paper holder  Wandering behavior? Endorsed, at times. "He is outdoorsy Orthoptist says.  Any  depression, anxiety? "No depression but anxiety"-daughter says.  Hallucinations or paranoia?   + paranoia, feeling like "people are talking about him", seeing people on TV talking to him.  Seizures? Denies.    Any sleep changes?  "Sleeps better after being more active during the day doing yard work" . Denies vivid dreams, REM behavior or sleepwalking   Sleep apnea? Denies.   Any hygiene concerns? Endorsed," needs to be reminded quite often"   Independent of bathing and dressing? Endorsed  Does the patient need help with medications? Daughter is in charge   Who is in charge of the finances? Son is in charge     Any changes in appetite?   Denies. "Great appetite". Drinks plenty water   Patient have trouble swallowing?  Denies.   Does the patient cook? No  Any headaches?  Denies.   Chronic pain? Denies. History of cervical spondylosis but denies neck pain   Ambulates with difficulty? Denies.   He likes to ride his bike Recent falls or head injuries? Denies.     Vision changes?  Denies any new issues.  Has a history of cataracts that may be bothering him, has ophthalmology seeing him soon   Any strokelike symptoms? Denies.   Any tremors? Denies.   Any anosmia? Denies.   Any incontinence of urine? urge incontinence, does not wear pads or diapers Any bowel dysfunction? Denies.  Patient lives with his daughter   History of heavy alcohol intake? Denies.   History of heavy tobacco use? Denies.  Quit 15 years ago. Family history of dementia?  Father, all his siblings and his cousins  had Alzheimer's dementia  Does patient drive? "No, after his truck broke"-daughter says   Retired from working for [employer]  6th grade       CURRENT MEDICATIONS:  Outpatient Encounter  Medications as of 05/03/2024  Medication Sig   aspirin  81 MG chewable tablet Chew 1 tablet (81 mg total) by mouth daily.   Blood Pressure Monitoring (COMFORT TOUCH BP CUFF/MEDIUM) MISC Use daily   clopidogrel  (PLAVIX ) 75 MG tablet Take 1 tablet (75 mg total) by mouth daily.   docusate sodium  (COLACE) 100 MG capsule Take 1-2 every few days as needed   isosorbide  mononitrate (IMDUR ) 60 MG 24 hr tablet TAKE 1 TABLET(60 MG) BY MOUTH DAILY   loratadine  (CLARITIN ) 10 MG tablet Take 10 mg by mouth daily as needed for allergies.   memantine  (NAMENDA ) 10 MG tablet Take 1 tab twice daily.   nitroGLYCERIN  (NITROSTAT ) 0.4 MG SL tablet Place 1 tablet (0.4 mg total) under the tongue every 5 (five) minutes x 3 doses as needed for chest pain.   pantoprazole  (PROTONIX ) 40 MG tablet Take one daily as needed   pravastatin  (PRAVACHOL ) 80 MG tablet Take 1 tablet (80 mg total) by mouth at bedtime.   tamsulosin  (FLOMAX ) 0.4 MG CAPS capsule TAKE 1 CAPSULE(0.4 MG) BY MOUTH DAILY AFTER SUPPER   Zoster Vaccine Adjuvanted (SHINGRIX) injection Inject 0.5 ml IM and Repeat in 2 months   No facility-administered encounter medications on file as of 05/03/2024.       03/28/2024   12:00 PM 09/26/2018    9:28 AM 03/27/2018    2:13 PM  MMSE - Mini Mental State Exam  Not completed:  --   Orientation to time 0 2 4  Orientation to Place 3 3 3   Registration 3 3 3   Attention/ Calculation 0 2 0  Recall 0 2 1  Language- name 2 objects 2 2 2   Language- repeat 1 1 1   Language- follow 3 step command 2 3 3   Language- read & follow direction 1 0 1  Write a sentence 0 0 1  Copy design 0 0 0  Total score 12 18 19        No data to display         Thank you for allowing us  the opportunity to participate in the care of this nice patient. Please do not hesitate to contact us  for any questions or concerns.   Total time spent on today's visit was 21 minutes dedicated to this patient today, preparing to see patient, examining the  patient, ordering tests and/or medications and counseling the patient, documenting clinical information in the EHR or other health record, independently interpreting results and communicating results to the patient/family, discussing treatment and goals, answering patient's questions and coordinating care.  Cc:  Farris Hong, MD  Tex Filbert 05/03/2024 9:52 AM

## 2024-05-12 ENCOUNTER — Other Ambulatory Visit: Payer: Self-pay | Admitting: Nurse Practitioner

## 2024-05-16 ENCOUNTER — Ambulatory Visit

## 2024-05-23 DIAGNOSIS — H04123 Dry eye syndrome of bilateral lacrimal glands: Secondary | ICD-10-CM | POA: Diagnosis not present

## 2024-07-26 ENCOUNTER — Encounter (HOSPITAL_COMMUNITY): Payer: Self-pay

## 2024-07-26 ENCOUNTER — Ambulatory Visit (HOSPITAL_COMMUNITY)
Admission: EM | Admit: 2024-07-26 | Discharge: 2024-07-26 | Disposition: A | Discharge status: Home / Self Care | Attending: Emergency Medicine | Admitting: Emergency Medicine

## 2024-07-26 ENCOUNTER — Encounter (HOSPITAL_BASED_OUTPATIENT_CLINIC_OR_DEPARTMENT_OTHER): Payer: Self-pay

## 2024-07-26 ENCOUNTER — Other Ambulatory Visit: Payer: Self-pay

## 2024-07-26 DIAGNOSIS — R531 Weakness: Secondary | ICD-10-CM

## 2024-07-26 DIAGNOSIS — R2689 Other abnormalities of gait and mobility: Secondary | ICD-10-CM

## 2024-07-26 DIAGNOSIS — R42 Dizziness and giddiness: Secondary | ICD-10-CM

## 2024-07-26 DIAGNOSIS — Z7982 Long term (current) use of aspirin: Secondary | ICD-10-CM | POA: Insufficient documentation

## 2024-07-26 LAB — BASIC METABOLIC PANEL WITH GFR
Anion gap: 13 (ref 5–15)
BUN: 15 mg/dL (ref 8–23)
CO2: 23 mmol/L (ref 22–32)
Calcium: 9.5 mg/dL (ref 8.9–10.3)
Chloride: 107 mmol/L (ref 98–111)
Creatinine, Ser: 1.14 mg/dL (ref 0.61–1.24)
GFR, Estimated: >60 mL/min (ref >60)
Glucose, Bld: 88 mg/dL (ref 70–99)
Potassium: 4.1 mmol/L (ref 3.5–5.1)
Sodium: 143 mmol/L (ref 135–145)

## 2024-07-26 LAB — URINALYSIS, ROUTINE W REFLEX MICROSCOPIC
Bilirubin Urine: NEGATIVE
Glucose, UA: NEGATIVE mg/dL
Hgb urine dipstick: NEGATIVE
Ketones, ur: NEGATIVE mg/dL
Leukocytes,Ua: NEGATIVE
Nitrite: NEGATIVE
Protein, ur: NEGATIVE mg/dL
Specific Gravity, Urine: 1.021 (ref 1.005–1.030)
pH: 6 (ref 5.0–8.0)

## 2024-07-26 LAB — CBC
HCT: 43.4 % (ref 39.0–52.0)
Hemoglobin: 13.2 g/dL (ref 13.0–17.0)
MCH: 27.5 pg (ref 26.0–34.0)
MCHC: 30.4 g/dL (ref 30.0–36.0)
MCV: 90.4 fL (ref 80.0–100.0)
Platelets: 209 K/uL (ref 150–400)
RBC: 4.8 MIL/uL (ref 4.22–5.81)
RDW: 12 % (ref 11.5–15.5)
WBC: 6.6 K/uL (ref 4.0–10.5)
nRBC: 0 % (ref 0.0–0.2)

## 2024-07-26 LAB — POCT FASTING CBG KUC MANUAL ENTRY: POCT Glucose (KUC): 128 mg/dL — AB (ref 70–99)

## 2024-07-26 NOTE — ED Notes (Signed)
 Patient is being discharged from the Urgent Care and sent to the Emergency Department via personal opperated vehicle . Per Rumaldo Ryder NP, patient is in need of higher level of care due to dizziness. Patient is aware and verbalizes understanding of plan of care.  Vitals:   07/26/24 1758  BP: 128/71  Pulse: (!) 57  Resp: 18  Temp: 98.2 F (36.8 C)  SpO2: 95%

## 2024-07-26 NOTE — ED Triage Notes (Signed)
 Pt daughter reports pt having increased dizziness and balance issues. Stroke screen negative in triage.

## 2024-07-26 NOTE — ED Triage Notes (Signed)
 Patient presenting with off balance, dizzy, and stumbling onset last night. States feels like head is spinning. Denies any sick symptoms or recent falls. NO new foods or meds.  Prescriptions or OTC medications tried: No

## 2024-07-26 NOTE — ED Provider Notes (Signed)
 [facility]    CSN: [id number] Arrival date & time: 07/26/24  1726      History   Chief Complaint Chief Complaint  Patient presents with   Dizziness    HPI Dale SASSANO Sr. is a 83 y.o. male.   Patient presents with daughter for dizziness, feeling off balance, and stumbling that began last night.  Patient states that he feels like his head is spinning.  Patient states that this does worsen with position changes, but he is still constantly dizzy while sitting or standing.  Daughter reports that he has also been fatigued and weak.  Daughter states that he normally is very active and would be out working in the yard donates daily today, but states that he was inside resting instead.  Patient does have a history of dementia.  Daughter states that patient's neighbor had reported that he was mildly confused more than normal earlier today as well.  Daughter denies any known fever, pain, or urinary symptoms.  Patient denies chest pain, shortness of breath, palpitations, and headache.  Patient denies any falls or head injuries that he remembers.  Patient is currently taking Plavix .  The history is provided by the patient, a relative and medical records. The history is limited by the condition of the patient (History of dementia).  Dizziness   Past Medical History:  Diagnosis Date   Coronary artery disease    Dementia (HCC)    GERD (gastroesophageal reflux disease)    Hyperlipemia    Leg mass    rt   Memory loss    Myocardial infarction less than 4 weeks ago (HCC) 12/29/2016   Prostate disease    Quit smoking 11/20/2008   Qualifier: Diagnosis of  By: Jeanelle MD, Layman KUGEL angioplasty with stent to LCX     Patient Active Problem List   Diagnosis Date Noted   Lip swelling 12/11/2023   Hypotension 09/17/2019   Bradycardia    CAD S/P percutaneous coronary angioplasty 09/10/2018   Dementia (HCC) 03/27/2018   NSTEMI (non-ST elevated myocardial infarction)  (HCC) 12/30/2016   Dyslipidemia 02/04/2015   BPH (benign prostatic hyperplasia) 10/15/2014   GERD (gastroesophageal reflux disease) 08/07/2014    Past Surgical History:  Procedure Laterality Date   CARDIAC CATHETERIZATION N/A 12/30/2016   Procedure: Coronary Stent Intervention;  Surgeon: Dorn JINNY Lesches, MD;  Location: [facility];  Service: Cardiovascular;  Laterality: N/A;   CARDIAC CATHETERIZATION N/A 12/30/2016   Procedure: Coronary Angiography;  Surgeon: Dorn JINNY Lesches, MD;  Location[facility] CV [facility];  Service: Cardiovascular;  Laterality: N/A;   CARDIAC CATHETERIZATION N/A 12/30/2016   Procedure: Coronary Balloon Angioplasty;  Surgeon: Dorn JINNY Lesches, MD;  Location: [facility];  Service: Cardiovascular;  Laterality: N/A;   CORONARY STENT PLACEMENT         Home Medications    Prior to Admission medications   Medication Sig Start Date End Date Taking? Authorizing Provider  aspirin  81 MG chewable tablet Chew 1 tablet (81 mg total) by mouth daily. 12/08/20  Yes Henriette Mora, MD  Blood Pressure Monitoring (COMFORT TOUCH BP CUFF/MEDIUM) MISC Use daily 01/24/23  Yes Chambliss, Layman CROME, MD  clopidogrel  (PLAVIX ) 75 MG tablet Take 1 tablet (75 mg total) by mouth daily. 02/21/24  Yes Baloch, Mahnoor, MD  docusate sodium  (COLACE) 100 MG capsule Take 1-2 every few days as needed 01/24/23  Yes Chambliss, Layman CROME, MD  isosorbide  mononitrate (IMDUR ) 60 MG 24 hr  tablet TAKE 1 TABLET(60 MG) BY MOUTH DAILY 05/14/24  Yes Court Dorn PARAS, MD  loratadine  (CLARITIN ) 10 MG tablet Take 10 mg by mouth daily as needed for allergies.   Yes [provider]  memantine  (NAMENDA ) 10 MG tablet Take 1 tab twice daily. 02/22/24  Yes Baloch, Mahnoor, MD  pantoprazole  (PROTONIX ) 40 MG tablet Take one daily as needed 08/29/22  Yes Chambliss, Layman CROME, MD  pravastatin  (PRAVACHOL ) 80 MG tablet Take 1 tablet (80 mg total) by mouth at bedtime. 12/04/23  Yes Chambliss, Layman CROME, MD   tamsulosin  (FLOMAX ) 0.4 MG CAPS capsule TAKE 1 CAPSULE(0.4 MG) BY MOUTH DAILY AFTER SUPPER 02/19/24  Yes Baloch, Mahnoor, MD  nitroGLYCERIN  (NITROSTAT ) 0.4 MG SL tablet Place 1 tablet (0.4 mg total) under the tongue every 5 (five) minutes x 3 doses as needed for chest pain. 05/09/23   Daneen Damien BROCKS, NP  Zoster Vaccine Adjuvanted [facility]) injection Inject 0.5 ml IM and Repeat in 2 months 12/11/23   Jeanelle Layman CROME, MD    Family History Family History  Problem Relation Age of Onset   Cancer Mother        Unsure of type   Heart attack Father    Alzheimer's disease Father     Social History Social History   Tobacco Use   Smoking status: Former    Current packs/day: 0.00    Average packs/day: 1 pack/day for 20.0 years (20.0 ttl pk-yrs)    Types: Cigarettes    Start date: 10/23/1991    Quit date: 10/23/2011    Years since quitting: 12.7   Smokeless tobacco: Never  Vaping Use   Vaping status: Never Used  Substance Use Topics   Alcohol use: No    Comment: occ   Drug use: Not Currently     Allergies   Patient has no known allergies.   Review of Systems Review of Systems  Neurological:  Positive for dizziness.   Per HPI  Physical Exam Triage Vital Signs ED Triage Vitals  Encounter Vitals Group     BP 07/26/24 1758 128/71     Girls Systolic BP Percentile --      Girls Diastolic BP Percentile --      Boys Systolic BP Percentile --      Boys Diastolic BP Percentile --      Pulse Rate 07/26/24 1758 (!) 57     Resp 07/26/24 1758 18     Temp 07/26/24 1758 98.2 F (36.8 C)     Temp Source 07/26/24 1758 Oral     SpO2 07/26/24 1758 95 %     Weight --      Height 07/26/24 1758 5' 2 (1.575 m)     Head Circumference --      Peak Flow --      Pain Score 07/26/24 1757 0     Pain Loc --      Pain Education --      Exclude from Growth Chart --    No data found.  Updated Vital Signs BP 128/71   Pulse (!) 57   Temp 98.2 F (36.8 C) (Oral)   Resp 18   Ht 5'  2 (1.575 m)   SpO2 95%   BMI 28.17 kg/m   Visual Acuity Right Eye Distance:   Left Eye Distance:   Bilateral Distance:    Right Eye Near:   Left Eye Near:    Bilateral Near:     Physical Exam Vitals and nursing  note reviewed.  Constitutional:      General: He is awake. He is not in acute distress.    Appearance: Normal appearance. He is well-developed and well-groomed. He is not ill-appearing.  HENT:     Head: Normocephalic and atraumatic.  Eyes:     Extraocular Movements: Extraocular movements intact.     Pupils: Pupils are equal, round, and reactive to light.  Cardiovascular:     Rate and Rhythm: Regular rhythm. Bradycardia present.  Pulmonary:     Effort: Pulmonary effort is normal.     Breath sounds: Normal breath sounds.  Musculoskeletal:        General: Normal range of motion.  Neurological:     Mental Status: He is alert. Mental status is at baseline. He is disoriented.     GCS: GCS eye subscore is 4. GCS verbal subscore is 4. GCS motor subscore is 6.     Cranial Nerves: Cranial nerves 2-12 are intact.     Sensory: Sensation is intact.     Motor: Motor function is intact.     Coordination: Coordination is intact.     Gait: Gait is intact.  Psychiatric:        Behavior: Behavior is cooperative.      UC Treatments / Results  Labs (all labs ordered are listed, but only abnormal results are displayed) Labs Reviewed  POCT FASTING CBG [facility] MANUAL ENTRY - Abnormal; Notable for the following components:      Result Value   POCT Glucose ([facility]) 128 (*)    All other components within normal limits    EKG   Radiology No results found.  Procedures Procedures (including critical care time)  Medications Ordered in UC Medications - No data to display  Initial Impression / Assessment and Plan / UC Course  I have reviewed the triage vital signs and the nursing notes.  Pertinent labs & imaging results that were available during my care of the patient were  reviewed by me and considered in my medical decision making (see chart for details).     Patient is overall well-appearing.  Vitals are stable.  No acute neurodeficits noted on exam.  Confused verbal response per baseline.  No other significant findings upon exam.  EKG revealed sinus bradycardia with first-degree AV block which is consistent with previous that was performed in March 2025.  Nonfasting CBG within normal limits.  Recommend the patient be seen in the emergency department for further evaluation due to new onset of symptoms.  Family and patient agreeable to this plan at this time.  Patient is stable to arrived to the ER via POV with daughter. Final Clinical Impressions(s) / UC Diagnoses   Final diagnoses:  Dizziness  Balance problem  Weakness     Discharge Instructions      Please take him to the emergency department for further evaluation of his new onset of dizziness, weakness, and balance issues.   ED Prescriptions   None    PDMP not reviewed this encounter.   Johnie Flaming A, NP 07/26/24 [phone]

## 2024-07-26 NOTE — Discharge Instructions (Signed)
 Please take him to the emergency department for further evaluation of his new onset of dizziness, weakness, and balance issues.

## 2024-07-27 ENCOUNTER — Emergency Department (HOSPITAL_BASED_OUTPATIENT_CLINIC_OR_DEPARTMENT_OTHER)
Admission: EM | Admit: 2024-07-27 | Discharge: 2024-07-27 | Disposition: A | Discharge status: Home / Self Care | Attending: Emergency Medicine | Admitting: Emergency Medicine

## 2024-07-27 ENCOUNTER — Emergency Department (HOSPITAL_BASED_OUTPATIENT_CLINIC_OR_DEPARTMENT_OTHER)

## 2024-07-27 DIAGNOSIS — I6502 Occlusion and stenosis of left vertebral artery: Secondary | ICD-10-CM | POA: Diagnosis not present

## 2024-07-27 DIAGNOSIS — R42 Dizziness and giddiness: Secondary | ICD-10-CM

## 2024-07-27 DIAGNOSIS — I672 Cerebral atherosclerosis: Secondary | ICD-10-CM | POA: Diagnosis not present

## 2024-07-27 DIAGNOSIS — I6523 Occlusion and stenosis of bilateral carotid arteries: Secondary | ICD-10-CM | POA: Diagnosis not present

## 2024-07-27 MED ORDER — SODIUM CHLORIDE 0.9 % IV BOLUS
500.0000 mL | Freq: Once | INTRAVENOUS | Status: AC
  Administered 2024-07-27: 500 mL via INTRAVENOUS

## 2024-07-27 MED ORDER — MECLIZINE HCL 25 MG PO TABS
12.5000 mg | ORAL_TABLET | Freq: Once | ORAL | Status: AC
  Administered 2024-07-27: 12.5 mg via ORAL
  Filled 2024-07-27: qty 1

## 2024-07-27 MED ORDER — MECLIZINE HCL 12.5 MG PO TABS: 12.5000 mg | ORAL_TABLET | Freq: Three times a day (TID) | ORAL | 0 refills | Status: DC | PRN

## 2024-07-27 MED ORDER — IOHEXOL 350 MG/ML SOLN
75.0000 mL | Freq: Once | INTRAVENOUS | Status: AC | PRN
  Administered 2024-07-27: 75 mL via INTRAVENOUS

## 2024-07-27 NOTE — ED Notes (Signed)
 Pt ambulated appx 100 feet with a steady gait and without dizziness

## 2024-07-27 NOTE — ED Provider Notes (Signed)
 [place] EMERGENCY DEPARTMENT AT [facility] Provider Note   CSN: [id number] Arrival date & time: 07/26/24  1900     Patient presents with: Dizziness   Dale MUSCATELLO Sr. is a 83 y.o. male.   Patient presents to the emergency department for evaluation of dizziness.  Symptoms present when he woke this morning.  He has been experiencing dizziness throughout the day.  He does have a sensation of spinning at times.  Daughter reports that he seems to worsen with changes in position.  A couple of times when he tried to stand from a seated position he fell back into the chair.  While in the waiting room he bent over to pick something up and his symptoms significantly worsened.  He reports that he feels much better lying in the exam bed and not moving.  He denies headache.       Prior to Admission medications   Medication Sig Start Date End Date Taking? Authorizing Provider  meclizine  (ANTIVERT ) 12.5 MG tablet Take 1 tablet (12.5 mg total) by mouth 3 (three) times daily as needed for dizziness. 07/27/24  Yes Jahkeem Kurka, Lonni PARAS, MD  aspirin  81 MG chewable tablet Chew 1 tablet (81 mg total) by mouth daily. 12/08/20   Henriette Mora, MD  Blood Pressure Monitoring (COMFORT TOUCH BP CUFF/MEDIUM) MISC Use daily 01/24/23   Jeanelle Layman CROME, MD  clopidogrel  (PLAVIX ) 75 MG tablet Take 1 tablet (75 mg total) by mouth daily. 02/21/24   Baloch, Mahnoor, MD  docusate sodium  (COLACE) 100 MG capsule Take 1-2 every few days as needed 01/24/23   Jeanelle Layman CROME, MD  isosorbide  mononitrate (IMDUR ) 60 MG 24 hr tablet TAKE 1 TABLET(60 MG) BY MOUTH DAILY 05/14/24   Court Dorn PARAS, MD  loratadine  (CLARITIN ) 10 MG tablet Take 10 mg by mouth daily as needed for allergies.    [provider]  memantine  (NAMENDA ) 10 MG tablet Take 1 tab twice daily. 02/22/24   Baloch, Mahnoor, MD  nitroGLYCERIN  (NITROSTAT ) 0.4 MG SL tablet Place 1 tablet (0.4 mg total) under the tongue every 5 (five)  minutes x 3 doses as needed for chest pain. 05/09/23   Daneen Damien BROCKS, NP  pantoprazole  (PROTONIX ) 40 MG tablet Take one daily as needed 08/29/22   Jeanelle Layman CROME, MD  pravastatin  (PRAVACHOL ) 80 MG tablet Take 1 tablet (80 mg total) by mouth at bedtime. 12/04/23   Jeanelle Layman CROME, MD  tamsulosin  (FLOMAX ) 0.4 MG CAPS capsule TAKE 1 CAPSULE(0.4 MG) BY MOUTH DAILY AFTER SUPPER 02/19/24   Baloch, Mahnoor, MD  Zoster Vaccine Adjuvanted [facility]) injection Inject 0.5 ml IM and Repeat in 2 months 12/11/23   Jeanelle Layman CROME, MD    Allergies: Patient has no known allergies.    Review of Systems  Updated Vital Signs BP (!) 163/67   Pulse (!) 49   Temp 97.6 F (36.4 C) (Oral)   Resp 16   Ht 5' 5 (1.651 m)   Wt 63.5 kg   SpO2 99%   BMI 23.30 kg/m   Physical Exam Vitals and nursing note reviewed.  Constitutional:      General: He is not in acute distress.    Appearance: He is well-developed.  HENT:     Head: Normocephalic and atraumatic.     Mouth/Throat:     Mouth: Mucous membranes are moist.  Eyes:     General: Vision grossly intact. Gaze aligned appropriately.     Extraocular Movements: Extraocular movements intact.  Conjunctiva/sclera: Conjunctivae normal.  Cardiovascular:     Rate and Rhythm: Normal rate and regular rhythm.     Pulses: Normal pulses.     Heart sounds: Normal heart sounds, S1 normal and S2 normal. No murmur heard.    No friction rub. No gallop.  Pulmonary:     Effort: Pulmonary effort is normal. No respiratory distress.     Breath sounds: Normal breath sounds.  Abdominal:     Palpations: Abdomen is soft.     Tenderness: There is no abdominal tenderness. There is no guarding or rebound.     Hernia: No hernia is present.  Musculoskeletal:        General: No swelling.     Cervical back: Full passive range of motion without pain, normal range of motion and neck supple. No pain with movement, spinous process tenderness or muscular tenderness.  Normal range of motion.     Right lower leg: No edema.     Left lower leg: No edema.  Skin:    General: Skin is warm and dry.     Capillary Refill: Capillary refill takes less than 2 seconds.     Findings: No ecchymosis, erythema, lesion or wound.  Neurological:     General: No focal deficit present.     Mental Status: He is alert and oriented to person, place, and time.     GCS: GCS eye subscore is 4. GCS verbal subscore is 5. GCS motor subscore is 6.     Cranial Nerves: Cranial nerves 2-12 are intact.     Sensory: Sensation is intact.     Motor: Motor function is intact. No weakness or abnormal muscle tone.     Coordination: Coordination is intact.     Comments: Extraocular muscle movement: normal No visual field cut Pupils: equal and reactive both direct and consensual response is normal No nystagmus present    Sensory function is intact to light touch, pinprick Proprioception intact  Grip strength 5/5 symmetric in upper extremities No pronator drift Normal finger to nose bilaterally  Lower extremity strength 5/5 against gravity Normal heel to shin bilaterally    Psychiatric:        Mood and Affect: Mood normal.        Speech: Speech normal.        Behavior: Behavior normal.     (all labs ordered are listed, but only abnormal results are displayed) Labs Reviewed  URINALYSIS, ROUTINE W REFLEX MICROSCOPIC  BASIC METABOLIC PANEL WITH GFR  CBC    EKG: EKG Interpretation Date/Time:  Friday July 26 2024 19:10:08 EDT Ventricular Rate:  54 PR Interval:  222 QRS Duration:  82 QT Interval:  420 QTC Calculation: 398 R Axis:   14  Text Interpretation: Sinus bradycardia with 1st degree A-V block Cannot rule out Anterior infarct (cited on or before 24-Jan-2024) Abnormal ECG When compared with ECG of 26-Jul-2024 18:22, No significant change was found Confirmed by Zackowski, Scott [phone]) on 07/26/2024 7:41:11 PM  Radiology: CT ANGIO HEAD NECK W WO CM Result Date:  07/27/2024 EXAM: CT HEAD WITHOUT CTA HEAD AND NECK WITH AND WITHOUT 07/27/2024 04:55:19 AM TECHNIQUE: CTA of the head and neck was performed with and without the administration of intravenous contrast. Noncontrast CT of the head with reconstructed 2-D images are also provided for review. Multiplanar 2D and/or 3D reformatted images are provided for review. Automated exposure control, iterative reconstruction, and/or weight based adjustment of the mA/kV was utilized to reduce the radiation dose to  as low as reasonably achievable. COMPARISON: MRI head without contrast 04/17/24 CT head without contrast 08/03/16 CLINICAL HISTORY: Vertigo, central. Centralized vertigo FINDINGS: CT HEAD: BRAIN AND VENTRICLES: No acute intracranial hemorrhage. No mass effect or midline shift. No extra-axial fluid collection. Gray-white differentiation is maintained. No hydrocephalus. Moderate atrophy and white matter changes are similar to the prior exam. ORBITS: No acute abnormality. SINUSES: No acute abnormality. SOFT TISSUES AND SKULL: No acute abnormality. CTA NECK: AORTIC ARCH AND ARCH VESSELS: No dissection or arterial injury. No significant stenosis of the brachiocephalic or subclavian arteries. CERVICAL CAROTID ARTERIES: Minimal atherosclerotic changes are present at the right carotid bifurcation. Mild tortuosity is present in the cervical right ICA. No focal stenosis is present. Mild atherosclerotic changes are present at the left carotid bifurcation without significant stenosis. CERVICAL VERTEBRAL ARTERIES: The right vertebral artery is a dominant vessel. VISUALIZED LUNGS AND MEDIASTINUM: Unremarkable. SOFT TISSUES: No acute abnormality. BONES: Multilevel degenerative changes are present in the cervical spine. Slight degenerative retrolisthesis present at C4-5. Straightening of the normal cervical lordosis is present. CTA HEAD: ANTERIOR CIRCULATION: Atherosclerotic calcifications present in the cavernous internal carotid arteries,  left greater than right. No focal stenosis is present through the ICA termina. The anterior communicating artery is patent. No aneurysm. POSTERIOR CIRCULATION: Atherosclerotic calcifications are present in the dural margin of both vertebral arteries. Moderate left vertebral artery stenosis is present. No significant stenosis of the posterior cerebral arteries. No significant stenosis of the basilar artery. No aneurysm. OTHER: No dural venous sinus thrombosis on this non-dedicated study. IMPRESSION: 1. No acute intracranial abnormality. 2. Moderate atrophy and white matter changes, similar to prior exam. 3. 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 The right vertebral artery is the dominant vessel. 4. Atherosclerotic changes at the carotid bifurcations bilaterally the dural margin of the right vertebral artery and within the cavernous internal carotid arteries bilaterally without significant stenosis at these locations. 5. No other significant stenosis or aneurysm in the head or neck. Electronically signed by: Lonni Necessary MD 07/27/2024 05:16 AM EDT RP Workstation: [id number]     Procedures   Medications Ordered in the ED  iohexol  (OMNIPAQUE ) 350 MG/ML injection 75 mL (75 mLs Intravenous Contrast Given 07/27/24 0432)  sodium chloride  0.9 % bolus 500 mL (0 mLs Intravenous Stopped 07/27/24 0623)  meclizine  (ANTIVERT ) tablet 12.5 mg (12.5 mg Oral Given 07/27/24 0559)                                    Medical Decision Making Amount and/or Complexity of Data Reviewed External Data Reviewed: ECG and notes. Labs: ordered. Decision-making details documented in ED Course. Radiology: ordered and independent interpretation performed. Decision-making details documented in ED Course. ECG/medicine tests: ordered and independent interpretation performed. Decision-making details documented in ED Course.  Risk Prescription drug management.   Differential Diagnosis considered includes,  but not limited to: Stroke; TIA; vertebrobasilar insufficiency; peripheral vertigo; orthostatic hypotension   Presents to the emergency department for evaluation of dizziness.  Patient has been experiencing dizziness through the day today.  Dizzy spells appear to occur with movement, improves when he sits still.  Here in the ED he has a normal neurologic exam.  He has no symptoms while lying on the exam table.  Patient underwent testing.  Lab work was unremarkable. Orthostatics were negative but he did have some dizziness with standing.  Patient underwent CTA angio head and neck which did  not show any acute abnormality.  Patient given low-dose meclizine  and IV fluids.  After this treatment he was able to ambulate without any dizziness.     Final diagnoses:  Vertigo    ED Discharge Orders          Ordered    meclizine  (ANTIVERT ) 12.5 MG tablet  3 times daily PRN        07/27/24 9367               Haze Lonni PARAS, MD 07/27/24 [phone]

## 2024-07-27 NOTE — ED Notes (Signed)
 Orthostatics:  Lying:  NOT dizzy BP 119/82 HR 57  Sitting:  was dizzy BP 121/68 HR 51  Standing: More Dizzy BP 137/66 HR 54

## 2024-07-30 NOTE — Progress Notes (Unsigned)
    SUBJECTIVE:   CHIEF COMPLAINT / HPI:   Vertigo - Went to the ED 8/16 for dizziness especially with positional changes - Orthostatics were negative, but he did have some dizziness with standing. CTA head and neck were negative for acute pathology. - Symptoms improved with low-dose meclizine  and IV fluids.   PERTINENT  PMH / PSH: ***  OBJECTIVE:   There were no vitals taken for this visit.  General: Awake and Alert in NAD HEENT: NCAT. Sclera anicteric. No rhinorrhea. Cardiovascular: RRR. No M/R/G Respiratory: CTAB, normal WOB on RA. No wheezing, crackles, rhonchi, or diminished breath sounds. Abdomen: Soft, non-tender, non-distended. Bowel sounds normoactive/hypoactive/hyperactive. *** Extremities: Able to move all extremities. No BLE edema, no deformities or significant joint findings. Skin: Warm and dry. No abrasions or rashes noted. Neuro: A&Ox***. No focal neurological deficits.  ASSESSMENT/PLAN:   Assessment & Plan      Kathrine Melena, DO [facility]

## 2024-07-31 ENCOUNTER — Encounter: Payer: Self-pay | Admitting: Family Medicine

## 2024-07-31 ENCOUNTER — Ambulatory Visit (INDEPENDENT_AMBULATORY_CARE_PROVIDER_SITE_OTHER): Admitting: Family Medicine

## 2024-07-31 VITALS — BP 128/67 | HR 58 | Ht 65.0 in | Wt 153.2 lb

## 2024-07-31 DIAGNOSIS — R42 Dizziness and giddiness: Secondary | ICD-10-CM

## 2024-07-31 NOTE — Patient Instructions (Signed)
 It was great to see you today! Thank you for choosing [facility] for your primary care. Dale GORMAN Pollock Sr. was seen for dizziness.  Today we addressed: Dizziness - this could be a side effect of the Namenda  or Memantine  you are on, I would discontinue that medication along with the Meclizine  to see if your symptoms improve. I will also refer you to vestibular rehab to help with your symptoms.  You should return to our clinic Return if symptoms worsen or fail to improve. Please arrive 15 minutes before your appointment to ensure smooth check in process.  We appreciate your efforts in making this happen.  Thank you for allowing me to participate in your care, Kathrine Melena, DO 07/31/2024, 12:00 PM PGY-2, [facility]

## 2024-08-21 ENCOUNTER — Other Ambulatory Visit: Payer: Self-pay | Admitting: Family Medicine

## 2024-08-21 ENCOUNTER — Emergency Department (HOSPITAL_COMMUNITY): Admission: EM | Admit: 2024-08-21 | Discharge: 2024-08-21 | Disposition: A | Discharge status: Home / Self Care

## 2024-08-21 ENCOUNTER — Other Ambulatory Visit: Payer: Self-pay

## 2024-08-21 ENCOUNTER — Emergency Department (HOSPITAL_COMMUNITY)

## 2024-08-21 ENCOUNTER — Other Ambulatory Visit: Payer: Self-pay | Admitting: Cardiovascular Disease

## 2024-08-21 ENCOUNTER — Encounter (HOSPITAL_COMMUNITY): Payer: Self-pay

## 2024-08-21 DIAGNOSIS — R079 Chest pain, unspecified: Secondary | ICD-10-CM | POA: Diagnosis not present

## 2024-08-21 DIAGNOSIS — R109 Unspecified abdominal pain: Secondary | ICD-10-CM | POA: Diagnosis not present

## 2024-08-21 DIAGNOSIS — Z7982 Long term (current) use of aspirin: Secondary | ICD-10-CM | POA: Diagnosis not present

## 2024-08-21 DIAGNOSIS — I440 Atrioventricular block, first degree: Secondary | ICD-10-CM | POA: Diagnosis not present

## 2024-08-21 DIAGNOSIS — R0789 Other chest pain: Secondary | ICD-10-CM | POA: Diagnosis not present

## 2024-08-21 DIAGNOSIS — N3289 Other specified disorders of bladder: Secondary | ICD-10-CM | POA: Diagnosis not present

## 2024-08-21 DIAGNOSIS — N400 Enlarged prostate without lower urinary tract symptoms: Secondary | ICD-10-CM | POA: Diagnosis not present

## 2024-08-21 LAB — TROPONIN I (HIGH SENSITIVITY)
Troponin I (High Sensitivity): 8 ng/L (ref <18)
Troponin I (High Sensitivity): 7 ng/L (ref <18)

## 2024-08-21 LAB — COMPREHENSIVE METABOLIC PANEL WITH GFR
ALT: 10 U/L (ref 0–44)
AST: 20 U/L (ref 15–41)
Albumin: 3.4 g/dL — ABNORMAL LOW (ref 3.5–5.0)
Alkaline Phosphatase: 84 U/L (ref 38–126)
Anion gap: 7 (ref 5–15)
BUN: 9 mg/dL (ref 8–23)
CO2: 28 mmol/L (ref 22–32)
Calcium: 9.1 mg/dL (ref 8.9–10.3)
Chloride: 104 mmol/L (ref 98–111)
Creatinine, Ser: 1.15 mg/dL (ref 0.61–1.24)
GFR, Estimated: >60 mL/min (ref >60)
Glucose, Bld: 100 mg/dL — ABNORMAL HIGH (ref 70–99)
Potassium: 3.9 mmol/L (ref 3.5–5.1)
Sodium: 139 mmol/L (ref 135–145)
Total Bilirubin: 0.4 mg/dL (ref 0.0–1.2)
Total Protein: 6.4 g/dL — ABNORMAL LOW (ref 6.5–8.1)

## 2024-08-21 LAB — CBC
HCT: 42.7 % (ref 39.0–52.0)
Hemoglobin: 12.7 g/dL — ABNORMAL LOW (ref 13.0–17.0)
MCH: 27.6 pg (ref 26.0–34.0)
MCHC: 29.7 g/dL — ABNORMAL LOW (ref 30.0–36.0)
MCV: 92.8 fL (ref 80.0–100.0)
Platelets: 193 K/uL (ref 150–400)
RBC: 4.6 MIL/uL (ref 4.22–5.81)
RDW: 12.4 % (ref 11.5–15.5)
WBC: 7.4 K/uL (ref 4.0–10.5)
nRBC: 0 % (ref 0.0–0.2)

## 2024-08-21 LAB — LIPASE, BLOOD: Lipase: 37 U/L (ref 11–51)

## 2024-08-21 MED ORDER — IOHEXOL 350 MG/ML SOLN
75.0000 mL | Freq: Once | INTRAVENOUS | Status: AC | PRN
  Administered 2024-08-21: 75 mL via INTRAVENOUS

## 2024-08-21 NOTE — Discharge Instructions (Signed)
 Please call and follow-up with your primary care doctor as well as your cardiologist.  You had some narrowing in one of the vessels in your groin, and this can be followed up by vascular surgery.  The number is listed for vascular surgery below.  Referral was placed in the ER.  If you do not hear from them by the end of the week call to make an appointment.

## 2024-08-21 NOTE — ED Provider Triage Note (Signed)
 Emergency Medicine Provider Triage Evaluation Note  Dale GORMAN Pollock Sr. , a 83 y.o. male  was evaluated in triage.  Pt complains of epigastric pain which began this morning.  History of MI but this feels different.  No fever or emesis..  Review of Systems  Positive: Epigastric pain Negative: No shortness of breath  Physical Exam  BP (!) 148/88 (BP Location: Right Arm)   Pulse (!) 57   Temp 98.3 F (36.8 C) (Oral)   Resp 16   Ht 1.651 m (5' 5)   SpO2 99%   BMI 25.50 kg/m   Medical Decision Making  Medically screening exam initiated at 12:27 PM.  Appropriate orders placed.  Dale GORMAN Pollock Sr. was informed that the remainder of the evaluation will be completed by another provider, this initial triage assessment does not replace that evaluation, and the importance of remaining in the ED until their evaluation is complete.     Dasie Faden, MD 08/21/24 1227

## 2024-08-21 NOTE — ED Triage Notes (Signed)
 Pt here for CP/epigastric pain. Hx of STEMI. Pt states tightness/pressure. Denies SHOB/n/v. EMS gave 324mg  of aspirin  and 0.4 nitroglycerin . VSS. axox4.

## 2024-08-21 NOTE — ED Notes (Addendum)
 Assuming care of this pt at this time. He is AAOx4. Pt ambulates to bed from RR. Family at bedside. VS WNL

## 2024-08-21 NOTE — ED Provider Notes (Signed)
 [place] EMERGENCY DEPARTMENT AT [facility] Provider Note   CSN: [id number] Arrival date & time: 08/21/24  1116     Patient presents with: Chest Pain   Morton RAMAN Duman Sr. is a 83 y.o. male.   83 year old male presents for evaluation of chest pain.  He states at this time it has resolved.  He states earlier it was in the center of his chest, nonradiating.  He states that he had some abdominal discomfort as well.  He does have a history of coronary artery disease.  States he takes aspirin  daily.  Denies any shortness of breath, nausea or any other symptoms or concerns at this time.   Chest Pain Associated symptoms: no abdominal pain, no back pain, no cough, no fever, no palpitations, no shortness of breath and no vomiting        Prior to Admission medications   Medication Sig Start Date End Date Taking? Authorizing Provider  aspirin  81 MG chewable tablet Chew 1 tablet (81 mg total) by mouth daily. 12/08/20   Henriette Mora, MD  Blood Pressure Monitoring (COMFORT TOUCH BP CUFF/MEDIUM) MISC Use daily 01/24/23   Jeanelle Layman CROME, MD  clopidogrel  (PLAVIX ) 75 MG tablet Take 1 tablet (75 mg total) by mouth daily. 02/21/24   Baloch, Mahnoor, MD  docusate sodium  (COLACE) 100 MG capsule Take 1-2 every few days as needed 01/24/23   Jeanelle Layman CROME, MD  isosorbide  mononitrate (IMDUR ) 60 MG 24 hr tablet TAKE 1 TABLET(60 MG) BY MOUTH DAILY 08/21/24   Court Dorn PARAS, MD  loratadine  (CLARITIN ) 10 MG tablet Take 10 mg by mouth daily as needed for allergies.    [provider]  nitroGLYCERIN  (NITROSTAT ) 0.4 MG SL tablet Place 1 tablet (0.4 mg total) under the tongue every 5 (five) minutes x 3 doses as needed for chest pain. 05/09/23   Daneen Damien BROCKS, NP  pantoprazole  (PROTONIX ) 40 MG tablet Take one daily as needed 08/29/22   Jeanelle Layman CROME, MD  pravastatin  (PRAVACHOL ) 80 MG tablet Take 1 tablet (80 mg total) by mouth at bedtime. 12/04/23   Jeanelle Layman CROME,  MD  tamsulosin  (FLOMAX ) 0.4 MG CAPS capsule TAKE 1 CAPSULE(0.4 MG) BY MOUTH DAILY AFTER SUPPER 02/19/24   Baloch, Mahnoor, MD  Zoster Vaccine Adjuvanted [facility]) injection Inject 0.5 ml IM and Repeat in 2 months 12/11/23   Jeanelle Layman CROME, MD    Allergies: Patient has no known allergies.    Review of Systems  Constitutional:  Negative for chills and fever.  HENT:  Negative for ear pain and sore throat.   Eyes:  Negative for pain and visual disturbance.  Respiratory:  Negative for cough and shortness of breath.   Cardiovascular:  Positive for chest pain. Negative for palpitations.  Gastrointestinal:  Negative for abdominal pain and vomiting.  Genitourinary:  Negative for dysuria and hematuria.  Musculoskeletal:  Negative for arthralgias and back pain.  Skin:  Negative for color change and rash.  Neurological:  Negative for seizures and syncope.  All other systems reviewed and are negative.   Updated Vital Signs BP (!) 151/77 (BP Location: Right Arm)   Pulse (!) 57   Temp 98.3 F (36.8 C) (Oral)   Resp 18   Ht 5' 5 (1.651 m)   SpO2 100%   BMI 25.50 kg/m   Physical Exam Vitals and nursing note reviewed.  Constitutional:      General: He is not in acute distress.    Appearance: He is well-developed.  He is not ill-appearing.  HENT:     Head: Normocephalic and atraumatic.  Eyes:     Conjunctiva/sclera: Conjunctivae normal.  Cardiovascular:     Rate and Rhythm: Normal rate and regular rhythm.     Heart sounds: No murmur heard. Pulmonary:     Effort: Pulmonary effort is normal. No respiratory distress.     Breath sounds: Normal breath sounds.  Abdominal:     Palpations: Abdomen is soft.     Tenderness: There is no abdominal tenderness.  Musculoskeletal:        General: No swelling.     Cervical back: Neck supple.  Skin:    General: Skin is warm and dry.     Capillary Refill: Capillary refill takes less than 2 seconds.  Neurological:     Mental Status: He is  alert.  Psychiatric:        Mood and Affect: Mood normal.     (all labs ordered are listed, but only abnormal results are displayed) Labs Reviewed  CBC - Abnormal; Notable for the following components:      Result Value   Hemoglobin 12.7 (*)    MCHC 29.7 (*)    All other components within normal limits  COMPREHENSIVE METABOLIC PANEL WITH GFR - Abnormal; Notable for the following components:   Glucose, Bld 100 (*)    Total Protein 6.4 (*)    Albumin 3.4 (*)    All other components within normal limits  LIPASE, BLOOD  TROPONIN I (HIGH SENSITIVITY)  TROPONIN I (HIGH SENSITIVITY)    EKG: EKG Interpretation Date/Time:  Wednesday August 21 2024 11:36:07 EDT Ventricular Rate:  58 PR Interval:    QRS Duration:  96 QT Interval:  400 QTC Calculation: 392 R Axis:   -12  Text Interpretation: Atrial flutter with variable A-V block Minimal voltage criteria for LVH, may be normal variant ( R in aVL ) Septal infarct , age undetermined Compared with prior EKG from 01/24/2024 aflutter is new Confirmed by Gennaro Bouchard ([id number]) on 08/21/2024 9:48:53 PM  Radiology: CT ABDOMEN PELVIS W CONTRAST Result Date: 08/21/2024 CLINICAL DATA:  Abdominal pain. EXAM: CT ABDOMEN AND PELVIS WITH CONTRAST TECHNIQUE: Multidetector CT imaging of the abdomen and pelvis was performed using the standard protocol following bolus administration of intravenous contrast. RADIATION DOSE REDUCTION: This exam was performed according to the departmental dose-optimization program which includes automated exposure control, adjustment of the mA and/or kV according to patient size and/or use of iterative reconstruction technique. CONTRAST:  75mL OMNIPAQUE  IOHEXOL  350 MG/ML SOLN COMPARISON:  None Available. FINDINGS: Lower chest: The visualized lung bases are clear. There is coronary vascular calcification. No intra-abdominal free air or free fluid. Hepatobiliary: The liver is unremarkable. No biliary dilatation. The gallbladder  is unremarkable. Pancreas: Unremarkable. No pancreatic ductal dilatation or surrounding inflammatory changes. Spleen: Normal in size without focal abnormality. Adrenals/Urinary Tract: The adrenal glands are unremarkable. There is no hydronephrosis on either side. There is symmetric enhancement and excretion of contrast by both kidneys. The visualized ureters and urinary bladder appear unremarkable. Stomach/Bowel: There is no bowel obstruction or active inflammation. The appendix is normal. Vascular/Lymphatic: Moderate calcified and noncalcified plaque of the abdominal aorta and iliac arteries. There is high-grade narrowing of the right common iliac artery secondary to noncalcified plaque. A linear flap noted in the distal aorta just above the aortic bifurcation which may represent old thrombus/scar or less likely an old dissection flap. Direct comparison with prior images, if available, recommended. The iliac arteries remain  patent. No aortic aneurysm. No periaortic fluid collection. The origins of the celiac axis, SMA, and renal arteries are patent. The origin of the IMA is not identified. No portal venous gas. There is a 1.2 x 1.5 cm indeterminate rounded low density structure 8 year to the left renal vein and adjacent to the distal duodenum (32/2). This is not characterized on this CT. 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 Other etiologies are not excluded. Reproductive: Enlarged prostate gland with median lobe hypertrophy indenting the base of the bladder. Other: None Musculoskeletal: Degenerative changes of the spine. No acute osseous pathology. IMPRESSION: 1. No acute intra-abdominal or pelvic pathology. 2. No bowel obstruction. Normal appendix. 3. High-grade narrowing of the right common iliac artery secondary to noncalcified plaque. 4. Enlarged prostate gland with median lobe hypertrophy indenting the base of the bladder. 5. Indeterminate rounded low  density nodular structure in the left upper abdomen as above. 6.  Aortic Atherosclerosis (ICD10-I70.0). Electronically Signed   By: Vanetta Chou M.D.   On: 08/21/2024 15:03   DG Chest 2 View Result Date: 08/21/2024 CLINICAL DATA:  Chest pain EXAM: CHEST - 2 VIEW COMPARISON:  January 24, 2024 FINDINGS: The heart size and mediastinal contours are within normal limits. Both lungs are clear. The visualized skeletal structures are unremarkable. IMPRESSION: No active cardiopulmonary disease. Electronically Signed   By: Michaeline Blanch M.D.   On: 08/21/2024 12:09     Procedures   Medications Ordered in the ED  iohexol  (OMNIPAQUE ) 350 MG/ML injection 75 mL (75 mLs Intravenous Contrast Given 08/21/24 1451)                                    Medical Decision Making Patient here for chest pain.  It is resolved.  Lab workup reviewed by me and unremarkable.  Troponin is negative.  He had a CT of his abdomen pelvis that was fairly unremarkable except for some stenosis of his groin.  Advised him to follow-up with vascular surgery for this.  Advised to stay on his other medications as prescribed today.  With primary care and cardiology.  Remainder of his workup was negative and vitals are stable.  Advise return to the ER for any new or worsening symptoms.  He feels comfortable with the plan to be discharged home.  All results and plan discussed with patient and family at bedside.  Problems Addressed: Atypical chest pain: acute illness or injury  Amount and/or Complexity of Data Reviewed External Data Reviewed: notes.    Details: Prior outpatient records reviewed and patient follows up regularly for chronic medical conditions with primary care Labs: ordered. Decision-making details documented in ED Course.    Details: Ordered and reviewed by me and unremarkable.  Troponin is negative Radiology: ordered and independent interpretation performed. Decision-making details documented in ED Course.    Details:  Chest x-ray ordered and interpreted independently radiology and shows no acute disease process of the chest  CT abdomen pelvis ordered and reviewed by me and shows some stenosis in the vessel of the groin but no other acute abnormality ECG/medicine tests: ordered and independent interpretation performed. Decision-making details documented in ED Course.    Details: Ordered and interpreted by me in the absence of cardiology and a flutter but rate is controlled, no STEMI  Risk OTC drugs. Prescription drug management.      Final diagnoses:  Atypical chest pain    ED Discharge Orders     None          Gennaro Duwaine CROME, DO 08/21/24 2200

## 2024-09-11 ENCOUNTER — Encounter: Payer: Self-pay | Admitting: Family Medicine

## 2024-09-11 ENCOUNTER — Ambulatory Visit: Admitting: Family Medicine

## 2024-09-11 VITALS — BP 119/68 | HR 75 | Ht 65.0 in | Wt 160.2 lb

## 2024-09-11 DIAGNOSIS — I700 Atherosclerosis of aorta: Secondary | ICD-10-CM | POA: Insufficient documentation

## 2024-09-11 NOTE — Progress Notes (Signed)
    SUBJECTIVE:   CHIEF COMPLAINT / HPI:   Dale [name] is a 83yo M that pf ED f/u. - Seen in ED 9/10 for atypical chest pain, EKG, trop, and CTAP at the time were neg and pain self-resolved.  - CTAP showed high grade narrowing of R common iliac artery. - Reports occasional R leg cramping with activity - Denies hx of R LE wounds. - Denies any more chest pain or SOB - Has ha hx of CAD  PERTINENT  PMH / PSH: CAD. GERD, BPH  OBJECTIVE:   BP 119/68   Pulse 75   Ht 5' 5 (1.651 m)   Wt 160 lb 3.2 oz (72.7 kg)   SpO2 100%   BMI 26.66 kg/m   General: Alert, pleasant nab. NAD. HEENT: NCAT. MMM. CV: RRR, no murmurs.   Resp: CTAB, no wheezing or crackles. Normal WOB on RA.   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 Incidentally noted on CTAP. Has hx of NSTEMI and cardiovascular risk factors.  - Will check ABI to monitor blood flow - Vascular surgery referral    Dale Nearing, MD [facility]

## 2024-09-11 NOTE — Assessment & Plan Note (Signed)
 R common iliac. Incidentally noted on CTAP. Has hx of NSTEMI and cardiovascular risk factors.  - Will check ABI to monitor blood flow - Vascular surgery referral

## 2024-09-11 NOTE — Patient Instructions (Signed)
 1) I will send you a referral to see a Vascular surgeon  2) We will also check your blood flow with an ABI. They will call you to schedule this procedure.

## 2024-09-12 ENCOUNTER — Encounter (HOSPITAL_COMMUNITY): Payer: Self-pay

## 2024-09-12 ENCOUNTER — Other Ambulatory Visit: Payer: Self-pay | Admitting: Family Medicine

## 2024-09-12 DIAGNOSIS — I700 Atherosclerosis of aorta: Secondary | ICD-10-CM

## 2024-09-24 ENCOUNTER — Ambulatory Visit (HOSPITAL_COMMUNITY)
Admission: RE | Admit: 2024-09-24 | Discharge: 2024-09-24 | Disposition: A | Discharge status: Home / Self Care | Source: Ambulatory Visit | Attending: Family Medicine | Admitting: Family Medicine

## 2024-09-24 DIAGNOSIS — I700 Atherosclerosis of aorta: Secondary | ICD-10-CM | POA: Insufficient documentation

## 2024-09-24 LAB — VAS US ABI WITH/WO TBI
Left ABI: 0.94
Right ABI: 0.88

## 2024-09-26 ENCOUNTER — Ambulatory Visit: Payer: Self-pay | Admitting: Family Medicine

## 2024-09-27 ENCOUNTER — Encounter (HOSPITAL_COMMUNITY)

## 2024-09-28 ENCOUNTER — Other Ambulatory Visit: Payer: Self-pay | Admitting: Family Medicine

## 2024-09-29 ENCOUNTER — Other Ambulatory Visit: Payer: Self-pay | Admitting: Family Medicine

## 2024-10-02 ENCOUNTER — Ambulatory Visit: Attending: Cardiovascular Disease | Admitting: Cardiovascular Disease

## 2024-10-02 ENCOUNTER — Encounter: Payer: Self-pay | Admitting: Cardiovascular Disease

## 2024-10-02 VITALS — BP 102/62 | HR 70 | Ht 65.0 in | Wt 161.8 lb

## 2024-10-02 DIAGNOSIS — I214 Non-ST elevation (NSTEMI) myocardial infarction: Secondary | ICD-10-CM | POA: Diagnosis not present

## 2024-10-02 DIAGNOSIS — I739 Peripheral vascular disease, unspecified: Secondary | ICD-10-CM | POA: Insufficient documentation

## 2024-10-02 DIAGNOSIS — E785 Hyperlipidemia, unspecified: Secondary | ICD-10-CM

## 2024-10-02 NOTE — Assessment & Plan Note (Signed)
 Patient complains of right lower extremity claudication.  He did have Doppler studies performed 09/24/2024 revealing a right ABI of 0.88 and a left of 0.94.  I am going to get aortoiliac and lower extremity arterial Doppler studies to further evaluate.

## 2024-10-02 NOTE — Addendum Note (Signed)
 Addended by: LORRENE FEDERICO CROME on: 10/02/2024 11:30 AM   Modules accepted: Orders

## 2024-10-02 NOTE — Assessment & Plan Note (Signed)
 History of dyslipidemia on statin therapy with lipid profile performed 01/20/2021 revealing total cholesterol 185, LDL of 70 and HDL 59.  I am going to repeat a fasting lipid liver profile.

## 2024-10-02 NOTE — Progress Notes (Signed)
 10/02/2024 MEGAN HAYDUK Sr.   14-Jul-1941  [id number]  Primary Physician Baloch, Mahnoor, MD Primary Cardiologist: Dorn JINNY Lesches MD [facility] [place], [place]  HPI:  Morton Dale Pollock Sr. is a 83 y.o.   thin appearing widowed African-American male father of 2 children, grandfather of 3 grandchildren who is accompanied by his daughter Lorenza today.  I last saw him in the office 11/27/2023.  I first met him at the time of his non-STEMI 12/30/16. 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 The remainder of his coronary anatomy was free of significant disease and his LV function was normal. His other problems include a history of hyperlipidemia and remote tobacco abuse. He was discharged home 2 days later.   Since I saw him almost 3 years ago he is remained stable.  He denies chest pain or shortness of breath.  He still rides his bike and does yard work, mowing his lawn, without symptoms.  He does complain of some lower extremity claudication mostly on the right side which is new since I last saw him.  He had ABIs performed 09/24/2024 revealing right ABI of 0.88 and a left of 0.94.   Current Meds  Medication Sig   aspirin  81 MG chewable tablet Chew 1 tablet (81 mg total) by mouth daily.   Blood Pressure Monitoring (COMFORT TOUCH BP CUFF/MEDIUM) MISC Use daily   clopidogrel  (PLAVIX ) 75 MG tablet Take 1 tablet (75 mg total) by mouth daily.   docusate sodium  (COLACE) 100 MG capsule Take 1-2 every few days as needed   isosorbide  mononitrate (IMDUR ) 60 MG 24 hr tablet TAKE 1 TABLET(60 MG) BY MOUTH DAILY   loratadine  (CLARITIN ) 10 MG tablet Take 10 mg by mouth daily as needed for allergies.   memantine  (NAMENDA ) 10 MG tablet TAKE 1 TABLET BY MOUTH TWICE DAILY   nitroGLYCERIN  (NITROSTAT ) 0.4 MG SL tablet Place 1 tablet (0.4 mg total) under the tongue every  5 (five) minutes x 3 doses as needed for chest pain.   pantoprazole  (PROTONIX ) 40 MG tablet Take one daily as needed   pravastatin  (PRAVACHOL ) 80 MG tablet Take 1 tablet (80 mg total) by mouth at bedtime.   tamsulosin  (FLOMAX ) 0.4 MG CAPS capsule TAKE 1 CAPSULE(0.4 MG) BY MOUTH DAILY AFTER SUPPER   Zoster Vaccine Adjuvanted (SHINGRIX) injection Inject 0.5 ml IM and Repeat in 2 months     No Known Allergies  Social History   Socioeconomic History   Marital status: Widowed    Spouse name: Not on file   Number of children: 2   Years of education: 6th   Highest education level: 5th grade  Occupational History   Occupation: Retired  Tobacco Use   Smoking status: Former    Current packs/day: 0.00    Average packs/day: 1 pack/day for 20.0 years (20.0 ttl pk-yrs)    Types: Cigarettes    Start date: 10/23/1991    Quit date: 10/23/2011    Years since quitting: 12.9   Smokeless tobacco: Never  Vaping Use   Vaping status: Never Used  Substance and Sexual Activity   Alcohol use: No    Comment: occ   Drug use: Not Currently   Sexual activity: Not Currently    Comment: Patient would like to be. Discussed wearing condom if becomes sexually active  Other Topics Concern   Not on  file  Social History Narrative   Patient lives in his home with his daughter.   Patient is a widow ~ 10 years now.   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    Patient enjoys gardening and staying active outside.    Patient enjoys spending time with his family and going to the restaurant down the street.   Right handed   One floor home   Social Drivers of Health   Financial Resource Strain: Low Risk  (10/29/2020)   Overall Financial Resource Strain (CARDIA)    Difficulty of Paying Living Expenses: Not hard at all  Food Insecurity: No Food Insecurity (01/14/2023)   Hunger Vital Sign    Worried About Running Out of Food in the Last Year: Never true    Ran Out of Food in  the Last Year: Never true  Transportation Needs: No Transportation Needs (01/14/2023)   PRAPARE - Administrator, Civil Service (Medical): No    Lack of Transportation (Non-Medical): No  Physical Activity: Sufficiently Active (10/29/2020)   Exercise Vital Sign    Days of Exercise per Week: 7 days    Minutes of Exercise per Session: 60 min  Stress: No Stress Concern Present (10/29/2020)   [employer] of Occupational Health - Occupational Stress Questionnaire    Feeling of Stress : Only a little  Social Connections: Socially Isolated (10/29/2020)   Social Connection and Isolation Panel    Frequency of Communication with Friends and Family: More than three times a week    Frequency of Social Gatherings with Friends and Family: More than three times a week    Attends Religious Services: Never    Database administrator or Organizations: No    Attends Banker Meetings: Never    Marital Status: Widowed  Intimate Partner Violence: Not At Risk (10/29/2020)   Humiliation, Afraid, Rape, and Kick questionnaire    Fear of Current or Ex-Partner: No    Emotionally Abused: No    Physically Abused: No    Sexually Abused: No     Review of Systems: General: negative for chills, fever, night sweats or weight changes.  Cardiovascular: negative for chest pain, dyspnea on exertion, edema, orthopnea, palpitations, paroxysmal nocturnal dyspnea or shortness of breath Dermatological: negative for rash Respiratory: negative for cough or wheezing Urologic: negative for hematuria Abdominal: negative for nausea, vomiting, diarrhea, bright red blood per rectum, melena, or hematemesis Neurologic: negative for visual changes, syncope, or dizziness All other systems reviewed and are otherwise negative except as noted above.    Blood pressure 102/62, pulse 70, height 5' 5 (1.651 m), weight 161 lb 12.8 oz (73.4 kg), SpO2 99%.  General appearance: alert and no distress Neck: no  adenopathy, no carotid bruit, no JVD, supple, symmetrical, trachea midline, and thyroid  not enlarged, symmetric, no tenderness/mass/nodules Lungs: clear to auscultation bilaterally Heart: regular rate and rhythm, S1, S2 normal, no murmur, click, rub or gallop Extremities: extremities normal, atraumatic, no cyanosis or edema Pulses: 2+ and symmetric Skin: Skin color, texture, turgor normal. No rashes or lesions Neurologic: Grossly normal  EKG not performed today      ASSESSMENT AND PLAN:   Dyslipidemia History of dyslipidemia on statin therapy with lipid profile performed 01/20/2021 revealing total cholesterol 185, LDL of 70 and HDL 59.  I am going to repeat a fasting lipid liver profile.  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  I performed cardiac catheterization  on him via right radial approach revealing an occluded circumflex in the proximal AV groove which I stented using a Medtronic drug-eluting stent.  The remainder of his coronary anatomy was unremarkable.  He had normal LV function.  He remains on dual antiplatelet therapy.  He did have a Myoview  stress test performed 01/25/2023 that was low risk and nonischemic with a small scar.  Claudication in peripheral vascular disease Patient complains of right lower extremity claudication.  He did have Doppler studies performed 09/24/2024 revealing a right ABI of 0.88 and a left of 0.94.  I am going to get aortoiliac and lower extremity arterial Doppler studies to further evaluate.     Dorn DOROTHA Lesches MD FACP,FACC,FAHA, [facility] 10/02/2024 11:23 AM

## 2024-10-02 NOTE — Assessment & Plan Note (Signed)
 History of non-STEMI 12/30/2016.  I performed cardiac catheterization on him via right radial approach revealing an occluded circumflex in the proximal AV groove which I stented using a Medtronic drug-eluting stent.  The remainder of his coronary anatomy was unremarkable.  He had normal LV function.  He remains on dual antiplatelet therapy.  He did have a Myoview  stress test performed 01/25/2023 that was low risk and nonischemic with a small scar.

## 2024-10-02 NOTE — Patient Instructions (Addendum)
 Medication Instructions:  Your physician recommends that you continue on your current medications as directed. Please refer to the Current Medication list given to you today.  *If you need a refill on your cardiac medications before your next appointment, please call your pharmacy*  Lab Work: Your physician recommends that you return for lab work in: The next week or 2 for FASTING lipid/liver panel  If you have labs (blood work) drawn today and your tests are completely normal, you will receive your results only by: MyChart Message (if you have MyChart) OR A paper copy in the mail If you have any lab test that is abnormal or we need to change your treatment, we will call you to review the results.   Testing/Procedures: Your physician has requested that you have an Aorta/Iliac Duplex. This will take place at [address], 4th floor  No food after 11PM the night before.  Water is OK. (Don't drink liquids if you have been instructed not to for ANOTHER test) Avoid foods that produce bowel gas, for 24 hours prior to exam (see below). No breakfast, no chewing gum, no smoking or carbonated beverages. Patient may take morning medications with water. Come in for test at least 15 minutes early to register.  Please note: We ask at that you not bring children with you during ultrasound (echo/ vascular) testing. Due to room size and safety concerns, children are not allowed in the ultrasound rooms during exams. Our front office staff cannot provide observation of children in our lobby area while testing is being conducted. An adult accompanying a patient to their appointment will only be allowed in the ultrasound room at the discretion of the ultrasound technician under special circumstances. We apologize for any inconvenience.  Your physician has requested that you have a lower extremity arterial duplex. During this test, ultrasound is used to evaluate arterial blood flow in the legs. Allow one hour  for this exam. There are no restrictions or special instructions. This will take place at [address] 4th floor  Please note: We ask at that you not bring children with you during ultrasound (echo/ vascular) testing. Due to room size and safety concerns, children are not allowed in the ultrasound rooms during exams. Our front office staff cannot provide observation of children in our lobby area while testing is being conducted. An adult accompanying a patient to their appointment will only be allowed in the ultrasound room at the discretion of the ultrasound technician under special circumstances. We apologize for any inconvenience.  Your physician has requested that you have an ankle brachial index (ABI). During this test an ultrasound and blood pressure cuff are used to evaluate the arteries that supply the arms and legs with blood. Allow thirty minutes for this exam. There are no restrictions or special instructions. This will take place at [address] 4th floor   Please note: We ask at that you not bring children with you during ultrasound (echo/ vascular) testing. Due to room size and safety concerns, children are not allowed in the ultrasound rooms during exams. Our front office staff cannot provide observation of children in our lobby area while testing is being conducted. An adult accompanying a patient to their appointment will only be allowed in the ultrasound room at the discretion of the ultrasound technician under special circumstances. We apologize for any inconvenience.   Follow-Up: At [facility], you and your health needs are our priority.  As part of our continuing mission  to provide you with exceptional heart care, our providers are all part of one team.  This team includes your primary Cardiologist (physician) and Advanced Practice Providers or APPs (Physician Assistants and Nurse Practitioners) who all work together to provide you with the care you need, when you  need it.  Your next appointment:   12 month(s)  Provider:   Dorn Lesches, MD

## 2024-11-01 ENCOUNTER — Ambulatory Visit (HOSPITAL_COMMUNITY)
Admission: RE | Admit: 2024-11-01 | Discharge: 2024-11-01 | Disposition: A | Discharge status: Home / Self Care | Source: Ambulatory Visit | Attending: Cardiovascular Disease | Admitting: Cardiovascular Disease

## 2024-11-01 ENCOUNTER — Ambulatory Visit: Payer: Self-pay | Admitting: Cardiovascular Disease

## 2024-11-01 ENCOUNTER — Ambulatory Visit (HOSPITAL_COMMUNITY)

## 2024-11-01 DIAGNOSIS — I739 Peripheral vascular disease, unspecified: Secondary | ICD-10-CM | POA: Insufficient documentation

## 2024-11-01 DIAGNOSIS — E785 Hyperlipidemia, unspecified: Secondary | ICD-10-CM | POA: Diagnosis not present

## 2024-11-01 DIAGNOSIS — I214 Non-ST elevation (NSTEMI) myocardial infarction: Secondary | ICD-10-CM | POA: Diagnosis not present

## 2024-11-01 NOTE — Progress Notes (Incomplete)
 Assessment and Plan Assessment & Plan        Dementia likely due to Alzheimer's disease   Continue memantine  10 mg twice daily, side effects discussed (bradycardia) Follow-up in 6 months Recommend good control of cardiovascular risk factors Continue to control mood as per PCP   Dale GORMAN Pollock Sr. is a very pleasant 83 y.o. 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 Recently patient placed again on memantine  10 mg bid by PCP (unable to tolerate donepezil  due to bradycardia) seen today in follow up for memory loss. Patient is currently on memantine  10 mg twice daily***.  This patient is accompanied in the office by *** who supplements the history.  Previous records as well as any outside records available were reviewed prior to todays visit. Patient was last seen on ***  Discussed the use of AI scribe software for clinical note transcription with the patient, who gave verbal consent to proceed.  History of Present Illness      Any changes in memory since last visit?  repeats oneself?  Endorsed Disoriented when walking into a room? Denies ***  Leaving objects?  May misplace things but not in unusual places***  Wandering behavior?  denies   Any personality changes since last visit?  Denies.   Any worsening depression?:  Denies.   Hallucinations or paranoia?  Denies.   Seizures? denies    Any sleep changes?  Denies vivid dreams, REM behavior or sleepwalking   Sleep apnea?   Denies.   Any hygiene concerns? Denies.  Independent of bathing and dressing?  Endorsed  Does the patient needs help with medications?   is  in charge *** Who is in charge of the finances?   is in charge   *** Any changes in appetite?  denies ***   Patient have trouble swallowing? Denies.   Does the patient cook? No Any headaches?   denies   Any vision changes?*** Chronic back pain  denies   Ambulates with difficulty? Denies.  *** Recent falls or head injuries? Denies.     Unilateral weakness, numbness or tingling? denies   Any tremors?  Denies  *** Any anosmia?  Denies   Any incontinence of urine?  Endorsed***  Any bowel dysfunction?   Denies      Patient lives   *** Does the patient drive? No longer drives ***  Initial visit 03/28/2024 How long did patient have memory difficulties?  For about 10 years, progressive.  Patient reports some difficulty remembering new information, recent conversations, names. LTM is involved as well.  Enjoys  gardening, does not do any brain stimulating exercises repeats oneself?  Endorsed Disoriented when walking into a room?   Endorsed, he tells his daughter Who lives in this house?.  Leaving objects in unusual places? Endorsed, for example keys in a coat pocket, may find his phone in a toilet paper holder  Wandering behavior? Endorsed, at times. 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  Any  depression, anxiety? No depression but anxiety-daughter says.  Hallucinations or paranoia?   + paranoia, feeling like people are talking about him, seeing people on TV talking to him.  Seizures?  Denies.    Any sleep changes?  Sleeps better after being more active during the day doing yard work . Denies vivid dreams, REM behavior or sleepwalking   Sleep apnea? Denies.   Any hygiene concerns? Endorsed, needs to be reminded quite often   Independent of bathing and dressing? Endorsed  Does the patient need help with medications? Daughter is in charge   Who is in charge of the finances? Son is in charge     Any changes in appetite?   Denies. Great appetite. Drinks plenty water   Patient have  trouble swallowing?  Denies.   Does the patient cook? No  Any headaches?  Denies.   Chronic pain? Denies. History of cervical spondylosis but denies neck pain   Ambulates with difficulty? Denies.   He likes to ride his bike Recent falls or head injuries? Denies.     Vision changes?  Denies any new issues.  Has a history of cataracts that may be bothering him, has ophthalmology seeing him soon   Any strokelike symptoms? Denies.   Any tremors? Denies.   Any anosmia? Denies.   Any incontinence of urine? urge incontinence, does not wear pads or diapers Any bowel dysfunction? Denies.      Patient lives with his daughter   History of heavy alcohol intake? Denies.   History of heavy tobacco use? Denies.  Quit 15 years ago. Family history of dementia?  Father, all his siblings and his cousins  had Alzheimer's dementia  Does patient drive? No, after his truck broke-daughter says   Retired from working for [employer]  6th grade     MRI of the brain read on 04/22/2024, personally reviewed, remarkable for mild diffuse cerebral volume loss, mild chronic microvascular ischemia, cerebral and hippocampal atrophy. No acute findings were noted.   CURRENT MEDICATIONS:  Outpatient Encounter Medications as of 11/04/2024  Medication Sig   aspirin  81 MG chewable tablet Chew 1 tablet (81 mg total) by mouth daily.   Blood Pressure Monitoring (COMFORT TOUCH BP CUFF/MEDIUM) MISC Use daily   clopidogrel  (PLAVIX ) 75 MG tablet Take 1 tablet (75 mg total) by mouth daily.   docusate sodium  (COLACE) 100 MG capsule Take 1-2 every few days as needed   isosorbide  mononitrate (IMDUR ) 60 MG 24 hr tablet TAKE 1 TABLET(60 MG) BY MOUTH DAILY   loratadine  (CLARITIN ) 10 MG tablet Take 10 mg by mouth daily as needed for allergies.   memantine  (NAMENDA ) 10 MG tablet TAKE 1 TABLET BY MOUTH TWICE DAILY   nitroGLYCERIN  (NITROSTAT ) 0.4 MG SL tablet Place 1 tablet (0.4 mg total) under the tongue every 5 (five) minutes x 3  doses as needed for chest pain.   pantoprazole  (PROTONIX ) 40 MG tablet Take one daily as needed   pravastatin  (PRAVACHOL ) 80 MG tablet Take 1 tablet (80 mg total) by mouth at bedtime.   tamsulosin  (FLOMAX ) 0.4 MG CAPS capsule TAKE 1 CAPSULE(0.4 MG) BY MOUTH DAILY AFTER SUPPER   Zoster Vaccine Adjuvanted (SHINGRIX) injection Inject 0.5 ml IM and Repeat in 2 months   No facility-administered encounter medications on file as of 11/04/2024.       03/28/2024   12:00 PM 09/26/2018    9:28 AM 03/27/2018    2:13 PM  MMSE - Mini Mental State Exam  Not completed:  --   Orientation to time 0 2 4  Orientation to Place 3 3 3   Registration 3 3 3   Attention/ Calculation 0 2 0  Recall 0 2  1  Language- name 2 objects 2 2 2   Language- repeat 1 1 1   Language- follow 3 step command 2 3 3   Language- read & follow direction 1 0 1  Write a sentence 0 0 1  Copy design 0 0 0  Total score 12 18 19        No data to display          Results     Objective:    Neurological Exam:    VITALS:  There were no vitals filed for this visit.  GEN:  The patient appears stated age and is in NAD. HEENT:  Normocephalic, atraumatic.   Neurological examination:  General: NAD, well-groomed, appears stated age. Orientation: The patient is alert. Oriented to person, place and date Cranial nerves: There is good facial symmetry.The speech is fluent and clear. No aphasia or dysarthria. Fund of knowledge is appropriate. Recent and remote memory are impaired. Attention and concentration are reduced. Able to name objects and repeat phrases.  Hearing is intact to conversational tone. *** Sensation: Sensation is intact to light touch throughout Motor: Strength is at least antigravity x4. DTR's 2/4 in UE/LE     Movement examination: Tone: There is normal tone in the UE/LE Abnormal movements:  no tremor.  No myoclonus.  No asterixis.   Coordination:  There is no decremation with RAM's. Normal finger to nose  Gait  and Station: The patient has no*** difficulty arising out of a deep-seated chair without the use of the hands. The patient's stride length is good.  Gait is cautious and narrow.    Thank you for allowing us  the opportunity to participate in the care of this nice patient. Please do not hesitate to contact us  for any questions or concerns.   Total time spent on today's visit was *** minutes dedicated to this patient today, preparing to see patient, examining the patient, ordering tests and/or medications and counseling the patient, documenting clinical information in the EHR or other health record, independently interpreting results and communicating results to the patient/family, discussing treatment and goals, answering patient's questions and coordinating care.  Cc:  Lonnie Earnest, MD  Camie Sevin 11/01/2024 6:48 AM

## 2024-11-02 LAB — LIPID PANEL
Chol/HDL Ratio: 2.2 ratio (ref 0.0–5.0)
Cholesterol, Total: 151 mg/dL (ref 100–199)
HDL: 70 mg/dL (ref >39)
LDL Chol Calc (NIH): 67 mg/dL (ref 0–99)
Triglycerides: 74 mg/dL (ref 0–149)
VLDL Cholesterol Cal: 14 mg/dL (ref 5–40)

## 2024-11-02 LAB — HEPATIC FUNCTION PANEL
ALT: 10 IU/L (ref 0–44)
AST: 13 IU/L (ref 0–40)
Albumin: 4.2 g/dL (ref 3.7–4.7)
Alkaline Phosphatase: 102 IU/L (ref 48–129)
Bilirubin Total: 0.5 mg/dL (ref 0.0–1.2)
Bilirubin, Direct: 0.18 mg/dL (ref 0.00–0.40)
Total Protein: 6.7 g/dL (ref 6.0–8.5)

## 2024-11-04 ENCOUNTER — Encounter: Payer: Self-pay | Admitting: Physician Assistant

## 2024-11-04 ENCOUNTER — Ambulatory Visit: Admitting: Physician Assistant

## 2024-11-18 ENCOUNTER — Encounter: Payer: Self-pay | Admitting: Cardiovascular Disease

## 2024-11-18 ENCOUNTER — Ambulatory Visit: Attending: Cardiovascular Disease | Admitting: Cardiovascular Disease

## 2024-11-18 VITALS — BP 96/52 | HR 61 | Ht 65.0 in | Wt 165.0 lb

## 2024-11-18 DIAGNOSIS — E785 Hyperlipidemia, unspecified: Secondary | ICD-10-CM

## 2024-11-18 DIAGNOSIS — I2510 Atherosclerotic heart disease of native coronary artery without angina pectoris: Secondary | ICD-10-CM | POA: Diagnosis not present

## 2024-11-18 NOTE — Progress Notes (Signed)
 Mr. Dale [name] returns today with his daughter Dale [name]  to discuss his Doppler studies which were abnormal.  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  We will revisit this in the spring when he is out walking again to decide whether or not he requires intervention.  Dorn DOROTHA Lesches, M.D., FACP, [facility], FAHA, [facility]  [address], [address] [place], [place]  [address]  [phone] 11/18/2024 11:12 AM

## 2024-11-18 NOTE — Patient Instructions (Signed)
 Medication Instructions:  Your physician recommends that you continue on your current medications as directed. Please refer to the Current Medication list given to you today.  *If you need a refill on your cardiac medications before your next appointment, please call your pharmacy*  Follow-Up: At [facility], you and your health needs are our priority.  As part of our continuing mission to provide you with exceptional heart care, our providers are all part of one team.  This team includes your primary Cardiologist (physician) and Advanced Practice Providers or APPs (Physician Assistants and Nurse Practitioners) who all work together to provide you with the care you need, when you need it.  Your next appointment:   4 month(s)  Provider:   Lauro Portal, MD    We recommend signing up for the patient portal called "MyChart".  Sign up information is provided on this After Visit Summary.  MyChart is used to connect with patients for Virtual Visits (Telemedicine).  Patients are able to view lab/test results, encounter notes, upcoming appointments, etc.  Non-urgent messages can be sent to your provider as well.   To learn more about what you can do with MyChart, go to [email or web address].

## 2024-11-19 ENCOUNTER — Telehealth: Payer: Self-pay | Admitting: Family Medicine

## 2024-11-19 NOTE — Telephone Encounter (Signed)
 Patients son came in asking for a paper stating how bad his patients dementia is and needing it to be signed by his doctor as well. Please place in front for pick up.  Patients son's name is Morton RADDLE. And phone number is  [phone].  Thank you!

## 2024-11-20 ENCOUNTER — Telehealth: Payer: Self-pay | Admitting: Physician Assistant

## 2024-11-20 NOTE — Telephone Encounter (Signed)
 Tayton Decaire (pt's son) has been informed. Declined appointment with pcp for now. Cassell Mary CMA

## 2024-11-20 NOTE — Telephone Encounter (Signed)
 No DPR on file. Will have to have this scanned into chart.

## 2024-11-20 NOTE — Telephone Encounter (Signed)
 [name]( Son) stating that he is needing documentation regarding his dad's condition for social security. He stated that the Doctors signature will be needed and to also include the address.   PH: [phone]

## 2024-11-20 NOTE — Telephone Encounter (Signed)
 No recent DPR on file, asked son to bring it by to be scanned into chart and to let clinical staff know once it is completed.

## 2024-11-21 NOTE — Telephone Encounter (Signed)
 Power of Gabriella has been uploaded.

## 2024-11-22 ENCOUNTER — Encounter: Payer: Self-pay | Admitting: Physician Assistant

## 2024-12-08 NOTE — Progress Notes (Signed)
 "   Mild Cognitive Impairment with concern for Alzheimer's disease    Dale BACHUS Sr. is a very pleasant 83 y.o. 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 Recently patient placed again on memantine  10 mg bid by PCP (unable to tolerate donepezil  due to bradycardia) seen today to discuss the results of the MRI of the brain read on 04/22/2024, personally reviewed, remarkable for mild diffuse cerebral volume loss, mild chronic microvascular ischemia, cerebral and hippocampal atrophy.  No acute findings were noted  presenting today in follow-up for evaluation of memory loss. Patient is on ***. Patient was last seen on ***. Memory is ***. MMSE today is  /30. Patient is able to participate on ADLs and to to drive without difficulties. Mood is ***. This patient is accompanied in the office by ***  who supplements the history. Previous records as well as any outside records available were reviewed prior to todays visit   Continue Memantine  10 mg twice daily. Side effects were discussed  Continue to control mood as per PCP Recommend good control of cardiovascular risk factors Fol[place]ow up in  months***     Discussed the use of AI scribe software for clinical note transcription with the patient, who gave verbal consent to proceed.  History of Present Illness Dale STRUCKMAN Sr. is an 83 year old male with dementia who presents for a follow-up on memory and cognitive function.  Since the last visit on May 23, his memory has shown improvement, although he recalls an incident of getting lost. 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 He does not have difficulty recognizing people, though he sometimes requires prompting. He enjoys gardening, but cold weather disrupts this routine. He often becomes agitated or empty at sundown and family members need to remind him that he is home. He sometimes perceives people on TV as talking about him.  He experiences moments of anxiety, which can lead to irritability depending on the topic of conversation. He no longer misplaces items like keys or phones. However, he often does not recognize his own house and frequently repeats himself. He occasionally needs reminders to shower or change clothes but generally manages his hygiene well. He sleeps well, aided by a magnesium spray, and his appetite is described as 'amazing for his age,' although he sometimes forgets he has eaten.  He experiences urinary urgency and occasional incontinence but does not wear diapers. He takes memantine  10 mg twice a day for memory. His daughter manages his medications and his son manages his finances. 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 He has a history of bradycardia, which led to discontinuation of donepezil  in 2020. He occasionally requires a stool softener to prevent constipation.  Initial visit 03/28/2024 How long did patient have memory difficulties?  For about 10 years, progressive.  Patient reports some difficulty remembering new information, recent conversations, names. LTM is involved as well.  Enjoys  gardening, does not do any brain stimulating exercises repeats oneself?  Endorsed Disoriented when walking into a room?   Endorsed, he tells his  daughter Who lives in this house?.  Leaving objects in unusual places? Endorsed, for example keys in a coat pocket, may find his phone in a toilet paper holder  Wandering behavior? Endorsed, at times. 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  Any  depression, anxiety? No depression but anxiety-daughter says.   Hallucinations or paranoia?   + paranoia, feeling like people are talking about him, seeing people on TV talking to him.  Seizures? Denies.    Any sleep changes?  Sleeps better after being more active during the day doing yard work . Denies vivid dreams, REM behavior or sleepwalking   Sleep apnea? Denies.   Any hygiene concerns? Endorsed, needs to be reminded quite often   Independent of bathing and dressing? Endorsed  Does the patient need help with medications? Daughter is in charge   Who is in charge of the finances? Son is in charge     Any changes in appetite?   Denies. Great appetite. Drinks plenty water   Patient have trouble swallowing?  Denies.   Does the patient cook? No  Any headaches?  Denies.   Chronic pain? Denies. History of cervical spondylosis but denies neck pain   Ambulates with difficulty? Denies.   He likes to ride his bike Recent falls or head injuries? Denies.     Vision changes?  Denies any new issues.  Has a history of cataracts that may be bothering him, has ophthalmology seeing him soon   Any strokelike symptoms? Denies.   Any tremors? Denies.   Any anosmia? Denies.   Any incontinence of urine? urge incontinence, does not wear pads or diapers Any bowel dysfunction? Denies.      Patient lives with his daughter   History of heavy alcohol intake? Denies.   History of heavy tobacco use? Denies.  Quit 15 years ago. Family history of dementia?  Father, all his siblings and his cousins  had Alzheimer's dementia  Does patient drive? No, after his truck broke-daughter says   Retired from working for [employer]  6th grade      Past Medical History:  Diagnosis Date   Coronary artery disease    Dementia (HCC)    GERD (gastroesophageal reflux disease)    Hyperlipemia    Leg mass    rt   Memory loss    Myocardial infarction less than 4 weeks ago (HCC) 12/29/2016   Prostate disease    Quit smoking 11/20/2008   Qualifier: Diagnosis of  By:  Jeanelle MD, Layman KUGEL angioplasty with stent to LCX      Past Surgical History:  Procedure Laterality Date   CARDIAC CATHETERIZATION N/A 12/30/2016   Procedure: Coronary Stent Intervention;  Surgeon: Dorn JINNY Lesches, MD;  Location: [facility] INVASIVE CV LAB;  Service: Cardiovascular;  Laterality: N/A;   CARDIAC CATHETERIZATION N/A 12/30/2016   Procedure: Coronary Angiography;  Surgeon: Dorn JINNY Lesches, MD;  [facility] INVASIVE CV LAB;  Service: Cardiovascular;  Laterality: N/A;   CARDIAC CATHETERIZATION N/A 12/30/2016   Procedure: Coronary Balloon Angioplasty;  Surgeon: Dorn JINNY Lesches, MD;  Location: [facility] INVASIVE CV LAB;  Service: Cardiovascular;  Laterality: N/A;   CORONARY STENT PLACEMENT           Objective:     PHYSICAL EXAMINATION:    VITALS:   Vitals:   12/10/24 1443  BP: 134/70  Pulse: 62  Resp: 20  SpO2: 98%  Weight: 162 lb (73.5 kg)  Height: 5' 5 (1.651 m)  GEN:  The patient appears stated age and is in NAD. HEENT:  Normocephalic, atraumatic.   Neurological examination:  General: NAD, well-groomed, appears stated age. Orientation: The patient is alert. Oriented to person, place and not to date.*** Cranial nerves: There is good facial symmetry.The speech is fluent and clear. No aphasia or dysarthria. Fund of knowledge is appropriate. Recent memory impaired and remote memory is normal.  Attention and concentration are normal.  Able to name objects and repeat phrases.  Hearing is intact to conversational tone ***.   Delayed recall *** Sensation: Sensation is intact to light touch throughout Motor: Strength is at least antigravity x4. DTR's 2/4 in UE/LE       No data to display             03/28/2024   12:00 PM 09/26/2018    9:28 AM 03/27/2018    2:13 PM  MMSE - Mini Mental State Exam  Not completed:  --   Orientation to time 0 2 4  Orientation to Place 3 3 3   Registration 3 3 3   Attention/ Calculation 0 2 0  Recall 0 2 1  Language- name 2  objects 2 2 2   Language- repeat 1 1 1   Language- follow 3 step command 2 3 3   Language- read & follow direction 1 0 1  Write a sentence 0 0 1  Copy design 0 0 0  Total score 12 18 19       Movement examination: Tone: There is normal tone in the UE/LE Abnormal movements:  no tremor.  No myoclonus.  No asterixis.   Coordination:  There is no decremation with RAM's. Normal finger to nose  Gait and Station: The patient has no difficulty arising out of a deep-seated chair without the use of the hands. The patient's stride length is good.  Gait is cautious and narrow.   Thank you for allowing us  the opportunity to participate in the care of this nice patient. Please do not hesitate to contact us  for any questions or concerns.   Total time spent on today's visit was *** minutes dedicated to this patient today, preparing to see patient, examining the patient, ordering tests and/or medications and counseling the patient, documenting clinical information in the EHR or other health record, independently interpreting results and communicating results to the patient/family, discussing treatment and goals, answering patient's questions and coordinating care.  Cc:  Lonnie Earnest, MD  Camie Sevin 12/10/2024 3:13 PM      "

## 2024-12-10 ENCOUNTER — Ambulatory Visit: Admitting: Physician Assistant

## 2024-12-10 ENCOUNTER — Encounter: Payer: Self-pay | Admitting: Physician Assistant

## 2024-12-10 VITALS — BP 134/70 | HR 62 | Resp 20 | Ht 65.0 in | Wt 162.0 lb

## 2024-12-10 DIAGNOSIS — F03918 Unspecified dementia, unspecified severity, with other behavioral disturbance: Secondary | ICD-10-CM

## 2024-12-10 NOTE — Patient Instructions (Signed)
 It was a pleasure to see you today at our office.   Recommendations:  Continue memantine  10 mg twice a day  Follow up in 6 months Recommend visiting the website :  Dementia Success Path to better understand some behaviors related to memory loss.    https://www.barrowneuro.org/resource/neuro-rehabilitation-apps-and-games/   RECOMMENDATIONS FOR ALL PATIENTS WITH MEMORY PROBLEMS: 1. Continue to exercise (Recommend 30 minutes of walking everyday, or 3 hours every week) 2. Increase social interactions - continue going to [place] and enjoy social gatherings with friends and family 3. Eat healthy, avoid fried foods and eat more fruits and vegetables 4. Maintain adequate blood pressure, blood sugar, and blood cholesterol level. Reducing the risk of stroke and cardiovascular disease also helps promoting better memory. 5. Avoid stressful situations. Live a simple life and avoid aggravations. Organize your time and prepare for the next day in anticipation. 6. Sleep well, avoid any interruptions of sleep and avoid any distractions in the bedroom that may interfere with adequate sleep quality 7. Avoid sugar, avoid sweets as there is a strong link between excessive sugar intake, diabetes, and cognitive impairment We discussed the Mediterranean diet, which has been shown to help patients reduce the risk of progressive memory disorders and reduces cardiovascular risk. This includes eating fish, eat fruits and green leafy vegetables, nuts like almonds and hazelnuts, walnuts, and also use olive oil. Avoid fast foods and fried foods as much as possible. Avoid sweets and sugar as sugar use has been linked to worsening of memory function.  There is always a concern of gradual progression of memory problems. If this is the case, then we may need to adjust level of care according to patient needs. Support, both to the patient and caregiver, should then be put into place.          FALL PRECAUTIONS: Be cautious  when walking. Scan the area for obstacles that may increase the risk of trips and falls. When getting up in the mornings, sit up at the edge of the bed for a few minutes before getting out of bed. Consider elevating the bed at the head end to avoid drop of blood pressure when getting up. Walk always in a well-lit room (use night lights in the walls). Avoid area rugs or power cords from appliances in the middle of the walkways. Use a walker or a cane if necessary and consider physical therapy for balance exercise. Get your eyesight checked regularly.  FINANCIAL OVERSIGHT: Supervision, especially oversight when making financial decisions or transactions is also recommended.  HOME SAFETY: Consider the safety of the kitchen when operating appliances like stoves, microwave oven, and blender. Consider having supervision and share cooking responsibilities until no longer able to participate in those. Accidents with firearms and other hazards in the house should be identified and addressed as well.   ABILITY TO BE LEFT ALONE: If patient is unable to contact 911 operator, consider using LifeLine, or when the need is there, arrange for someone to stay with patients. Smoking is a fire hazard, consider supervision or cessation. Risk of wandering should be assessed by caregiver and if detected at any point, supervision and safe proof recommendations should be instituted.  MEDICATION SUPERVISION: Inability to self-administer medication needs to be constantly addressed. Implement a mechanism to ensure safe administration of the medications.      Mediterranean Diet A Mediterranean diet refers to food and lifestyle choices that are based on the traditions of countries located on the [employer]. This way of eating has  been shown to help prevent certain conditions and improve outcomes for people who have chronic diseases, like kidney disease and heart disease. What are tips for following this plan? Lifestyle   Cook and eat meals together with your family, when possible. Drink enough fluid to keep your urine clear or pale yellow. Be physically active every day. This includes: Aerobic exercise like running or swimming. Leisure activities like gardening, walking, or housework. Get 7-8 hours of sleep each night. If recommended by your health care provider, drink red wine in moderation. This means 1 glass a day for nonpregnant women and 2 glasses a day for men. A glass of wine equals 5 oz (150 mL). Reading food labels  Check the serving size of packaged foods. For foods such as rice and pasta, the serving size refers to the amount of cooked product, not dry. Check the total fat in packaged foods. Avoid foods that have saturated fat or trans fats. Check the ingredients list for added sugars, such as corn syrup. Shopping  At the grocery store, buy most of your food from the areas near the walls of the store. This includes: Fresh fruits and vegetables (produce). Grains, beans, nuts, and seeds. Some of these may be available in unpackaged forms or large amounts (in bulk). Fresh seafood. Poultry and eggs. Low-fat dairy products. Buy whole ingredients instead of prepackaged foods. Buy fresh fruits and vegetables in-season from local farmers markets. Buy frozen fruits and vegetables in resealable bags. If you do not have access to quality fresh seafood, buy precooked frozen shrimp or canned fish, such as tuna, salmon, or sardines. Buy small amounts of raw or cooked vegetables, salads, or olives from the deli or salad bar at your store. Stock your pantry so you always have certain foods on hand, such as olive oil, canned tuna, canned tomatoes, rice, pasta, and beans. Cooking  Cook foods with extra-virgin olive oil instead of using butter or other vegetable oils. Have meat as a side dish, and have vegetables or grains as your main dish. This means having meat in small portions or adding small amounts of meat  to foods like pasta or stew. Use beans or vegetables instead of meat in common dishes like chili or lasagna. Experiment with different cooking methods. Try roasting or broiling vegetables instead of steaming or sauteing them. Add frozen vegetables to soups, stews, pasta, or rice. Add nuts or seeds for added healthy fat at each meal. You can add these to yogurt, salads, or vegetable dishes. Marinate fish or vegetables using olive oil, lemon juice, garlic, and fresh herbs. Meal planning  Plan to eat 1 vegetarian meal one day each week. Try to work up to 2 vegetarian meals, if possible. Eat seafood 2 or more times a week. Have healthy snacks readily available, such as: Vegetable sticks with hummus. Greek yogurt. Fruit and nut trail mix. Eat balanced meals throughout the week. This includes: Fruit: 2-3 servings a day Vegetables: 4-5 servings a day Low-fat dairy: 2 servings a day Fish, poultry, or lean meat: 1 serving a day Beans and legumes: 2 or more servings a week Nuts and seeds: 1-2 servings a day Whole grains: 6-8 servings a day Extra-virgin olive oil: 3-4 servings a day Limit red meat and sweets to only a few servings a month What are my food choices? Mediterranean diet Recommended Grains: Whole-grain pasta. Brown rice. Bulgar wheat. Polenta. Couscous. Whole-wheat bread. [place] Made[place]. Vegetables: Artichokes. Beets. Broccoli. Cabbage. Carrots. Eggplant. Green beans. Chard. Kale. Spinach. Onions.  Leeks. Peas. Squash. Tomatoes. Peppers. Radishes. Fruits: Apples. Apricots. Avocado. Berries. Bananas. Cherries. Dates. Figs. Grapes. Lemons. Melon. Oranges. Peaches. Plums. Pomegranate. Meats and other protein foods: Beans. Almonds. Sunflower seeds. Pine nuts. Peanuts. Cod. Salmon. Scallops. Shrimp. Tuna. Tilapia. Clams. Oysters. Eggs. Dairy: Low-fat milk. Cheese. Greek yogurt. Beverages: Water. Red wine. Herbal tea. Fats and oils: Extra virgin olive oil. Avocado oil. Grape seed  oil. Sweets and desserts: Greek yogurt with honey. Baked apples. Poached pears. Trail mix. Seasoning and other foods: Basil. Cilantro. Coriander. Cumin. Mint. Parsley. Sage. Rosemary. Tarragon. Garlic. Oregano. Thyme. Pepper. Balsalmic vinegar. Tahini. Hummus. Tomato sauce. Olives. Mushrooms. Limit these Grains: Prepackaged pasta or rice dishes. Prepackaged cereal with added sugar. Vegetables: Deep fried potatoes (french fries). Fruits: Fruit canned in syrup. Meats and other protein foods: Beef. Pork. Lamb. Poultry with skin. Hot dogs. [place]. Dairy: Ice cream. Sour cream. Whole milk. Beverages: Juice. Sugar-sweetened soft drinks. Beer. Liquor and spirits. Fats and oils: Butter. Canola oil. Vegetable oil. Beef fat (tallow). Lard. Sweets and desserts: Cookies. Cakes. Pies. Candy. Seasoning and other foods: Mayonnaise. Premade sauces and marinades. The items listed may not be a complete list. Talk with your dietitian about what dietary choices are right for you. Summary The Mediterranean diet includes both food and lifestyle choices. Eat a variety of fresh fruits and vegetables, beans, nuts, seeds, and whole grains. Limit the amount of red meat and sweets that you eat. Talk with your health care provider about whether it is safe for you to drink red wine in moderation. This means 1 glass a day for nonpregnant women and 2 glasses a day for men. A glass of wine equals 5 oz (150 mL). This information is not intended to replace advice given to you by your health care provider. Make sure you discuss any questions you have with your health care provider. Document Released: 07/21/2016 Document Revised: 08/23/2016 Document Reviewed: 07/21/2016 Elsevier Interactive Patient Education  2017 [employer].

## 2024-12-17 ENCOUNTER — Ambulatory Visit: Admitting: Family Medicine

## 2024-12-17 ENCOUNTER — Encounter: Payer: Self-pay | Admitting: Family Medicine

## 2024-12-17 VITALS — BP 136/80 | HR 54 | Ht 65.0 in | Wt 162.0 lb

## 2024-12-17 DIAGNOSIS — R350 Frequency of micturition: Secondary | ICD-10-CM

## 2024-12-17 DIAGNOSIS — N3941 Urge incontinence: Secondary | ICD-10-CM | POA: Diagnosis not present

## 2024-12-17 NOTE — Progress Notes (Addendum)
" ° ° °  SUBJECTIVE:   CHIEF COMPLAINT / HPI:   Presents for concerns of urge incontinence. Reports sometimes he has the urge to go and does not have enough time to make it to the bathroom. Always feels the need to go, never had any accidents where he did not know he was going. Denies any dribbling or worsening of symptoms with coughing or laughing. Denies any pain with urination, suprapubic pain, or unintentional weight loss. Does well overnight and does not have any bed wetting episodes. Is taking tamulosin 0.4mg  nightly without concern for dizziness.   He believes once a day but daughter believes multiple times a day  No wetting bed overnight,   Denies burning with urination. No abfominal pain. Takes tamulosin 0.4 mg nightly     PERTINENT  PMH / PSH: some concern for orthostatic hypotension and dizziness in the past, NSTEMI, stenosis of iliac artery   OBJECTIVE:   BP 136/80   Pulse (!) 54   Ht 5' 5 (1.651 m)   Wt 162 lb (73.5 kg)   SpO2 99%   BMI 26.96 kg/m   General: A&O, NAD HEENT: No sign of trauma, EOM grossly intact Respiratory: normal WOB GI: non-distended, no TTP Extremities: no peripheral edema. Neuro: Normal gait, moves all four extremities appropriately Skin: no lesions/rashes visualized Psych: Appropriate mood and affect   ASSESSMENT/PLAN:   Assessment & Plan Urge incontinence Patient with ongoing urge incontinence. Multiple episodes of not making it to the bathroom on time. Low concern for UTI given no dysuria, suprapubic pain and chronicity of symptoms. Less concerned for malignancy given no weight loss or pain. [place]  ISI as above, Currently on flomax  0.4 mg nightly. Discussed increasing dose to 0.8 mg nightly but given his history hypotension and dizziness, feel this may be too much of a fall risk. Also discussed perhaps decreasing his imdur  but has history of NSTEMI and is managed by cardiology so do not feel that is a good option at this time. Given  patients history of dementia, difficult to keep a urine log or work on bladder training but given bladder training resources, daughter reports she will work to log how often he is going and work on engineer, maintenance (it). - continue to monitor symptoms with [place]  ISI  - bladder training - referral to urology - consider PSA at next visit    Gloriann Ogren, MD [facility] "

## 2024-12-17 NOTE — Patient Instructions (Addendum)
 It was wonderful to see you today.  Please bring ALL of your medications with you to every visit.   Today we talked about:  Your urinary urge incontinence: I will not increase your medication at this time. I will send a referral to urology    Thank you for choosing [facility].   Please call [phone] with any questions about today's appointment.  Please arrive at least 15 minutes prior to your scheduled appointments.   If you had blood work today, I will send you a MyChart message or a letter if results are normal. Otherwise, I will give you a call.   If you had a referral placed, they will call you to set up an appointment. Please give us  a call if you don't hear back in the next 2 weeks.   If you need additional refills before your next appointment, please call your pharmacy first.   Do you need your medications delivered to your home?   Well send your prescription to the [facility] for delivery.          Address: [address] [place], [place], [place] [address]          Phone: [phone]  Please call the [facility] to speak with a pharmacist and set up your home medication delivery. If you have any questions, feel free to contact us  -- were happy to help!  Other [place] [facility] that offer affordable prices on both prescriptions and over-the-counter items, as well as convenient services like vaccinations, are  [facility], at [facility]         Address:  [address], [place], [place] [address]         Phone: [phone]  [facility], located in the [facility]        Address: [address], [place], [place] [address]        Phone: [phone]  [facility], at [facility]       Address: [address], [place], [place] [address]       Phone: [phone]  [facility], at [facility]        Address: [address], [place], [place] [address]       Phone: [phone]  You should follow up in our clinic in [place] in about 4 weeks (around 01/14/2025).  Gloriann Ogren, MD Family Medicine

## 2024-12-22 ENCOUNTER — Other Ambulatory Visit: Payer: Self-pay | Admitting: Family Medicine

## 2024-12-22 DIAGNOSIS — N400 Enlarged prostate without lower urinary tract symptoms: Secondary | ICD-10-CM

## 2024-12-22 DIAGNOSIS — R350 Frequency of micturition: Secondary | ICD-10-CM

## 2024-12-31 ENCOUNTER — Other Ambulatory Visit: Payer: Self-pay | Admitting: *Deleted

## 2024-12-31 MED ORDER — PRAVASTATIN SODIUM 80 MG PO TABS: 80.0000 mg | ORAL_TABLET | Freq: Every day | ORAL | 3 refills | Status: AC

## 2025-06-11 ENCOUNTER — Ambulatory Visit: Payer: Self-pay | Admitting: Physician Assistant
